# Patient Record
Sex: Female | Born: 1995 | Race: Black or African American | Hispanic: No | Marital: Single | Smoking: Former smoker
Health system: Southern US, Community
[De-identification: ages and names within clinical notes are randomized; demographics above are authoritative.]

## PROBLEM LIST (undated history)

## (undated) ENCOUNTER — Inpatient Hospital Stay (HOSPITAL_COMMUNITY): Payer: Self-pay

## (undated) DIAGNOSIS — J302 Other seasonal allergic rhinitis: Secondary | ICD-10-CM

## (undated) DIAGNOSIS — E0590 Thyrotoxicosis, unspecified without thyrotoxic crisis or storm: Secondary | ICD-10-CM

## (undated) DIAGNOSIS — L910 Hypertrophic scar: Secondary | ICD-10-CM

## (undated) DIAGNOSIS — R002 Palpitations: Secondary | ICD-10-CM

## (undated) DIAGNOSIS — F32A Depression, unspecified: Secondary | ICD-10-CM

## (undated) DIAGNOSIS — K9041 Non-celiac gluten sensitivity: Secondary | ICD-10-CM

## (undated) DIAGNOSIS — R339 Retention of urine, unspecified: Secondary | ICD-10-CM

## (undated) DIAGNOSIS — N390 Urinary tract infection, site not specified: Secondary | ICD-10-CM

## (undated) DIAGNOSIS — I491 Atrial premature depolarization: Secondary | ICD-10-CM

## (undated) DIAGNOSIS — E739 Lactose intolerance, unspecified: Secondary | ICD-10-CM

## (undated) DIAGNOSIS — R0602 Shortness of breath: Secondary | ICD-10-CM

## (undated) DIAGNOSIS — F329 Major depressive disorder, single episode, unspecified: Secondary | ICD-10-CM

## (undated) DIAGNOSIS — K5900 Constipation, unspecified: Secondary | ICD-10-CM

## (undated) DIAGNOSIS — E039 Hypothyroidism, unspecified: Secondary | ICD-10-CM

## (undated) DIAGNOSIS — I493 Ventricular premature depolarization: Secondary | ICD-10-CM

## (undated) HISTORY — DX: Atrial premature depolarization: I49.1

## (undated) HISTORY — DX: Shortness of breath: R06.02

## (undated) HISTORY — DX: Ventricular premature depolarization: I49.3

## (undated) HISTORY — DX: Retention of urine, unspecified: R33.9

## (undated) HISTORY — DX: Constipation, unspecified: K59.00

## (undated) HISTORY — PX: NO PAST SURGERIES: SHX2092

## (undated) HISTORY — DX: Palpitations: R00.2

---
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
## 2017-01-01 ENCOUNTER — Ambulatory Visit (INDEPENDENT_AMBULATORY_CARE_PROVIDER_SITE_OTHER): Payer: Medicaid Other | Admitting: Certified Nurse Midwife

## 2017-01-01 VITALS — BP 122/69 | HR 93 | Wt 180.0 lb

## 2017-01-01 DIAGNOSIS — Z3403 Encounter for supervision of normal first pregnancy, third trimester: Secondary | ICD-10-CM

## 2017-01-01 DIAGNOSIS — R7989 Other specified abnormal findings of blood chemistry: Secondary | ICD-10-CM

## 2017-01-01 DIAGNOSIS — Z3400 Encounter for supervision of normal first pregnancy, unspecified trimester: Secondary | ICD-10-CM

## 2017-01-01 NOTE — Progress Notes (Signed)
Patient reports she may have lost her mucus plug- otherwise things are normal

## 2017-01-01 NOTE — Progress Notes (Signed)
   PRENATAL VISIT NOTE  Subjective:  Ashley Sparks is a 21 y.o. G1P0000 at 100w2d being seen today for ongoing prenatal care.  She is currently monitored for the following issues for this low-risk pregnancy and has Lactose malabsorption; Supervision of normal pregnancy, antepartum; Low vitamin D level; Subclinical hyperthyroidism; PAC (premature atrial contraction); PVC (premature ventricular contraction); Maternal iron deficiency anemia affecting pregnancy in third trimester, antepartum; and Anemia complicating pregnancy in third trimester on her problem list.  Patient reports no complaints.  Contractions: Irregular. Vag. Bleeding: None.  Movement: Present. Denies leaking of fluid.   The following portions of the patient's history were reviewed and updated as appropriate: allergies, current medications, past family history, past medical history, past social history, past surgical history and problem list. Problem list updated.  Objective:   Vitals:   01/01/17 1008  BP: 122/69  Pulse: 93  Weight: 180 lb (81.6 kg)    Fetal Status: Fetal Heart Rate (bpm): 131 Fundal Height: 37 cm Movement: Present  Presentation: Vertex  General:  Alert, oriented and cooperative. Patient is in no acute distress.  Skin: Skin is warm and dry. No rash noted.   Cardiovascular: Normal heart rate noted  Respiratory: Normal respiratory effort, no problems with respiration noted  Abdomen: Soft, gravid, appropriate for gestational age.  Pain/Pressure: Present     Pelvic: Cervical exam performed Dilation: Fingertip Effacement (%): 50 Station: -3  Extremities: Normal range of motion.  Edema: None  Mental Status:  Normal mood and affect. Normal behavior. Normal judgment and thought content.   Assessment and Plan:  Pregnancy: G1P0000 at [redacted]w[redacted]d  1. Supervision of normal first pregnancy, antepartum     Doing well.   2. Low vitamin D level     Taking weekly vitamin D  Term labor symptoms and general obstetric  precautions including but not limited to vaginal bleeding, contractions, leaking of fluid and fetal movement were reviewed in detail with the patient. Please refer to After Visit Summary for other counseling recommendations.  Return in about 1 week (around 01/08/2017) for ROB.   Roe Coombs, CNM

## 2017-01-05 ENCOUNTER — Ambulatory Visit (INDEPENDENT_AMBULATORY_CARE_PROVIDER_SITE_OTHER): Payer: Medicaid Other | Admitting: Certified Nurse Midwife

## 2017-01-05 ENCOUNTER — Inpatient Hospital Stay (HOSPITAL_COMMUNITY)
Admission: AD | Admit: 2017-01-05 | Discharge: 2017-01-05 | Disposition: A | Discharge status: Home / Self Care | Payer: Medicaid Other | Source: Ambulatory Visit | Attending: Family Medicine | Admitting: Family Medicine

## 2017-01-05 ENCOUNTER — Encounter (HOSPITAL_COMMUNITY): Payer: Self-pay

## 2017-01-05 VITALS — BP 112/60 | HR 81 | Wt 180.8 lb

## 2017-01-05 DIAGNOSIS — Z3490 Encounter for supervision of normal pregnancy, unspecified, unspecified trimester: Secondary | ICD-10-CM | POA: Diagnosis not present

## 2017-01-05 DIAGNOSIS — Z3A00 Weeks of gestation of pregnancy not specified: Secondary | ICD-10-CM | POA: Insufficient documentation

## 2017-01-05 DIAGNOSIS — E559 Vitamin D deficiency, unspecified: Secondary | ICD-10-CM

## 2017-01-05 DIAGNOSIS — O99013 Anemia complicating pregnancy, third trimester: Secondary | ICD-10-CM

## 2017-01-05 DIAGNOSIS — Z3400 Encounter for supervision of normal first pregnancy, unspecified trimester: Secondary | ICD-10-CM

## 2017-01-05 DIAGNOSIS — D509 Iron deficiency anemia, unspecified: Secondary | ICD-10-CM

## 2017-01-05 DIAGNOSIS — O479 False labor, unspecified: Secondary | ICD-10-CM

## 2017-01-05 DIAGNOSIS — R7989 Other specified abnormal findings of blood chemistry: Secondary | ICD-10-CM

## 2017-01-05 LAB — POCT FERN TEST: POCT Fern Test: NEGATIVE

## 2017-01-05 NOTE — Discharge Instructions (Signed)

## 2017-01-05 NOTE — MAU Note (Signed)
CTX every 3-5 mins.  Some spotting since being checked in the office earlier.  VE 1 cm earlier.  No problems with the pregnancy.  Reports good fetal movement.  Felt pressure then gush when using restroom earlier.

## 2017-01-05 NOTE — MAU Note (Signed)
I have communicated with Dr. Frances Furbish and reviewed vital signs:  Vitals:   01/05/17 2124 01/05/17 2302  BP: 118/65 125/76  Pulse: 91 94  Resp: 19 17  Temp: 98 F (36.7 C)   SpO2:      Vaginal exam:  Dilation: 1 Effacement (%): 80 Cervical Position: Anterior Station: -1 Presentation: Vertex Exam by:: Corine Shelter,   Also reviewed contraction pattern and that non-stress test is reactive.  It has been documented that patient is contracting every 7-9 minutes with no cervical change over 1.5 hours not indicating active labor.  Patient denies any other complaints.  Based on this report provider has given order for discharge.  A discharge order and diagnosis entered by a provider.   Labor discharge instructions reviewed with patient.

## 2017-01-05 NOTE — Progress Notes (Signed)
   PRENATAL VISIT NOTE  Subjective:  Ashley Sparks is a 21 y.o. G1P0000 at [redacted]w[redacted]d being seen today for ongoing prenatal care.  She is currently monitored for the following issues for this low-risk pregnancy and has Lactose malabsorption; Supervision of normal pregnancy, antepartum; Low vitamin D level; Subclinical hyperthyroidism; PAC (premature atrial contraction); PVC (premature ventricular contraction); Maternal iron deficiency anemia affecting pregnancy in third trimester, antepartum; and Anemia complicating pregnancy in third trimester on her problem list.  Patient reports no bleeding, no leaking and occasional contractions.  Contractions: Irritability. Vag. Bleeding: None.  Movement: Present. Denies leaking of fluid.   The following portions of the patient's history were reviewed and updated as appropriate: allergies, current medications, past family history, past medical history, past social history, past surgical history and problem list. Problem list updated.  Objective:   Vitals:   01/05/17 0935  BP: 112/60  Pulse: 81  Weight: 180 lb 12.8 oz (82 kg)    Fetal Status: Fetal Heart Rate (bpm): 139 Fundal Height: 39 cm Movement: Present  Presentation: Vertex  General:  Alert, oriented and cooperative. Patient is in no acute distress.  Skin: Skin is warm and dry. No rash noted.   Cardiovascular: Normal heart rate noted  Respiratory: Normal respiratory effort, no problems with respiration noted  Abdomen: Soft, gravid, appropriate for gestational age.  Pain/Pressure: Present     Pelvic: Cervical exam performed Dilation: 1 Effacement (%): Thick Station: -3; psoterior, firm  Extremities: Normal range of motion.  Edema: None  Mental Status:  Normal mood and affect. Normal behavior. Normal judgment and thought content.   Assessment and Plan:  Pregnancy: G1P0000 at [redacted]w[redacted]d  1. Supervision of normal first pregnancy, antepartum       Doing well.   2. Low vitamin D level     Taking  weekly vitamin D.   3. Maternal iron deficiency anemia affecting pregnancy in third trimester, antepartum     Followed by hematology; s/p iron transfusion.   Term labor symptoms and general obstetric precautions including but not limited to vaginal bleeding, contractions, leaking of fluid and fetal movement were reviewed in detail with the patient. Please refer to After Visit Summary for other counseling recommendations.  Return in about 1 week (around 01/12/2017) for ROB.   Roe Coombs, CNM

## 2017-01-06 ENCOUNTER — Telehealth: Payer: Self-pay

## 2017-01-06 ENCOUNTER — Other Ambulatory Visit (HOSPITAL_BASED_OUTPATIENT_CLINIC_OR_DEPARTMENT_OTHER): Payer: Medicaid Other

## 2017-01-06 DIAGNOSIS — D509 Iron deficiency anemia, unspecified: Secondary | ICD-10-CM | POA: Diagnosis not present

## 2017-01-06 DIAGNOSIS — O99013 Anemia complicating pregnancy, third trimester: Secondary | ICD-10-CM | POA: Diagnosis not present

## 2017-01-06 LAB — CBC & DIFF AND RETIC
BASO%: 0.7 % (ref 0.0–2.0)
Basophils Absolute: 0.1 10*3/uL (ref 0.0–0.1)
EOS ABS: 0.2 10*3/uL (ref 0.0–0.5)
EOS%: 1.5 % (ref 0.0–7.0)
HEMATOCRIT: 36.8 % (ref 34.8–46.6)
HGB: 11.8 g/dL (ref 11.6–15.9)
IMMATURE RETIC FRACT: 18 % — AB (ref 1.60–10.00)
LYMPH#: 2.3 10*3/uL (ref 0.9–3.3)
LYMPH%: 22.7 % (ref 14.0–49.7)
MCH: 27.4 pg (ref 25.1–34.0)
MCHC: 32.2 g/dL (ref 31.5–36.0)
MCV: 85.2 fL (ref 79.5–101.0)
MONO#: 0.8 10*3/uL (ref 0.1–0.9)
MONO%: 7.5 % (ref 0.0–14.0)
NEUT#: 6.9 10*3/uL — ABNORMAL HIGH (ref 1.5–6.5)
NEUT%: 67.6 % (ref 38.4–76.8)
PLATELETS: 135 10*3/uL — AB (ref 145–400)
RBC: 4.31 10*6/uL (ref 3.70–5.45)
RDW: 30.1 % — ABNORMAL HIGH (ref 11.2–14.5)
RETIC CT ABS: 114.22 10*3/uL — AB (ref 33.70–90.70)
Retic %: 2.65 % — ABNORMAL HIGH (ref 0.70–2.10)
WBC: 10.2 10*3/uL (ref 3.9–10.3)

## 2017-01-06 LAB — IRON AND TIBC
%SAT: 20 % — ABNORMAL LOW (ref 21–57)
IRON: 83 ug/dL (ref 41–142)
TIBC: 420 ug/dL (ref 236–444)
UIBC: 337 ug/dL (ref 120–384)

## 2017-01-06 LAB — COMPREHENSIVE METABOLIC PANEL
ALT: 12 U/L (ref 0–55)
ANION GAP: 6 meq/L (ref 3–11)
AST: 18 U/L (ref 5–34)
Albumin: 2.6 g/dL — ABNORMAL LOW (ref 3.5–5.0)
Alkaline Phosphatase: 106 U/L (ref 40–150)
BILIRUBIN TOTAL: 0.3 mg/dL (ref 0.20–1.20)
BUN: 7.3 mg/dL (ref 7.0–26.0)
CALCIUM: 9 mg/dL (ref 8.4–10.4)
CO2: 20 meq/L — AB (ref 22–29)
CREATININE: 0.6 mg/dL (ref 0.6–1.1)
Chloride: 110 mEq/L — ABNORMAL HIGH (ref 98–109)
Glucose: 82 mg/dl (ref 70–140)
Potassium: 3.8 mEq/L (ref 3.5–5.1)
Sodium: 136 mEq/L (ref 136–145)
TOTAL PROTEIN: 6.2 g/dL — AB (ref 6.4–8.3)

## 2017-01-06 LAB — FERRITIN: Ferritin: 82 ng/ml (ref 9–269)

## 2017-01-06 NOTE — Telephone Encounter (Signed)
"  Franne GripLisa Buno NP with [facility].  My client, a mutual patient of Dr. Gweneth DimitriPerlov is [redacted] weeks pregnant.  Due for iron infusion the day after delivery.  Could deliver at any time within the next week or two.  Calling to let your office know in order to send staff [facility] to infuse the iron infusion.  We also need to know how to get iron [facility] in order for you to give it.  I can be reached [phone]."  [facility] not [facility] outside [facility].  This nurse consulted with infusion Charge Nurse.  [facility] policy is for pregnant patients in need of iron infusions are scheduled here, in a bed with monitoring during infusion.  Spoke with collaborative nurse with this information.  This nurse has communicated with Franne GripLisa Buno on yesterday.  No further communication needed or provided by this triage nurse.

## 2017-01-06 NOTE — Telephone Encounter (Signed)
VM was left with our office questioning the protocol about IV iron infusion for pt after giving birth. "Would you be sending a nurse here to assist with infusion?"   Called facility back and said that if her OBGYN wants her to have iron after giving birth that will need to be ordered, administered, and monitored through [facility]. We do not send nurses to other facilities and only administer IV iron at [facility] because of the liability and risk of reaction. IV iron is okay to be administered at [facility] under the authority of the pt's physician there.   [facility] verbalized understanding. If pt continues to require IV iron in the future, we can set her up for f/u and infusion appt here.

## 2017-01-08 ENCOUNTER — Inpatient Hospital Stay (HOSPITAL_COMMUNITY)
Admission: AD | Admit: 2017-01-08 | Discharge: 2017-01-08 | Disposition: A | Discharge status: Home / Self Care | Payer: Medicaid Other | Source: Ambulatory Visit | Attending: Obstetrics & Gynecology | Admitting: Obstetrics & Gynecology

## 2017-01-08 ENCOUNTER — Telehealth: Payer: Self-pay | Admitting: Hematology and Oncology

## 2017-01-08 ENCOUNTER — Encounter (HOSPITAL_COMMUNITY): Payer: Self-pay | Admitting: *Deleted

## 2017-01-08 DIAGNOSIS — O471 False labor at or after 37 completed weeks of gestation: Secondary | ICD-10-CM | POA: Insufficient documentation

## 2017-01-08 DIAGNOSIS — Z3400 Encounter for supervision of normal first pregnancy, unspecified trimester: Secondary | ICD-10-CM

## 2017-01-08 DIAGNOSIS — Z3A39 39 weeks gestation of pregnancy: Secondary | ICD-10-CM | POA: Diagnosis not present

## 2017-01-08 DIAGNOSIS — O479 False labor, unspecified: Secondary | ICD-10-CM

## 2017-01-08 NOTE — Telephone Encounter (Signed)
Left message for patient regarding update in September appointment.

## 2017-01-08 NOTE — MAU Note (Signed)
[facility] .  VE   IN MAU -   2-3   CM.   STRONG UC  SINCE 2300.    DENIES HSV AND  MRSA.  GBS- NEG

## 2017-01-12 ENCOUNTER — Encounter (HOSPITAL_COMMUNITY): Payer: Self-pay

## 2017-01-12 ENCOUNTER — Inpatient Hospital Stay (HOSPITAL_COMMUNITY): Payer: Medicaid Other | Admitting: Anesthesiology

## 2017-01-12 ENCOUNTER — Ambulatory Visit: Payer: Medicaid Other

## 2017-01-12 ENCOUNTER — Ambulatory Visit: Payer: Medicaid Other | Admitting: Hematology and Oncology

## 2017-01-12 ENCOUNTER — Inpatient Hospital Stay (HOSPITAL_COMMUNITY)
Admission: AD | Admit: 2017-01-12 | Discharge: 2017-01-14 | DRG: 775 | Disposition: A | Discharge status: Home / Self Care | Payer: Medicaid Other | Source: Ambulatory Visit | Attending: Family Medicine | Admitting: Family Medicine

## 2017-01-12 DIAGNOSIS — Z3A39 39 weeks gestation of pregnancy: Secondary | ICD-10-CM

## 2017-01-12 DIAGNOSIS — O99214 Obesity complicating childbirth: Secondary | ICD-10-CM | POA: Diagnosis present

## 2017-01-12 DIAGNOSIS — Z68.41 Body mass index (BMI) pediatric, 5th percentile to less than 85th percentile for age: Secondary | ICD-10-CM | POA: Diagnosis not present

## 2017-01-12 DIAGNOSIS — D509 Iron deficiency anemia, unspecified: Secondary | ICD-10-CM | POA: Diagnosis present

## 2017-01-12 DIAGNOSIS — E669 Obesity, unspecified: Secondary | ICD-10-CM | POA: Diagnosis present

## 2017-01-12 DIAGNOSIS — O9902 Anemia complicating childbirth: Secondary | ICD-10-CM | POA: Diagnosis present

## 2017-01-12 LAB — TYPE AND SCREEN
ABO/RH(D): A POS
Antibody Screen: NEGATIVE

## 2017-01-12 LAB — CBC
HCT: 37.7 % (ref 36.0–46.0)
Hemoglobin: 12.4 g/dL (ref 12.0–15.0)
MCH: 27.8 pg (ref 26.0–34.0)
MCHC: 32.9 g/dL (ref 30.0–36.0)
MCV: 84.5 fL (ref 78.0–100.0)
PLATELETS: 155 10*3/uL (ref 150–400)
RBC: 4.46 MIL/uL (ref 3.87–5.11)
WBC: 18.3 10*3/uL — AB (ref 4.0–10.5)

## 2017-01-12 LAB — RPR: RPR: NONREACTIVE

## 2017-01-12 LAB — ABO/RH: ABO/RH(D): A POS

## 2017-01-12 MED ORDER — LACTATED RINGERS IV SOLN
INTRAVENOUS | Status: DC
  Administered 2017-01-12 (×3): via INTRAVENOUS

## 2017-01-12 MED ORDER — EPHEDRINE 5 MG/ML INJ
10.0000 mg | INTRAVENOUS | Status: DC | PRN
  Filled 2017-01-12: qty 2

## 2017-01-12 MED ORDER — OXYTOCIN 40 UNITS IN LACTATED RINGERS INFUSION - SIMPLE MED
2.5000 [IU]/h | INTRAVENOUS | Status: DC
  Filled 2017-01-12: qty 1000

## 2017-01-12 MED ORDER — PHENYLEPHRINE 40 MCG/ML (10ML) SYRINGE FOR IV PUSH (FOR BLOOD PRESSURE SUPPORT)
80.0000 ug | PREFILLED_SYRINGE | INTRAVENOUS | Status: DC | PRN
  Administered 2017-01-12 (×2): 80 ug via INTRAVENOUS
  Filled 2017-01-12: qty 5
  Filled 2017-01-12: qty 10

## 2017-01-12 MED ORDER — FENTANYL 2.5 MCG/ML BUPIVACAINE 1/10 % EPIDURAL INFUSION (WH - ANES)
14.0000 mL/h | INTRAMUSCULAR | Status: DC | PRN
  Administered 2017-01-12: 14 mL/h via EPIDURAL
  Administered 2017-01-12: 11 mL/h via EPIDURAL
  Filled 2017-01-12 (×2): qty 100

## 2017-01-12 MED ORDER — SOD CITRATE-CITRIC ACID 500-334 MG/5ML PO SOLN: 30.0000 mL | ORAL | Status: DC | PRN

## 2017-01-12 MED ORDER — OXYCODONE-ACETAMINOPHEN 5-325 MG PO TABS
1.0000 | ORAL_TABLET | ORAL | Status: DC | PRN
  Administered 2017-01-14: 1 via ORAL
  Filled 2017-01-12: qty 1

## 2017-01-12 MED ORDER — LACTATED RINGERS IV SOLN
500.0000 mL | Freq: Once | INTRAVENOUS | Status: AC
  Administered 2017-01-12: 500 mL via INTRAVENOUS

## 2017-01-12 MED ORDER — IBUPROFEN 600 MG PO TABS
600.0000 mg | ORAL_TABLET | Freq: Four times a day (QID) | ORAL | Status: DC
  Administered 2017-01-12 – 2017-01-14 (×7): 600 mg via ORAL
  Filled 2017-01-12 (×7): qty 1

## 2017-01-12 MED ORDER — OXYCODONE-ACETAMINOPHEN 5-325 MG PO TABS
2.0000 | ORAL_TABLET | ORAL | Status: DC | PRN
Start: 2017-01-12 — End: 2017-01-14

## 2017-01-12 MED ORDER — LIDOCAINE HCL (PF) 1 % IJ SOLN
30.0000 mL | INTRAMUSCULAR | Status: DC | PRN
  Administered 2017-01-12: 30 mL via SUBCUTANEOUS
  Filled 2017-01-12: qty 30

## 2017-01-12 MED ORDER — LIDOCAINE HCL (PF) 1 % IJ SOLN
INTRAMUSCULAR | Status: DC | PRN
  Administered 2017-01-12: 4 mL via EPIDURAL
  Administered 2017-01-12: 3 mL via EPIDURAL

## 2017-01-12 MED ORDER — OXYTOCIN BOLUS FROM INFUSION
500.0000 mL | Freq: Once | INTRAVENOUS | Status: AC
  Administered 2017-01-12: 500 mL via INTRAVENOUS

## 2017-01-12 MED ORDER — LACTATED RINGERS IV SOLN
500.0000 mL | INTRAVENOUS | Status: DC | PRN
  Administered 2017-01-12: 500 mL via INTRAVENOUS

## 2017-01-12 MED ORDER — OXYTOCIN 40 UNITS IN LACTATED RINGERS INFUSION - SIMPLE MED
1.0000 m[IU]/min | INTRAVENOUS | Status: DC
  Administered 2017-01-12: 2 m[IU]/min via INTRAVENOUS

## 2017-01-12 MED ORDER — TERBUTALINE SULFATE 1 MG/ML IJ SOLN
0.2500 mg | Freq: Once | INTRAMUSCULAR | Status: DC | PRN
  Filled 2017-01-12: qty 1

## 2017-01-12 MED ORDER — DIPHENHYDRAMINE HCL 50 MG/ML IJ SOLN: 12.5000 mg | INTRAMUSCULAR | Status: DC | PRN

## 2017-01-12 MED ORDER — PHENYLEPHRINE 40 MCG/ML (10ML) SYRINGE FOR IV PUSH (FOR BLOOD PRESSURE SUPPORT)
80.0000 ug | PREFILLED_SYRINGE | INTRAVENOUS | Status: DC | PRN
Start: 2017-01-12 — End: 2017-01-14
  Filled 2017-01-12: qty 10
  Filled 2017-01-12: qty 5

## 2017-01-12 MED ORDER — ACETAMINOPHEN 325 MG PO TABS
650.0000 mg | ORAL_TABLET | ORAL | Status: DC | PRN
  Administered 2017-01-13: 650 mg via ORAL
  Filled 2017-01-12: qty 2

## 2017-01-12 MED ORDER — ONDANSETRON HCL 4 MG/2ML IJ SOLN
4.0000 mg | Freq: Four times a day (QID) | INTRAMUSCULAR | Status: DC | PRN
  Administered 2017-01-12: 4 mg via INTRAVENOUS
  Filled 2017-01-12: qty 2

## 2017-01-12 MED ORDER — FENTANYL CITRATE (PF) 100 MCG/2ML IJ SOLN
100.0000 ug | INTRAMUSCULAR | Status: DC | PRN
  Administered 2017-01-12 (×2): 100 ug via INTRAVENOUS
  Filled 2017-01-12 (×2): qty 2

## 2017-01-12 NOTE — Progress Notes (Signed)
Patient ID: Lyndal PulleyCierra Spainhower, female   DOB: 02-18-1996, 21 y.o.   MRN: [id number] Feeling pressure Vitals:   01/12/17 1930 01/12/17 2000 01/12/17 2033 01/12/17 2100  BP: 115/63 (!) 79/66 110/78 110/65  Pulse: (!) 107 (!) 113 98 (!) 197  Resp: 16     Temp: 98.1 F (36.7 C)     TempSrc: Oral     SpO2:      Weight:      Height:       FHR stable  Dilation: 10 Dilation Complete Date: 01/12/17 Dilation Complete Time: 2116 Effacement (%): 100 Station: +2 Presentation: Vertex Exam by:: Viona GilmoreS Moyer  Second stage of labor Anticipate SVD

## 2017-01-12 NOTE — MAU Note (Signed)
Pt presents to MAU via EMS c/o ctxs that are apart. Pt denies bleeding or LOF. Pt reports baby was moving a little less than normal but she has felt her move 2times within the last hour now.

## 2017-01-12 NOTE — Anesthesia Preprocedure Evaluation (Signed)
Anesthesia Evaluation  Patient identified by MRN, date of birth, ID band Patient awake    Reviewed: Allergy & Precautions, Patient's Chart, lab work & pertinent test results  Airway Mallampati: III  TM Distance: >3 FB Neck ROM: Full    Dental no notable dental hx. (+) Teeth Intact   Pulmonary shortness of breath and with exertion,    Pulmonary exam normal breath sounds clear to auscultation       Cardiovascular negative cardio ROS Normal cardiovascular exam Rhythm:Regular Rate:Normal     Neuro/Psych PSYCHIATRIC DISORDERS Depression negative neurological ROS     GI/Hepatic Neg liver ROS, GERD  ,  Endo/Other  Hypothyroidism Hyperthyroidism Obesity  Renal/GU negative Renal ROS  negative genitourinary   Musculoskeletal negative musculoskeletal ROS (+)   Abdominal (+) + obese,   Peds  Hematology  (+) anemia ,   Anesthesia Other Findings   Reproductive/Obstetrics (+) Pregnancy                             Anesthesia Physical Anesthesia Plan  ASA: II  Anesthesia Plan: Epidural   Post-op Pain Management:    Induction:   PONV Risk Score and Plan:   Airway Management Planned: Natural Airway  Additional Equipment:   Intra-op Plan:   Post-operative Plan:   Informed Consent: I have reviewed the patients History and Physical, chart, labs and discussed the procedure including the risks, benefits and alternatives for the proposed anesthesia with the patient or authorized representative who has indicated his/her understanding and acceptance.     Plan Discussed with: Anesthesiologist  Anesthesia Plan Comments:         Anesthesia Quick Evaluation

## 2017-01-12 NOTE — Progress Notes (Signed)
Ashley PulleyCierra Sparks is a 21 y.o. G1P0000 at 3510w6d by admitted for latent labor and prolonged decel in MAU prior to admission.  Subjective: Doing well.  S/p epidural and comfortable.  Family at bedside for support.   Objective: BP (!) 111/52   Pulse 89   Temp 98.3 F (36.8 C) (Oral)   Resp 20   Ht 5\' 1"  (1.549 m)   Wt 83.9 kg (185 lb)   LMP 04/06/2016   SpO2 99%   BMI 34.96 kg/m  No intake/output data recorded. Total I/O In: -  Out: 700 [Urine:700]  FHT:  FHR: 120 bpm, variability: moderate,  accelerations:  Present,  decelerations:  Absent UC:   regular, every 3-4 minutes SVE:   Dilation: 2.5 Effacement (%): 90 Station: -1 Exam by:: stinson, Nery Frappier  Labs: Lab Results  Component Value Date   WBC 18.3 (H) 01/12/2017   HGB 12.4 01/12/2017   HCT 37.7 01/12/2017   MCV 84.5 01/12/2017   PLT 155 01/12/2017    Assessment / Plan: Latent labor with no progression x4 hours  Labor: Initiate pitocin augmentation Preeclampsia:  N/A Fetal Wellbeing:  Category I Pain Control:  Epidural I/D:  n/a Anticipated MOD:  NSVD  Jearld LeschMary K Isabelly Kobler 01/12/2017, 3:03 PM

## 2017-01-12 NOTE — Progress Notes (Signed)
Ashley PulleyCierra Sparks is a 21 y.o. G1P0000 at 4945w6d admitted for latent labor with NRFHT  Subjective: Contractions painful - fentanyl helps  Objective: BP 122/80 (BP Location: Right Arm)   Pulse (!) 108   Temp 98.1 F (36.7 C) (Oral)   Resp 20   Ht 5\' 1"  (1.549 m)   Wt 185 lb (83.9 kg)   LMP 04/06/2016   SpO2 99%   BMI 34.96 kg/m  No intake/output data recorded. No intake/output data recorded.  FHT:  FHR: 140s bpm, variability: moderate,  accelerations:  Abscent,  decelerations:  Present variable UC:   irregular, every 3-5 minutes SVE:   Dilation: 4 Effacement (%): 90 Station: -1 Exam by:: Key MD  Labs: Lab Results  Component Value Date   WBC 18.3 (H) 01/12/2017   HGB 12.4 01/12/2017   HCT 37.7 01/12/2017   MCV 84.5 01/12/2017   PLT 155 01/12/2017    Assessment / Plan: Spontaneous labor, progressing normally, epidural when desired  Labor: Progressing normally Fetal Wellbeing:  Category II Pain Control:  IV pain meds I/D:  n/a Anticipated MOD:  NSVD  Levie HeritageJacob J Stinson 01/12/2017, 9:49 AM

## 2017-01-12 NOTE — Progress Notes (Signed)
Patient ID: Lyndal PulleyCierra Stgermain, female   DOB: 04-18-1996, 21 y.o.   MRN: [id number] Appears comfortable Vitals:   01/12/17 1900 01/12/17 1930 01/12/17 2000 01/12/17 2033  BP: 120/67 115/63 (!) 79/66 110/78  Pulse: 95 (!) 107 (!) 113 98  Resp:  16    Temp:  98.1 F (36.7 C)    TempSrc:  Oral    SpO2:      Weight:      Height:       FHR stable and reassuring UCs every 2-3 min Dilation: 5 Effacement (%): 90 Station: 0 Presentation: Vertex Exam by:: middleton rn  Continue to observe

## 2017-01-12 NOTE — Anesthesia Procedure Notes (Signed)
Epidural Patient location during procedure: OB Start time: 01/12/2017 10:29 AM  Staffing Anesthesiologist: Mal AmabileFOSTER, Fedrick Cefalu Performed: anesthesiologist   Preanesthetic Checklist Completed: patient identified, site marked, surgical consent, pre-op evaluation, timeout performed, IV checked, risks and benefits discussed and monitors and equipment checked  Epidural Patient position: sitting Prep: site prepped and draped and DuraPrep Patient monitoring: continuous pulse ox and blood pressure Approach: midline Location: L4-L5 Injection technique: LOR air  Needle:  Needle type: Tuohy  Needle gauge: 17 G Needle length: 9 cm and 9 Needle insertion depth: 5 cm cm Catheter type: closed end flexible Catheter size: 19 Gauge Catheter at skin depth: 10 cm Test dose: negative and Other  Assessment Events: blood not aspirated, injection not painful, no injection resistance, negative IV test and no paresthesia  Additional Notes Patient identified. Risks and benefits discussed including failed block, incomplete  Pain control, post dural puncture headache, nerve damage, paralysis, blood pressure Changes, nausea, vomiting, reactions to medications-both toxic and allergic and post Partum back pain. All questions were answered. Patient expressed understanding and wished to proceed. Sterile technique was used throughout procedure. Epidural site was Dressed with sterile barrier dressing. No paresthesias, signs of intravascular injection Or signs of intrathecal spread were encountered. Attempt x 2 Patient was more comfortable after the epidural was dosed. Please see RN's note for documentation of vital signs and FHR which are stable.

## 2017-01-12 NOTE — Progress Notes (Signed)
Ashley PulleyCierra Sparks is a 21 y.o. G1P0000 at 2567w6d by admitted for latent labor and prolonged decel in MAU prior to admission.  Subjective: Doing well.  Feeling pressure with contractions; pain controlled with epidural.  Family at bedside.  No concerns.  Objective: BP (!) 111/59   Pulse 84   Temp 98.3 F (36.8 C) (Oral)   Resp 20   Ht 5\' 1"  (1.549 m)   Wt 83.9 kg (185 lb)   LMP 04/06/2016   SpO2 99%   BMI 34.96 kg/m  No intake/output data recorded. Total I/O In: -  Out: 1050 [Urine:1050]  FHT:  Baseline 125, moderate variability, +accels, mild variable decels x2  UC:   regular, every 3 minutes SVE:   Dilation: 5 Effacement (%): 90 Station: 0 Exam by:: [employer]middleton rn  Labs: Lab Results  Component Value Date   WBC 18.3 (H) 01/12/2017   HGB 12.4 01/12/2017   HCT 37.7 01/12/2017   MCV 84.5 01/12/2017   PLT 155 01/12/2017    Assessment / Plan: Spontaneous labor, progressing normally  Labor: Progressing on pitocin, AROM performed with return of light meconium fluid, minimal amount of blood on cervical check likely associated with continued cervical change Preeclampsia:  N/A Fetal Wellbeing:  Category 2 Pain Control:  Epidural I/D:  n/a Anticipated MOD:  NSVD  Ashley Sparks 01/12/2017, 6:54 PM

## 2017-01-12 NOTE — H&P (Signed)
Obstetric History and Physical  Ashley Sparks is a 21 y.o. G1P0000 with IUP at [redacted]w[redacted]d presenting for latent labor with deep prolonged decel on MAU monitoring. Patient uncomfortable with contractions presenting every 2-60min. Contractions started at 01:30. Patient states she has been having  regular contractions, none vaginal bleeding, intact membranes, with active fetal movement.    Prenatal Course Source of Care: [place]  with onset of care at 10w weeks Dating: By [place] --->  Estimated Date of Delivery: 01/13/17 Pregnancy complications or risks: Patient Active Problem List   Diagnosis Date Noted  . Anemia complicating pregnancy in third trimester 11/16/2016  . Maternal iron deficiency anemia affecting pregnancy in third trimester, antepartum 10/28/2016  . PAC (premature atrial contraction) 08/24/2016  . PVC (premature ventricular contraction) 08/24/2016  . 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 Low vitamin D level 06/24/2016  . Supervision of normal pregnancy, antepartum 06/22/2016  . 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   Prenatal labs and studies: ABO, Rh: A/Positive/-- (02/12 1049) Antibody: Negative (02/12 1049) Rubella: 3.73 (02/12 1049) RPR: Non Reactive (06/15 1036)  HBsAg: Negative (02/12 1049)  HIV:   Non-Reactive WUJ:WJXBJYNW (08/09 0917) Glucola  normal Genetic screening normal Anatomy US normal  Prenatal Transfer Tool  Maternal Diabetes: No Genetic Screening: Normal Maternal Ultrasounds/Referrals: Normal Fetal Ultrasounds or other Referrals:  None Maternal Substance Abuse:  No Significant Maternal Medications:  None Significant Maternal Lab Results: None  Past Medical History:  Diagnosis Date  . Constipation   . Depression   . Gluten intolerance   . Hypothyroid   . Keloid   . Lactose intolerance   . PAC (premature atrial contraction) 08/24/2016  . Palpitations 07/01/2016  . PVC  (premature ventricular contraction) 08/24/2016  . Seasonal allergies   . Shortness of breath 07/01/2016   typically during allergy season  . UTI (urinary tract infection)     History reviewed. No pertinent surgical history.  OB History  Gravida Para Term Preterm AB Living  1 0 0 0 0 0  SAB TAB Ectopic Multiple Live Births  0 0 0 0 0    # Outcome Date GA Lbr Len/2nd Weight Sex Delivery Anes PTL Lv  1 Current               Social History   Social History  . Marital status: Single    Spouse name: N/A  . Number of children: N/A  . Years of education: N/A   Social History Main Topics  . Smoking status: Never Smoker  . Smokeless tobacco: Never Used  . Alcohol use No  . Drug use: No  . Sexual activity: Yes   Other Topics Concern  . None   Social History Narrative   10th grade (online)    Family History  Problem Relation Age of Onset  . Adopted: Yes  . Diabetes Mother   . Seizures Mother   . Celiac disease Neg Hx   . Cholelithiasis Neg Hx   . Ulcers Neg Hx     Prescriptions Prior to Admission  Medication Sig Dispense Refill Last Dose  . acetaminophen (TYLENOL) 325 MG tablet Take 650 mg by mouth every 6 (six) hours as needed for mild pain or headache.   01/12/2017 at Unknown time  . Prenatal-DSS-FeCb-FeGl-FA (CITRANATAL BLOOM) 90-1 MG TABS Take 1 tablet by mouth daily. 30 tablet 12 01/12/2017 at Unknown time  . promethazine (PHENERGAN) 25 MG tablet Take 12.5-25 mg by mouth every 6 (  six) hours as needed for nausea.    01/12/2017 at Unknown time  . albuterol (PROVENTIL HFA;VENTOLIN HFA) 108 (90 Base) MCG/ACT inhaler Inhale 1-2 puffs into the lungs every 6 (six) hours as needed for wheezing or shortness of breath.   Unknown at Unknown time  . metroNIDAZOLE (FLAGYL) 500 MG tablet Take 1 tablet (500 mg total) by mouth 2 (two) times daily. 14 tablet 0 Unknown at Unknown time  . terconazole (TERAZOL 3) 0.8 % vaginal cream Place 1 applicator vaginally at bedtime. (Patient not  taking: Reported on 01/05/2017) 20 g 0 Not Taking at Unknown time    Allergies  Allergen Reactions  . Lactose Intolerance (Gi) Diarrhea and Other (See Comments)    Gi upset   . Gluten Meal Hives and Diarrhea    And abdominal pain  . Macrobid [Nitrofurantoin Monohyd Macro] Hives and Rash    Review of Systems: Negative except for what is mentioned in HPI.  Physical Exam: BP 138/72 (BP Location: Left Arm)   Pulse 96   Temp 98.2 F (36.8 C) (Oral)   Resp (!) 22   Ht 5\' 1"  (1.549 m)   Wt 185 lb (83.9 kg)   LMP 04/06/2016   SpO2 99%   BMI 34.96 kg/m  CONSTITUTIONAL: Well-developed, well-nourished female in no acute distress.  HENT:  Normocephalic, atraumatic, External right and left ear normal. Oropharynx is clear and moist EYES: Conjunctivae and EOM are normal. Pupils are equal, round, and reactive to light. No scleral icterus.  NECK: Normal range of motion, supple, no masses SKIN: Skin is warm and dry. No rash noted. Not diaphoretic. No erythema. No pallor. NEUROLOGIC: Alert and oriented to person, place, and time. Normal reflexes, muscle tone coordination. No cranial nerve deficit noted. PSYCHIATRIC: Normal mood and affect. Normal behavior. Normal judgment and thought content. CARDIOVASCULAR: Normal heart rate noted, regular rhythm RESPIRATORY: Effort and breath sounds normal, no problems with respiration noted ABDOMEN: Soft, nontender, nondistended, gravid. MUSCULOSKELETAL: Normal range of motion. No edema and no tenderness. 2+ distal pulses.  Dilation: 1.5 Effacement (%): 90 Station: -1 Presentation: Vertex Exam by:: middleton rn  FHT:  Baseline rate 130 bpm   Variability moderate  Accelerations present   Decelerations none Contractions: Every 2-4 mins   Pertinent Labs/Studies:   Results for orders placed or performed during the hospital encounter of 01/12/17 (from the past 24 hour(s))  CBC     Status: Abnormal   Collection Time: 01/12/17  6:50 AM  Result Value Ref  Range   WBC 18.3 (H) 4.0 - 10.5 K/uL   RBC 4.46 3.87 - 5.11 MIL/uL   Hemoglobin 12.4 12.0 - 15.0 g/dL   HCT 09.837.7 11.936.0 - 14.746.0 %   MCV 84.5 78.0 - 100.0 fL   MCH 27.8 26.0 - 34.0 pg   MCHC 32.9 30.0 - 36.0 g/dL   RDW NOT CALCULATED 82.911.5 - 15.5 %   Platelets 155 150 - 400 K/uL  Type and screen [facility]     Status: None   Collection Time: 01/12/17  6:50 AM  Result Value Ref Range   ABO/RH(D) A POS    Antibody Screen NEG    Sample Expiration 01/15/2017   ABO/Rh     Status: None   Collection Time: 01/12/17  6:50 AM  Result Value Ref Range   ABO/RH(D) A POS     Assessment : Ashley PulleyCierra Sparks is a 21 y.o. G1P0000 at 338w6d being admitted for latent labor and NRFHT.  Plan: Labor:  Expectant management. Induction/Augmentation as ordered as per protocol. Analgesia as needed. FWB: Reassuring fetal heart tracing now, category 1.  GBS negative Delivery plan: Hopeful for vaginal delivery   Caryl Ada, DO OB Fellow Faculty Practice, [facility] 01/12/2017, 6:33 AM

## 2017-01-12 NOTE — Anesthesia Pain Management Evaluation Note (Signed)
  CRNA Pain Management Visit Note  Patient: Ashley PulleyCierra Sparks, 21 y.o., female  "Hello I am a member of the anesthesia team at [facility]. We have an anesthesia team available at all times to provide care throughout the hospital, including epidural management and anesthesia for C-section. I don't know your plan for the delivery whether it a natural birth, water birth, IV sedation, nitrous supplementation, doula or epidural, but we want to meet your pain goals."   1.Was your pain managed to your expectations on prior hospitalizations?   No prior hospitalizations  2.What is your expectation for pain management during this hospitalization?     Epidural  3.How can we help you reach that goal? unsure  Record the patient's initial score and the patient's pain goal.   Pain: 10  Pain Goal: 10 [facility] wants you to be able to say your pain was always managed very well.  Cephus ShellingBURGER,[name] 01/12/2017

## 2017-01-13 LAB — CBC
HEMATOCRIT: 33 % — AB (ref 36.0–46.0)
HEMOGLOBIN: 10.8 g/dL — AB (ref 12.0–15.0)
MCH: 27.8 pg (ref 26.0–34.0)
MCHC: 32.7 g/dL (ref 30.0–36.0)
MCV: 84.8 fL (ref 78.0–100.0)
Platelets: 140 10*3/uL — ABNORMAL LOW (ref 150–400)
RBC: 3.89 MIL/uL (ref 3.87–5.11)
WBC: 21.1 10*3/uL — ABNORMAL HIGH (ref 4.0–10.5)

## 2017-01-13 MED ORDER — PRENATAL MULTIVITAMIN CH: 1.0000 | ORAL_TABLET | Freq: Every day | ORAL | Status: DC

## 2017-01-13 MED ORDER — LIDOCAINE HCL 1 % IJ SOLN
0.0000 mL | Freq: Once | INTRAMUSCULAR | Status: DC | PRN
  Filled 2017-01-13: qty 20

## 2017-01-13 MED ORDER — ACETAMINOPHEN 325 MG PO TABS: 650.0000 mg | ORAL_TABLET | ORAL | Status: DC | PRN

## 2017-01-13 MED ORDER — DIBUCAINE 1 % RE OINT: 1.0000 "application " | TOPICAL_OINTMENT | RECTAL | Status: DC | PRN

## 2017-01-13 MED ORDER — ETONOGESTREL 68 MG ~~LOC~~ IMPL
68.0000 mg | DRUG_IMPLANT | Freq: Once | SUBCUTANEOUS | Status: AC
  Administered 2017-01-14: 68 mg via SUBCUTANEOUS
  Filled 2017-01-13: qty 1

## 2017-01-13 MED ORDER — ONDANSETRON HCL 4 MG PO TABS: 4.0000 mg | ORAL_TABLET | ORAL | Status: DC | PRN

## 2017-01-13 MED ORDER — ZOLPIDEM TARTRATE 5 MG PO TABS: 5.0000 mg | ORAL_TABLET | Freq: Every evening | ORAL | Status: DC | PRN

## 2017-01-13 MED ORDER — ONDANSETRON HCL 4 MG/2ML IJ SOLN: 4.0000 mg | INTRAMUSCULAR | Status: DC | PRN

## 2017-01-13 MED ORDER — TETANUS-DIPHTH-ACELL PERTUSSIS 5-2.5-18.5 LF-MCG/0.5 IM SUSP: 0.5000 mL | Freq: Once | INTRAMUSCULAR | Status: DC

## 2017-01-13 MED ORDER — SENNOSIDES-DOCUSATE SODIUM 8.6-50 MG PO TABS
2.0000 | ORAL_TABLET | ORAL | Status: DC
  Administered 2017-01-14: 2 via ORAL
  Filled 2017-01-13: qty 2

## 2017-01-13 MED ORDER — SIMETHICONE 80 MG PO CHEW: 80.0000 mg | CHEWABLE_TABLET | ORAL | Status: DC | PRN

## 2017-01-13 MED ORDER — OXYCODONE-ACETAMINOPHEN 5-325 MG PO TABS
1.0000 | ORAL_TABLET | ORAL | Status: DC | PRN
  Administered 2017-01-13: 1 via ORAL
  Filled 2017-01-13: qty 1

## 2017-01-13 MED ORDER — DIPHENHYDRAMINE HCL 25 MG PO CAPS: 25.0000 mg | ORAL_CAPSULE | Freq: Four times a day (QID) | ORAL | Status: DC | PRN

## 2017-01-13 MED ORDER — OXYCODONE-ACETAMINOPHEN 5-325 MG PO TABS: 2.0000 | ORAL_TABLET | ORAL | Status: DC | PRN

## 2017-01-13 MED ORDER — BENZOCAINE-MENTHOL 20-0.5 % EX AERO
1.0000 "application " | INHALATION_SPRAY | CUTANEOUS | Status: DC | PRN
  Administered 2017-01-13: 1 via TOPICAL
  Filled 2017-01-13: qty 56

## 2017-01-13 MED ORDER — COCONUT OIL OIL
1.0000 "application " | TOPICAL_OIL | Status: DC | PRN
  Administered 2017-01-13: 1 via TOPICAL
  Filled 2017-01-13: qty 120

## 2017-01-13 MED ORDER — WITCH HAZEL-GLYCERIN EX PADS: 1.0000 "application " | MEDICATED_PAD | CUTANEOUS | Status: DC | PRN

## 2017-01-13 NOTE — Lactation Note (Signed)
This note was copied from a baby's chart. Lactation Consultation Note  Patient Name: Ashley Lyndal PulleyCierra Lazare ZOXWR'UToday's Date: 01/13/2017 Reason for consult: Initial assessment   Baby 16 hours old.  Baby sleeping in mother's arms while mother on the phone.  FOB stated baby recently breastfed. Offered to come back later and mother did not respond, remained on the phone. Lactation will follow up later.   Maternal Data    Feeding    LATCH Score                   Interventions    Lactation Tools Discussed/Used     Consult Status Consult Status: Follow-up Date: 01/13/17 Follow-up type: In-patient    Dahlia ByesBerkelhammer, Xandria Gallaga [facility] 01/13/2017, 2:28 PM

## 2017-01-13 NOTE — Lactation Note (Signed)
This note was copied from a baby's chart. Lactation Consultation Note  Patient Name: Girl Lyndal PulleyCierra Propps Today's Date: 01/13/2017 Reason for consult: Initial assessment Baby at 14 hr of life. [facility] and then her lunch was delivered. Left handouts and instructions for mom to call for lactation at the next feeding.   Maternal Data    Feeding Feeding Type: Breast Fed  LATCH Score Latch: Grasps breast easily, tongue down, lips flanged, rhythmical sucking.  Audible Swallowing: Spontaneous and intermittent  Type of Nipple: Everted at rest and after stimulation  Comfort (Breast/Nipple): Soft / non-tender  Hold (Positioning): No assistance needed to correctly position infant at breast.  LATCH Score: 10  Interventions    Lactation Tools Discussed/Used     Consult Status Consult Status: Follow-up Date: 01/13/17 Follow-up type: In-patient    Rulon Eisenmengerlizabeth E Rudie Sermons 01/13/2017, 12:27 PM

## 2017-01-13 NOTE — Progress Notes (Signed)
Post Partum Day 1 Subjective: no complaints, up ad lib, voiding and tolerating PO  Objective: Blood pressure (!) 107/46, pulse 79, temperature 98.6 F (37 C), temperature source Oral, resp. rate 18, height 5\' 1"  (1.549 m), weight 185 lb (83.9 kg), last menstrual period 04/06/2016, SpO2 99 %, unknown if currently breastfeeding.  Physical Exam:  General: alert, cooperative and no distress Lochia: appropriate Uterine Fundus: firm Incision: n/a  DVT Evaluation: No evidence of DVT seen on physical exam.   Recent Labs  01/12/17 0650  HGB 12.4  HCT 37.7    Assessment/Plan: Plan for discharge tomorrow, Breastfeeding and Contraception Nexplanon   LOS: 1 day   Ashley Sparks 01/13/2017, 6:20 AM

## 2017-01-13 NOTE — Progress Notes (Signed)
MOB was referred for history of depression/anxiety. * Referral screened out by Clinical Social Worker because none of the following criteria appear to apply: ~ History of anxiety/depression during this pregnancy, or of post-partum depression. ~ Diagnosis of anxiety and/or depression within last 3 years OR * MOB's symptoms currently being treated with medication and/or therapy.  No concerns not in OB records.  Please contact the Clinical Social Worker if needs arise, by MOB request, or if MOB scores 9 or greater/yes to question 10 on Edinburgh Postpartum Depression Screen.  Yoni Lobos Boyd-Gilyard, MSW, LCSW Clinical Social Work [phone] 

## 2017-01-13 NOTE — Anesthesia Postprocedure Evaluation (Signed)
Anesthesia Post Note  Patient: Lyndal PulleyCierra Levenhagen  Procedure(s) Performed: * No procedures listed *     Patient location during evaluation: Mother Baby Anesthesia Type: Epidural Level of consciousness: awake and alert and oriented Pain management: pain level controlled Vital Signs Assessment: post-procedure vital signs reviewed and stable Respiratory status: spontaneous breathing and nonlabored ventilation Cardiovascular status: stable Postop Assessment: no headache, patient able to bend at knees, no backache, no signs of nausea or vomiting, epidural receding and adequate PO intake Anesthetic complications: no    Last Vitals:  Vitals:   01/13/17 0056 01/13/17 0508  BP: (!) 114/56 (!) 107/46  Pulse: 83 79  Resp: 18 18  Temp: 37 C 37 C  SpO2:      Last Pain:  Vitals:   01/13/17 0508  TempSrc: Oral  PainSc: 3    Pain Goal: Patients Stated Pain Goal: 3 (01/12/17 2000)               Laban EmperorMalinova,Demichael Traum Hristova

## 2017-01-14 ENCOUNTER — Encounter: Payer: Medicaid Other | Admitting: Certified Nurse Midwife

## 2017-01-14 MED ORDER — IBUPROFEN 600 MG PO TABS: 600.0000 mg | ORAL_TABLET | Freq: Four times a day (QID) | ORAL | 0 refills | Status: DC | PRN

## 2017-01-14 NOTE — Discharge Instructions (Signed)

## 2017-01-14 NOTE — Procedures (Signed)
PROCEDURE NOTE: Nexplanon Insertion  Patient given informed consent, signed copy in the chart, time out was performed.  Postpartum day 1.   Patient's right arm was prepped and draped in the usual sterile fashion. The ruler used to measure and mark insertion area. Pt was prepped with alcohol swab and then injected with 1.5 cc of 1% lidocaine with epinephrine used to anesthetize the area.  Pt was prepped with betadine, nexplanon removed from packaging, device confirmed in needle, then inserted full length of needle and withdrawn per manufacturer's instructions. Minimal blood loss. The insertion site covered with a pressure bandage to minimize bruising. There were no complications and the patient tolerated the procedure well.  Device information was given in handout form. Patient is informed the removal date will be in three years and package insert card filled out and given to her.   West PughMary Wallace Gappa, DO PGY-2 01/14/17, [phone]

## 2017-01-14 NOTE — Discharge Summary (Signed)
OB Discharge Summary     Patient Name: Ashley Sparks DOB: 1995-11-15 MRN: [id number]  Date of admission: 01/12/2017 Delivering MD: Levie HeritageSTINSON, JACOB J   Date of discharge: 01/14/2017  Admitting diagnosis: 40 WEEKS CTX Intrauterine pregnancy: 6458w6d     Secondary diagnosis:  Active Problems:   Indication for care in labor or delivery  Additional problems:  Patient Active Problem List   Diagnosis Date Noted  . Indication for care in labor or delivery 01/12/2017  . Anemia complicating pregnancy in third trimester 11/16/2016  . Maternal iron deficiency anemia affecting pregnancy in third trimester, antepartum 10/28/2016  . PAC (premature atrial contraction) 08/24/2016  . PVC (premature ventricular contraction) 08/24/2016  . 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 Low vitamin D level 06/24/2016  . Supervision of normal pregnancy, antepartum 06/22/2016  . 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 yo G1P1001 at 2658w6d was admitted in Latent Labor on 01/12/2017. Patient had an uncomplicated labor course as follows:  Membrane Rupture Time/Date: 6:50 PM ,01/12/2017   Intrapartum Procedures: Episiotomy: None [1]                                         Lacerations:  1st degree [2];Labial [10]  Patient had a delivery of a Viable infant. 01/12/2017  Information for the patient's newborn:  Ashley Sparks, Girl Kimbella [[id number]][[id number]]  Delivery Method: Vaginal, Spontaneous Delivery (Filed from Delivery Summary)    Pateint had an uncomplicated postpartum course.  She is ambulating, tolerating a regular diet, passing flatus, and urinating well. Patient is discharged home in stable condition on 01/14/17.   Physical exam   Vitals:   01/13/17 0508 01/13/17 1112 01/13/17 1725 01/14/17 0551  BP: (!) 107/46 (!) 99/47 116/78 111/69  Pulse: 79 94 82 76  Resp: 18 20 18 18   Temp: 98.6 F (37 C) 98.3 F (36.8 C) 98.2 F (36.8 C) 98.3 F (36.8 C)  TempSrc: Oral Oral Oral Oral  SpO2:      Weight:      Height:       General: alert, cooperative and no distress Lochia: appropriate Uterine Fundus: firm Incision: N/A DVT Evaluation: No evidence of DVT seen on physical exam. Labs: Lab Results  Component Value Date   WBC 21.1 (H) 01/13/2017   HGB 10.8 (L) 01/13/2017   HCT 33.0 (L) 01/13/2017   MCV 84.8 01/13/2017   PLT 140 (L) 01/13/2017   CMP Latest Ref Rng & Units 01/06/2017  Glucose 70 - 140 mg/dl 82  BUN 7.0 - 41.326.0 mg/dL 7.3  Creatinine 0.6 - 1.1 mg/dL 0.6  Sodium 244136 - 010145 mEq/L 136  Potassium 3.5 -  5.1 mEq/L 3.8  Chloride 101 - 111 mmol/L -  CO2 22 - 29 mEq/L 20(L)  Calcium 8.4 - 10.4 mg/dL 9.0  Total Protein 6.4 - 8.3 g/dL 6.2(L)  Total Bilirubin 0.20 - 1.20 mg/dL 0.86  Alkaline Phos 40 - 150 U/L 106  AST 5 - 34 U/L 18  ALT 0 - 55 U/L 12    Discharge instruction: per After Visit Summary and "Baby and Me Booklet".  After visit meds:  Allergies as of 01/14/2017      Reactions   Lactose Intolerance (gi) Diarrhea, Other (See Comments)   Gi upset   Gluten Meal Hives, Diarrhea   And abdominal pain   Macrobid [nitrofurantoin Monohyd Macro] Hives, Rash      Medication List    STOP taking these medications   metroNIDAZOLE 500 MG tablet Commonly known as:  FLAGYL   terconazole 0.8 % vaginal cream Commonly known as:  TERAZOL 3     TAKE these medications   acetaminophen 325 MG tablet Commonly known as:  TYLENOL Take 650 mg by mouth every 6 (six) hours as needed for mild pain or headache.   albuterol 108 (90 Base) MCG/ACT inhaler Commonly known as:  PROVENTIL HFA;VENTOLIN HFA Inhale 1-2 puffs into the lungs every 6 (six) hours as needed for wheezing or shortness of breath.    CITRANATAL BLOOM 90-1 MG Tabs Take 1 tablet by mouth daily.   ibuprofen 600 MG tablet Commonly known as:  ADVIL,MOTRIN Take 1 tablet (600 mg total) by mouth every 6 (six) hours as needed for moderate pain or cramping.   promethazine 25 MG tablet Commonly known as:  PHENERGAN Take 12.5-25 mg by mouth every 6 (six) hours as needed for nausea.            Discharge Care Instructions        Start     Ordered   01/14/17 0000  ibuprofen (ADVIL,MOTRIN) 600 MG tablet  Every 6 hours PRN     01/14/17 1105      Diet: routine diet  Activity: Advance as tolerated. Pelvic rest for 6 weeks.   Outpatient follow up:6 weeks Follow up Appt:Future Appointments Date Time Provider Department Center  01/22/2017 1:00 PM Daisy Blossom, MD [facility] None  01/22/2017 2:00 PM [facility] G22 [facility] None  02/11/2017 1:15 PM Denney, Rachelle A, CNM [facility] None   Follow up Visit:No Follow-up on file.  Postpartum contraception: Nexplanon inserted 01/14/17 prior to discharge  Newborn Data: Live born female  Birth Weight: 7 lb 12.9 oz (3541 g) APGAR: 9, 9  Baby Feeding: Breast Disposition:home with mother   01/14/2017 Ashley Beach, DO PGY-2  CNM attestation I have seen and examined this patient and agree with above documentation in the resident's note.   Ashley Sparks is a 21 y.o. G1P1001 s/p SVD.   Pain is well controlled.  Plan for birth control is s/p Nex insertion.  Method of Feeding: breast  PE:  BP 111/69   Pulse 76   Temp 98.3 F (36.8 C) (Oral)   Resp 18   Ht  (1.549 m)   Wt 83.9 kg (185 lb)   LMP 04/06/2016   SpO2 99%   Breastfeeding? Unknown   BMI 34.96 kg/m  Fundus firm   Recent Labs  01/16/17 0750  HGB 10.9*  HCT 35.0*     Plan: discharge today - postpartum care discussed - f/u clinic in 4 weeks for postpartum visit   Ashley Sparks, CNM 9:55 AM

## 2017-01-14 NOTE — Lactation Note (Signed)
This note was copied from a baby's chart. Lactation Consultation Note  Patient Name: Ashley Lyndal PulleyCierra Giammona ONGEX'BToday's Date: 01/14/2017 Reason for consult: Follow-up assessment Baby at 38 hr of life. Upon entry baby was cueing and mom was agreeable to latch help. She is reporting bilateral sore nipples that coconut oil is not helping. The R areola at the base of the nipple shaft has a thin bright pink crescent line from 8 o'clock to 11 o'clock. The L nipple has a pin sized bright red blister in the center of the nipple. Given comfort gels. Help family get a deeper latch. Discussed baby behavior, feeding frequency, pumping, baby belly size, voids, wt loss, breast changes, and nipple care. Family is aware of lactation services and support group.    Maternal Data    Feeding Feeding Type: Breast Fed Length of feed: 20 min  LATCH Score Latch: Grasps breast easily, tongue down, lips flanged, rhythmical sucking.  Audible Swallowing: Spontaneous and intermittent  Type of Nipple: Everted at rest and after stimulation  Comfort (Breast/Nipple): Filling, red/small blisters or bruises, mild/mod discomfort  Hold (Positioning): Assistance needed to correctly position infant at breast and maintain latch.  LATCH Score: 8  Interventions Interventions: Breast feeding basics reviewed;Assisted with latch;Skin to skin;Breast massage;Hand express;Adjust position;Support pillows;Expressed milk;Coconut oil;Comfort gels  Lactation Tools Discussed/Used WIC Program: Yes   Consult Status Consult Status: Follow-up Date: 01/15/17 Follow-up type: In-patient    Ashley Sparks 01/14/2017, 11:54 AM

## 2017-01-16 ENCOUNTER — Encounter (HOSPITAL_COMMUNITY): Payer: Self-pay

## 2017-01-16 ENCOUNTER — Emergency Department (HOSPITAL_COMMUNITY)
Admission: EM | Admit: 2017-01-16 | Discharge: 2017-01-16 | Disposition: A | Discharge status: Home / Self Care | Payer: Medicaid Other | Attending: Emergency Medicine | Admitting: Emergency Medicine

## 2017-01-16 ENCOUNTER — Emergency Department (HOSPITAL_COMMUNITY): Payer: Medicaid Other

## 2017-01-16 DIAGNOSIS — Z79899 Other long term (current) drug therapy: Secondary | ICD-10-CM | POA: Diagnosis not present

## 2017-01-16 DIAGNOSIS — K5900 Constipation, unspecified: Secondary | ICD-10-CM | POA: Diagnosis not present

## 2017-01-16 DIAGNOSIS — R1030 Lower abdominal pain, unspecified: Secondary | ICD-10-CM

## 2017-01-16 DIAGNOSIS — R339 Retention of urine, unspecified: Secondary | ICD-10-CM | POA: Diagnosis not present

## 2017-01-16 DIAGNOSIS — E039 Hypothyroidism, unspecified: Secondary | ICD-10-CM | POA: Insufficient documentation

## 2017-01-16 LAB — URINALYSIS, ROUTINE W REFLEX MICROSCOPIC

## 2017-01-16 LAB — COMPREHENSIVE METABOLIC PANEL
ALBUMIN: 2.4 g/dL — AB (ref 3.5–5.0)
ALK PHOS: 108 U/L (ref 38–126)
ALT: 31 U/L (ref 14–54)
AST: 32 U/L (ref 15–41)
Anion gap: 8 (ref 5–15)
BILIRUBIN TOTAL: 0.5 mg/dL (ref 0.3–1.2)
BUN: 5 mg/dL — AB (ref 6–20)
CO2: 21 mmol/L — ABNORMAL LOW (ref 22–32)
CREATININE: 0.55 mg/dL (ref 0.44–1.00)
Calcium: 8.8 mg/dL — ABNORMAL LOW (ref 8.9–10.3)
Chloride: 108 mmol/L (ref 101–111)
GFR calc Af Amer: >60 mL/min (ref >60)
GLUCOSE: 99 mg/dL (ref 65–99)
Potassium: 3.2 mmol/L — ABNORMAL LOW (ref 3.5–5.1)
Sodium: 137 mmol/L (ref 135–145)
TOTAL PROTEIN: 5.7 g/dL — AB (ref 6.5–8.1)

## 2017-01-16 LAB — CBC WITH DIFFERENTIAL/PLATELET
Basophils Absolute: 0 10*3/uL (ref 0.0–0.1)
Basophils Relative: 0 %
EOS PCT: 1 %
Eosinophils Absolute: 0.2 10*3/uL (ref 0.0–0.7)
HEMATOCRIT: 35 % — AB (ref 36.0–46.0)
HEMOGLOBIN: 10.9 g/dL — AB (ref 12.0–15.0)
LYMPHS ABS: 1 10*3/uL (ref 0.7–4.0)
LYMPHS PCT: 8 %
MCH: 26.4 pg (ref 26.0–34.0)
MCHC: 31.1 g/dL (ref 30.0–36.0)
MCV: 84.7 fL (ref 78.0–100.0)
Monocytes Absolute: 0.7 10*3/uL (ref 0.1–1.0)
Monocytes Relative: 6 %
NEUTROS ABS: 10.4 10*3/uL — AB (ref 1.7–7.7)
Neutrophils Relative %: 85 %
Platelets: 168 10*3/uL (ref 150–400)
RBC: 4.13 MIL/uL (ref 3.87–5.11)
WBC: 12.2 10*3/uL — AB (ref 4.0–10.5)

## 2017-01-16 LAB — URINALYSIS, MICROSCOPIC (REFLEX): BACTERIA UA: NONE SEEN

## 2017-01-16 LAB — SAMPLE TO BLOOD BANK

## 2017-01-16 MED ORDER — MILK AND MOLASSES ENEMA
1.0000 | Freq: Once | RECTAL | Status: AC
Start: 2017-01-16 — End: 2017-01-16
  Administered 2017-01-16: 250 mL via RECTAL
  Filled 2017-01-16: qty 250

## 2017-01-16 MED ORDER — MORPHINE SULFATE (PF) 4 MG/ML IV SOLN
4.0000 mg | Freq: Once | INTRAVENOUS | Status: AC
  Administered 2017-01-16: 4 mg via INTRAVENOUS
  Filled 2017-01-16: qty 1

## 2017-01-16 MED ORDER — ONDANSETRON HCL 4 MG/2ML IJ SOLN
4.0000 mg | Freq: Once | INTRAMUSCULAR | Status: AC
  Administered 2017-01-16: 4 mg via INTRAVENOUS
  Filled 2017-01-16: qty 2

## 2017-01-16 MED ORDER — IOPAMIDOL (ISOVUE-300) INJECTION 61%
INTRAVENOUS | Status: AC
  Administered 2017-01-16: 100 mL
  Filled 2017-01-16: qty 100

## 2017-01-16 MED ORDER — OXYCODONE-ACETAMINOPHEN 5-325 MG PO TABS: 1.0000 | ORAL_TABLET | Freq: Once | ORAL | Status: DC

## 2017-01-16 MED ORDER — HYDROCODONE-ACETAMINOPHEN 5-325 MG PO TABS: 1.0000 | ORAL_TABLET | Freq: Four times a day (QID) | ORAL | 0 refills | Status: DC | PRN

## 2017-01-16 MED ORDER — SODIUM CHLORIDE 0.9 % IV BOLUS (SEPSIS)
1000.0000 mL | Freq: Once | INTRAVENOUS | Status: AC
  Administered 2017-01-16: 1000 mL via INTRAVENOUS

## 2017-01-16 NOTE — ED Notes (Signed)
Collected urine sample from patient, patient family is at bedside call bell in reach patient is resting

## 2017-01-16 NOTE — ED Notes (Signed)
Patient able to ambulate independently  

## 2017-01-16 NOTE — Discharge Instructions (Signed)
Take miralax one cap every hour for 3 doses tonight to help with constipation. You can also try ducolax suppositories. Take tylenol or motrin for pain. Take norco for severe pain only. Follow up tomorrow with OB/GYN  in MAU for recheck.

## 2017-01-16 NOTE — ED Provider Notes (Signed)
[facility]-EMERGENCY DEPT Provider Note   CSN: [id number] Arrival date & time: 01/16/17  5188     History   Chief Complaint Chief Complaint  Patient presents with  . Abdominal Pain  . Back Pain    HPI Ashley Sparks is a 21 y.o. female.  HPI Ashley Sparks is a 21 y.o. femalewho is 4 days postpartum, vaginal delivery, no complications,with first-degree vaginal laceration, presents to emergency department with complaint of worsening abdominal pain and vaginal bleeding. Patient reports associated nausea and vomiting. Patient has not had a bowel movement since prior delivery, 5 days. He denies any urinary symptoms. She states that the abdominal pain is diffuse, worse around umbilicus. She feels like abdomen is more swollen than normal. She is also reporting increased vaginal bleeding and a few clots this morning. She took Tylenol and Phenergan yesterday for symptoms before going to bed. No medications taken this morning. She was given Zofran by EMS for vomiting.  Past Medical History:  Diagnosis Date  . Constipation   . Depression   . Gluten intolerance   . Hypothyroid   . Keloid   . Lactose intolerance   . PAC (premature atrial contraction) 08/24/2016  . Palpitations 07/01/2016  . PVC (premature ventricular contraction) 08/24/2016  . Seasonal allergies   . Shortness of breath 07/01/2016   typically during allergy season  . UTI (urinary tract infection)     Patient Active Problem List   Diagnosis Date Noted  . Indication for care in labor or delivery 01/12/2017  . Anemia complicating pregnancy in third trimester 11/16/2016  . Maternal iron deficiency anemia affecting pregnancy in third trimester, antepartum 10/28/2016  . PAC (premature atrial contraction) 08/24/2016  . PVC (premature ventricular contraction) 08/24/2016  . 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 Low vitamin D level 06/24/2016  . Supervision of normal pregnancy, antepartum 06/22/2016  . 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 No pertinent surgical history.  OB History    Gravida Para Term Preterm AB Living   0 0 1   SAB TAB Ectopic Multiple Live Births   0 0 0 0 1       Home Medications    Prior to Admission medications   Medication Sig Start Date End Date Taking? Authorizing Provider  acetaminophen (TYLENOL) 325 MG tablet Take 650 mg by mouth every 6 (six) hours as needed for mild pain or headache.    [provider]  albuterol (PROVENTIL HFA;VENTOLIN HFA) 108 (90 Base) MCG/ACT inhaler Inhale 1-2 puffs into the lungs every 6 (six) hours as needed for wheezing or shortness of breath.    [provider]  ibuprofen (ADVIL,MOTRIN) 600 MG tablet Take 1 tablet (600 mg total) by mouth every 6 (six) hours as needed for moderate pain or cramping. 01/14/17   Key, Jearld Lesch, DO  Prenatal-DSS-FeCb-FeGl-FA (CITRANATAL BLOOM) 90-1 MG TABS Take 1 tablet by mouth daily. 09/07/16   Orvilla Cornwall A, CNM  promethazine (PHENERGAN) 25 MG tablet Take 12.5-25 mg by mouth every 6 (six) hours as needed for nausea.  07/31/16   [provider]    Family History Family History  Problem Relation Age of Onset  . Adopted: Yes  . Diabetes Mother   . Seizures Mother   . Hyperthyroidism Maternal Aunt   . Celiac disease Neg Hx   . Cholelithiasis Neg Hx   . Ulcers Neg Hx     Social History Social History  Substance Use Topics  . Smoking status: Never  Smoker  . Smokeless tobacco: Never Used  . Alcohol use No     Allergies   Lactose intolerance (gi); Gluten meal; and Macrobid [nitrofurantoin monohyd macro]   Review of Systems Review of Systems  Constitutional: Negative for chills and fever.  Respiratory: Negative for cough, chest tightness and shortness of breath.   Cardiovascular: Negative for chest pain, palpitations and leg swelling.  Gastrointestinal: Positive for abdominal pain, nausea and vomiting. Negative for diarrhea.  Genitourinary: Positive for  vaginal bleeding and vaginal pain. Negative for dysuria, flank pain, pelvic pain and vaginal discharge.  Musculoskeletal: Negative for arthralgias, myalgias, neck pain and neck stiffness.  Skin: Negative for rash.  Neurological: Negative for dizziness, weakness and headaches.  All other systems reviewed and are negative.    Physical Exam Updated Vital Signs BP 132/82   Pulse 87   Temp 99.6 F (37.6 C) (Oral)   Resp (!) 25   Ht  (1.549 m)   Wt 77.1 kg (170 lb)   LMP 04/06/2016   SpO2 100%   BMI 32.12 kg/m   Physical Exam  Constitutional: She is oriented to person, place, and time. She appears well-developed and well-nourished. No distress.  HENT:  Head: Normocephalic.  Eyes: Conjunctivae are normal.  Neck: Neck supple.  Cardiovascular: Normal rate, regular rhythm and normal heart sounds.   Pulmonary/Chest: Effort normal and breath sounds normal. No respiratory distress. She has no wheezes. She has no rales.  Abdominal: Soft. Bowel sounds are normal. She exhibits no distension. There is tenderness. There is no rebound.  Diffuse tenderness  Genitourinary:  Genitourinary Comments: vuvla normal. Pinkish brownish lochia present.   Musculoskeletal: She exhibits no edema.  Neurological: She is alert and oriented to person, place, and time.  Skin: Skin is warm and dry.  Psychiatric: She has a normal mood and affect. Her behavior is normal.  Nursing note and vitals reviewed.    ED Treatments / Results  Labs (all labs ordered are listed, but only abnormal results are displayed) Labs Reviewed  CBC WITH DIFFERENTIAL/PLATELET - Abnormal; Notable for the following:       Result Value   WBC 12.2 (*)    Hemoglobin 10.9 (*)    HCT 35.0 (*)    Neutro Abs 10.4 (*)    All other components within normal limits  COMPREHENSIVE METABOLIC PANEL - Abnormal; Notable for the following:    Potassium 3.2 (*)    CO2 21 (*)    BUN 5 (*)    Calcium 8.8 (*)    Total Protein 5.7 (*)     Albumin 2.4 (*)    All other components within normal limits  URINALYSIS, ROUTINE W REFLEX MICROSCOPIC - Abnormal; Notable for the following:    Color, Urine RED (*)    APPearance TURBID (*)    Glucose, UA   (*)    Value: TEST NOT REPORTED DUE TO COLOR INTERFERENCE OF URINE PIGMENT   Hgb urine dipstick   (*)    Value: TEST NOT REPORTED DUE TO COLOR INTERFERENCE OF URINE PIGMENT   Bilirubin Urine   (*)    Value: TEST NOT REPORTED DUE TO COLOR INTERFERENCE OF URINE PIGMENT   Ketones, ur   (*)    Value: TEST NOT REPORTED DUE TO COLOR INTERFERENCE OF URINE PIGMENT   Protein, ur   (*)    Value: TEST NOT REPORTED DUE TO COLOR INTERFERENCE OF URINE PIGMENT   Nitrite   (*)    Value: TEST NOT  REPORTED DUE TO COLOR INTERFERENCE OF URINE PIGMENT   Leukocytes, UA   (*)    Value: TEST NOT REPORTED DUE TO COLOR INTERFERENCE OF URINE PIGMENT   All other components within normal limits  URINALYSIS, MICROSCOPIC (REFLEX) - Abnormal; Notable for the following:    Squamous Epithelial / LPF 6-30 (*)    All other components within normal limits  SAMPLE TO BLOOD BANK    EKG  EKG Interpretation None       Radiology Ct Abdomen Pelvis W Contrast  Result Date: 01/16/2017 CLINICAL DATA:  Pt sts all over abdominal pain since she had her baby 01/12/17 pain worsened last night With nausea and vomiting EXAM: CT ABDOMEN AND PELVIS WITH CONTRAST TECHNIQUE: Multidetector CT imaging of the abdomen and pelvis was performed using the standard protocol following bolus administration of intravenous contrast. CONTRAST:  100 cc ISOVUE-300 IOPAMIDOL (ISOVUE-300) INJECTION 61% COMPARISON:  CT abdomen dated 07/28/2013. FINDINGS: Lower chest: Mild dependent atelectasis at each lung base. Hepatobiliary: No focal liver abnormality is seen. No gallstones, gallbladder wall thickening, or biliary dilatation. Pancreas: Unremarkable. No pancreatic ductal dilatation or surrounding inflammatory changes. Spleen: Normal in size without  focal abnormality. Adrenals/Urinary Tract: Mild bilateral hydronephrosis, right greater than left, commensurate to mass effect on the ureters by the recently gravid uterus. Bladder is moderately distended and displaced anteriorly within the upper pelvis, again related to mass effect by the recently gravid uterus. No CT evidence of pyelonephritis identified. No renal or ureteral calculi identified. Other than the moderate distension, bladder is unremarkable. Stomach/Bowel: No dilated large or small bowel loops. No bowel wall thickening or evidence of bowel wall inflammation seen. Large amount of stool is seen throughout the nondistended colon. Appendix is seen and appears normal. Vascular/Lymphatic: No significant vascular findings are present. No enlarged abdominal or pelvic lymph nodes. Reproductive: Enlarged uterus, recently gravid. Adnexal regions are unremarkable. Other: No free fluid or abscess collection identified. No free intraperitoneal air. Musculoskeletal: No acute or significant osseous findings. Midline abdominal wall diastasis. IMPRESSION: 1. Mild bilateral hydronephrosis, right greater than left, almost certainly due to mass effect on the ureters by the recently gravid (enlarged) uterus. No renal or ureteral calculi identified. Similarly, the bladder is moderately distended and displaced anteriorly within the upper pelvis due to the enlarged uterus. 2. Large amount of stool throughout the nondistended colon (constipation? ). No bowel obstruction or evidence of bowel wall inflammation seen. Appendix appears normal. Electronically Signed   By: Bary RichardStan  Maynard M.D.   On: 01/16/2017 09:49    Procedures Procedures (including critical care time)  Medications Ordered in ED Medications  oxyCODONE-acetaminophen (PERCOCET/ROXICET) 5-325 MG per tablet 1 tablet (not administered)  morphine 4 MG/ML injection 4 mg (4 mg Intravenous Given 01/16/17 0850)  sodium chloride 0.9 % bolus 1,000 mL (0 mLs Intravenous  Stopped 01/16/17 1229)  iopamidol (ISOVUE-300) 61 % injection (100 mLs  Contrast Given 01/16/17 0920)  morphine 4 MG/ML injection 4 mg (4 mg Intravenous Given 01/16/17 0959)  morphine 4 MG/ML injection 4 mg (4 mg Intravenous Given 01/16/17 1128)  milk and molasses enema (250 mLs Rectal Given 01/16/17 1323)  ondansetron (ZOFRAN) injection 4 mg (4 mg Intravenous Given 01/16/17 1333)  morphine 4 MG/ML injection 4 mg (4 mg Intravenous Given 01/16/17 1611)     Initial Impression / Assessment and Plan / ED Course  I have reviewed the triage vital signs and the nursing notes.  Pertinent labs & imaging results that were available during my care of the  patient were reviewed by me and considered in my medical decision making (see chart for details).     Pt in ED with abdominal pain, nausea, worsening bleeding, she is 4 days post partum, [facility], had 1st degree vaginal and labial laceration repaired. PT appears uncomfortable, weak, solmnolent. VS normal at this time. Will get labs, fluids. Call OB/GYN.   9:00 AM [facility] based on our assessment. Discussed with Dr. Deretha Emory, will get CT for further evaluation.  Discussed with radiology technician, regarding ordering pregnancy test. I do not think this test is necessary since she just delivered 4 days ago and it could still be positive.   10:45 CT showing Bilateral mild hydronephrosis, could be from the uterus compressing on the ureters. Have also noticed enlarged bladder.Postvoid residual performed, with patient having a proximally 430 mL of urine in the bladder.CT also showed constipation. Will order an enema. Patient remains afebrile, normal vital signs.  2:20 PM Patient received enema, reassessed, very small bowel movements in the bed and States she feels like she still needs to go. I will call and speak again with OB/GYN to get their advice.  2:35 PM Spoke again  with Dr. Debroah Loop, recommended foley catheter and follow up in MAU tomorrow for rcheck. Meanwhile miralax to help with bowel movements.   4:18 PM Foley placed, 450cc of urine out. Urine is light yellow, clear. Pt had severe pain with foley insertion, crying again, will give another dose of morphine. Pt requesting pain medications for home. Explained it will constipate her more, but will provide with 6 tab of norco until she is seen again. Will send home with miralax.   Vitals:   01/16/17 0930 01/16/17 1200 01/16/17 1530 01/16/17 1600  BP: 113/88 134/89 132/81 139/85  Pulse: 94 88 94 100  Resp: 20 (!) Temp:      TempSrc:      SpO2: 100% 98% 100% 99%  Weight:      Height:         Final Clinical Impressions(s) / ED Diagnoses   Final diagnoses:  Urinary retention  Constipation, unspecified constipation type  Lower abdominal pain    New Prescriptions New Prescriptions   HYDROCODONE-ACETAMINOPHEN (NORCO) 5-325 MG TABLET    Take 1 tablet by mouth every 6 (six) hours as needed for moderate pain.     Jaynie Crumble, PA-C 01/16/17 1624    Vanetta Mulders, MD 01/17/17 [phone]

## 2017-01-16 NOTE — ED Triage Notes (Signed)
[facility] for back pain abdominal pain and headache. Pt just gave birth 4 days ago with no complications. Pt describes the pain in abdomin as cramping

## 2017-01-16 NOTE — Progress Notes (Signed)
PA-C Trellis Moment. Kirichenko - waived the pregnancy test, patient is 4 days post-partum.

## 2017-01-17 ENCOUNTER — Inpatient Hospital Stay (HOSPITAL_COMMUNITY)
Admission: AD | Admit: 2017-01-17 | Discharge: 2017-01-17 | Disposition: A | Discharge status: Home / Self Care | Payer: Medicaid Other | Source: Ambulatory Visit | Attending: Obstetrics and Gynecology | Admitting: Obstetrics and Gynecology

## 2017-01-17 ENCOUNTER — Encounter (HOSPITAL_COMMUNITY): Payer: Self-pay

## 2017-01-17 DIAGNOSIS — K5900 Constipation, unspecified: Secondary | ICD-10-CM | POA: Insufficient documentation

## 2017-01-17 DIAGNOSIS — N3000 Acute cystitis without hematuria: Secondary | ICD-10-CM | POA: Diagnosis not present

## 2017-01-17 DIAGNOSIS — R339 Retention of urine, unspecified: Secondary | ICD-10-CM | POA: Diagnosis not present

## 2017-01-17 DIAGNOSIS — O8629 Other urinary tract infection following delivery: Secondary | ICD-10-CM

## 2017-01-17 LAB — URINALYSIS, ROUTINE W REFLEX MICROSCOPIC
BILIRUBIN URINE: NEGATIVE
Glucose, UA: NEGATIVE mg/dL
Hgb urine dipstick: NEGATIVE
KETONES UR: 5 mg/dL — AB
Nitrite: NEGATIVE
PROTEIN: 30 mg/dL — AB
SQUAMOUS EPITHELIAL / LPF: NONE SEEN
Specific Gravity, Urine: 1.024 (ref 1.005–1.030)
pH: 5 (ref 5.0–8.0)

## 2017-01-17 MED ORDER — CEPHALEXIN 500 MG PO CAPS: 500.0000 mg | ORAL_CAPSULE | Freq: Four times a day (QID) | ORAL | 0 refills | Status: DC

## 2017-01-17 MED ORDER — DOCUSATE SODIUM 100 MG PO CAPS
100.0000 mg | ORAL_CAPSULE | Freq: Two times a day (BID) | ORAL | 0 refills | Status: DC
Start: 2017-01-17 — End: 2018-05-06

## 2017-01-17 NOTE — MAU Provider Note (Signed)
History     CSN: [id number]  Arrival date and time: 01/17/17 1107  First Provider Initiated Contact with Patient 01/17/17 1154      Chief Complaint  Patient presents with  . catheter recheck  . Constipation   HPI  Ashley Sparks is a 21 y.o. [id number] female who presents for urinary catheter evaluation & constipation. She is 4 days s/p SVD. [facility]; was diagnosed with constipation & urinary retention. Catheter was placed & pt told to f/u in MAU today per recommendation by ob/gyn.  Reports improvement in abdominal pain but states that she still hasn't had relief of constipation. Had small BM last night in ED after enema. Was told to take 3 doses of miralax last night which she did but did not produce BM. Last normal BM was the day of her delivery 4 days ago. Denies n/v, fever/chills, abdominal pain, or hematuria.   Past Medical History:  Diagnosis Date  . Constipation   . Depression   . Gluten intolerance   . Hypothyroid   . Keloid   . Lactose intolerance   . PAC (premature atrial contraction) 08/24/2016  . Palpitations 07/01/2016  . PVC (premature ventricular contraction) 08/24/2016  . Seasonal allergies   . Shortness of breath 07/01/2016   typically during allergy season  . UTI (urinary tract infection)     History reviewed. No pertinent surgical history.  Family History  Problem Relation Age of Onset  . Adopted: Yes  . Diabetes Mother   . Seizures Mother   . Hyperthyroidism Maternal Aunt   . Celiac disease Neg Hx   . Cholelithiasis Neg Hx   . Ulcers Neg Hx     Social History  Substance Use Topics  . Smoking status: Never Smoker  . Smokeless tobacco: Never Used  . Alcohol use No    Allergies:  Allergies  Allergen Reactions  . Lactose Intolerance (Gi) Diarrhea and Other (See Comments)    Gi upset   . Gluten Meal Hives, Diarrhea and Other (See Comments)    And abdominal pain  . Macrobid [Nitrofurantoin Monohyd Macro] Hives  and Rash    Prescriptions Prior to Admission  Medication Sig Dispense Refill Last Dose  . albuterol (PROVENTIL HFA;VENTOLIN HFA) 108 (90 Base) MCG/ACT inhaler Inhale 1-2 puffs into the lungs every 6 (six) hours as needed for wheezing or shortness of breath.   Past Month at Unknown time  . HYDROcodone-acetaminophen (NORCO) 5-325 MG tablet Take 1 tablet by mouth every 6 (six) hours as needed for moderate pain. 6 tablet 0 01/16/2017 at Unknown time  . ibuprofen (ADVIL,MOTRIN) 600 MG tablet Take 1 tablet (600 mg total) by mouth every 6 (six) hours as needed for moderate pain or cramping. 30 tablet 0 01/17/2017 at Unknown time  . polyethylene glycol (MIRALAX / GLYCOLAX) packet Take 17 g by mouth 3 (three) times daily.   01/16/2017 at Unknown time  . Prenatal-DSS-FeCb-FeGl-FA (CITRANATAL BLOOM) 90-1 MG TABS Take 1 tablet by mouth daily. 30 tablet 12 01/16/2017 at Unknown time  . promethazine (PHENERGAN) 25 MG tablet Take 12.5-25 mg by mouth every 6 (six) hours as needed for nausea.    Past Week at Unknown time    Review of Systems  Constitutional: Negative.   Gastrointestinal: Positive for constipation. Negative for abdominal pain.  Genitourinary: Positive for difficulty urinating (reason for catheter placement). Negative for hematuria.   Physical Exam   Blood pressure 122/79, pulse 67, temperature 98.3 F (36.8 C), temperature  source Oral, resp. rate 18, unknown if currently breastfeeding.  Physical Exam  Nursing note and vitals reviewed. Constitutional: She is oriented to person, place, and time. She appears well-developed and well-nourished. No distress.  HENT:  Head: Normocephalic and atraumatic.  Eyes: Conjunctivae are normal. Right eye exhibits no discharge. Left eye exhibits no discharge. No scleral icterus.  Neck: Normal range of motion.  Cardiovascular: Normal rate, regular rhythm and normal heart sounds.   No murmur heard. Respiratory: Effort normal and breath sounds normal. No  respiratory distress. She has no wheezes.  GI: Soft. Bowel sounds are normal. There is no tenderness.  Genitourinary:  Genitourinary Comments: Leg bag draining pale yellow urine  Neurological: She is alert and oriented to person, place, and time.  Skin: Skin is warm and dry. She is not diaphoretic.  Psychiatric: She has a normal mood and affect. Her behavior is normal. Judgment and thought content normal.    MAU Course  Procedures Results for orders placed or performed during the hospital encounter of 01/17/17 (from the past 48 hour(s))  Urinalysis, Routine w reflex microscopic     Status: Abnormal   Collection Time: 01/17/17 12:05 PM  Result Value Ref Range   Color, Urine YELLOW YELLOW   APPearance CLEAR CLEAR   Specific Gravity, Urine 1.024 1.005 - 1.030   pH 5.0 5.0 - 8.0   Glucose, UA NEGATIVE NEGATIVE mg/dL   Hgb urine dipstick NEGATIVE NEGATIVE   Bilirubin Urine NEGATIVE NEGATIVE   Ketones, ur 5 (A) NEGATIVE mg/dL   Protein, ur 30 (A) NEGATIVE mg/dL   Nitrite NEGATIVE NEGATIVE   Leukocytes, UA MODERATE (A) NEGATIVE   RBC / HPF 0-5 0 - 5 RBC/hpf   WBC, UA 6-30 0 - 5 WBC/hpf   Bacteria, UA RARE (A) NONE SEEN   Squamous Epithelial / LPF NONE SEEN NONE SEEN   Mucus PRESENT   Culture, OB Urine     Status: Abnormal (Preliminary result)   Collection Time: 01/17/17 12:05 PM  Result Value Ref Range   Specimen Description OB CLEAN CATCH    Special Requests NONE    Culture (A)     70,000 COLONIES/mL STAPHYLOCOCCUS SPECIES (COAGULASE NEGATIVE) SUSCEPTIBILITIES TO FOLLOW NO GROUP B STREP (S.AGALACTIAE) ISOLATED Performed at [facility], [address], [place], [place] [address]    Report Status PENDING     MDM VSS, NAD U/a collected from leg bag --- previous specimen in ED contaminated with blood, not recollected & urine Cx not sent; will send urine culture with today's urine Soap suds enema -- pt reports large BM  Discussed patient with Dr. Vergie Living. At bedside for  void trial. 300 ml of sterile water injected to bladder via catheter prior to catheter removal. Patient able to void, 400 ml of output. Passed void trial.   Assessment and Plan  A: 1. Constipation, unspecified constipation type   2. Urinary retention   3. Acute cystitis without hematuria    P: Discharge home Rx keflex Discussed tx of constipation at home Discussed reasons to return to MAU Keep follow up appointment with OB/PCP  Urine culture pending   Judeth Horn 01/17/2017, 11:54 AM

## 2017-01-17 NOTE — MAU Note (Addendum)
[facility] yesterday for [facility]d pain, dx'd with constipation & urinary retention, foley placed.  Vaginal delivery on Sept 4.   Pt instructed to F/U here today.  Pt states she had BM after enema in ED last night, has only passed gas since.  States she has had a lot of urinary output in catheter.

## 2017-01-17 NOTE — Discharge Instructions (Signed)
Constipation, Adult Constipation is when a person has fewer bowel movements in a week than normal, has difficulty having a bowel movement, or has stools that are dry, hard, or larger than normal. Constipation may be caused by an underlying condition. It may become worse with age if a person takes certain medicines and does not take in enough fluids. Follow these instructions at home: Eating and drinking   Eat foods that have a lot of fiber, such as fresh fruits and vegetables, whole grains, and beans.  Limit foods that are high in fat, low in fiber, or overly processed, such as french fries, hamburgers, cookies, candies, and soda.  Drink enough fluid to keep your urine clear or pale yellow. General instructions  Exercise regularly or as told by your health care provider.  Go to the restroom when you have the urge to go. Do not hold it in.  Take over-the-counter and prescription medicines only as told by your health care provider. These include any fiber supplements.  Practice pelvic floor retraining exercises, such as deep breathing while relaxing the lower abdomen and pelvic floor relaxation during bowel movements.  Watch your condition for any changes.  Keep all follow-up visits as told by your health care provider. This is important. Contact a health care provider if:  You have pain that gets worse.  You have a fever.  You do not have a bowel movement after 4 days.  You vomit.  You are not hungry.  You lose weight.  You are bleeding from the anus.  You have thin, pencil-like stools. Get help right away if:  You have a fever and your symptoms suddenly get worse.  You leak stool or have blood in your stool.  Your abdomen is bloated.  You have severe pain in your abdomen.  You feel dizzy or you faint. This information is not intended to replace advice given to you by your health care provider. Make sure you discuss any questions you have with your health care  provider. Document Released: 01/24/2004 Document Revised: 11/15/2015 Document Reviewed: 10/16/2015 Elsevier Interactive Patient Education  2017 Elsevier Inc.  Urinary Tract Infection, Adult A urinary tract infection (UTI) is an infection of any part of the urinary tract, which includes the kidneys, ureters, bladder, and urethra. These organs make, store, and get rid of urine in the body. UTI can be a bladder infection (cystitis) or kidney infection (pyelonephritis). What are the causes? This infection may be caused by fungi, viruses, or bacteria. Bacteria are the most common cause of UTIs. This condition can also be caused by repeated incomplete emptying of the bladder during urination. What increases the risk? This condition is more likely to develop if:  You ignore your need to urinate or hold urine for long periods of time.  You do not empty your bladder completely during urination.  You wipe back to front after urinating or having a bowel movement, if you are female.  You are uncircumcised, if you are female.  You are constipated.  You have a urinary catheter that stays in place (indwelling).  You have a weak defense (immune) system.  You have a medical condition that affects your bowels, kidneys, or bladder.  You have diabetes.  You take antibiotic medicines frequently or for long periods of time, and the antibiotics no longer work well against certain types of infections (antibiotic resistance).  You take medicines that irritate your urinary tract.  You are exposed to chemicals that irritate your urinary tract.  You are female.  What are the signs or symptoms? Symptoms of this condition include:  Fever.  Frequent urination or passing small amounts of urine frequently.  Needing to urinate urgently.  Pain or burning with urination.  Urine that smells bad or unusual.  Cloudy urine.  Pain in the lower abdomen or back.  Trouble urinating.  Blood in the  urine.  Vomiting or being less hungry than normal.  Diarrhea or abdominal pain.  Vaginal discharge, if you are female.  How is this diagnosed? This condition is diagnosed with a medical history and physical exam. You will also need to provide a urine sample to test your urine. Other tests may be done, including:  Blood tests.  Sexually transmitted disease (STD) testing.  If you have had more than one UTI, a cystoscopy or imaging studies may be done to determine the cause of the infections. How is this treated? Treatment for this condition often includes a combination of two or more of the following:  Antibiotic medicine.  Other medicines to treat less common causes of UTI.  Over-the-counter medicines to treat pain.  Drinking enough water to stay hydrated.  Follow these instructions at home:  Take over-the-counter and prescription medicines only as told by your health care provider.  If you were prescribed an antibiotic, take it as told by your health care provider. Do not stop taking the antibiotic even if you start to feel better.  Avoid alcohol, caffeine, tea, and carbonated beverages. They can irritate your bladder.  Drink enough fluid to keep your urine clear or pale yellow.  Keep all follow-up visits as told by your health care provider. This is important.  Make sure to: ? Empty your bladder often and completely. Do not hold urine for long periods of time. ? Empty your bladder before and after sex. ? Wipe from front to back after a bowel movement if you are female. Use each tissue one time when you wipe. Contact a health care provider if:  You have back pain.  You have a fever.  You feel nauseous or vomit.  Your symptoms do not get better after 3 days.  Your symptoms go away and then return. Get help right away if:  You have severe back pain or lower abdominal pain.  You are vomiting and cannot keep down any medicines or water. This information is not  intended to replace advice given to you by your health care provider. Make sure you discuss any questions you have with your health care provider. Document Released: 02/04/2005 Document Revised: 10/09/2015 Document Reviewed: 03/18/2015 Elsevier Interactive Patient Education  2017 [employer]

## 2017-01-19 LAB — CULTURE, OB URINE

## 2017-01-20 ENCOUNTER — Other Ambulatory Visit: Payer: Self-pay | Admitting: Advanced Practice Midwife

## 2017-01-20 DIAGNOSIS — N3001 Acute cystitis with hematuria: Secondary | ICD-10-CM

## 2017-01-20 DIAGNOSIS — N390 Urinary tract infection, site not specified: Secondary | ICD-10-CM | POA: Insufficient documentation

## 2017-01-20 MED ORDER — SULFAMETHOXAZOLE-TRIMETHOPRIM 800-160 MG PO TABS: 1.0000 | ORAL_TABLET | Freq: Two times a day (BID) | ORAL | 0 refills | Status: DC

## 2017-01-20 NOTE — Progress Notes (Signed)
Urine pathogen not sensitive for Keflex Rx Septra sent in  Patient notified   Aviva SignsWilliams, Wyoma Genson L, CNM

## 2017-01-21 ENCOUNTER — Encounter: Payer: Medicaid Other | Admitting: Certified Nurse Midwife

## 2017-01-22 ENCOUNTER — Ambulatory Visit (HOSPITAL_BASED_OUTPATIENT_CLINIC_OR_DEPARTMENT_OTHER): Payer: Medicaid Other | Admitting: Hematology and Oncology

## 2017-01-22 ENCOUNTER — Telehealth: Payer: Self-pay | Admitting: Hematology and Oncology

## 2017-01-22 ENCOUNTER — Ambulatory Visit: Payer: Medicaid Other

## 2017-01-22 VITALS — BP 116/63 | HR 72 | Temp 98.2°F | Resp 18 | Ht 61.0 in | Wt 162.7 lb

## 2017-01-22 DIAGNOSIS — N920 Excessive and frequent menstruation with regular cycle: Secondary | ICD-10-CM | POA: Diagnosis not present

## 2017-01-22 DIAGNOSIS — E538 Deficiency of other specified B group vitamins: Secondary | ICD-10-CM | POA: Diagnosis not present

## 2017-01-22 DIAGNOSIS — D500 Iron deficiency anemia secondary to blood loss (chronic): Secondary | ICD-10-CM

## 2017-01-22 MED ORDER — CYANOCOBALAMIN 500 MCG PO TABS: 1000.0000 ug | ORAL_TABLET | Freq: Every day | ORAL | 0 refills | Status: AC

## 2017-01-22 MED ORDER — FERROUS SULFATE 325 (65 FE) MG PO TBEC: 325.0000 mg | DELAYED_RELEASE_TABLET | Freq: Three times a day (TID) | ORAL | 0 refills | Status: DC

## 2017-01-22 NOTE — Telephone Encounter (Signed)
Scheduled appt per 9/14 los - Gave patient AVS and calender per los.  

## 2017-01-22 NOTE — Progress Notes (Signed)
Per Dr Gweneth Dimitri, pt is not receiving IV feraheme today.

## 2017-01-27 DIAGNOSIS — E538 Deficiency of other specified B group vitamins: Secondary | ICD-10-CM | POA: Insufficient documentation

## 2017-01-27 NOTE — Assessment & Plan Note (Signed)
B12 deficiency diagnosed based on low-normal B12 level concurrent with elevated methylmalonic acid level our visit in the clinic in August 2018. This is consistent with increased demand bolus by pregnancy and iron replacement therapy not adequately replaced by the previous B12 vitamin supplementation.  Plan: --Start oral B12 supplementation 1 mg oral daily --Will recheck the level at the next visit to assess for efficiency of replacement. If it oral replacement is not improving B12 levels, we will evaluate for possible presence of pernicious anemia.

## 2017-01-27 NOTE — Assessment & Plan Note (Signed)
21 year old female, G1P1, presently postpartum with precedent history of iron deficiency anemia, heavy menstrual periods, and poor responsiveness to oral iron supplementation. Iron deficiency was confirmed by labs in our clinic and patient was treated with parenteral iron replacement therapy due to the late stages of her pregnancy. Therapy resulted in significant symptomatic improvement although by significant improvement in the lab work results. At the present time, patient is approaching iron replete, and anemia is gradually resolving.  Plan: --No need for additional parenteral iron at this time --I recommend patient to start ferrous sulfate 1-3 tablets per day as a maintenance

## 2017-01-27 NOTE — Progress Notes (Signed)
[facility] Follow-up Visit:  Assessment: Iron deficiency anemia secondary to blood loss (chronic) 21 year old female, G1P1, presently postpartum with precedent history of iron deficiency anemia, heavy menstrual periods, and poor responsiveness to oral iron supplementation. Iron deficiency was confirmed by labs in our clinic and patient was treated with parenteral iron replacement therapy due to the late stages of her pregnancy. Therapy resulted in significant symptomatic improvement although by significant improvement in the lab work results. At the present time, patient is approaching iron replete, and anemia is gradually resolving.  Plan: --No need for additional parenteral iron at this time --I recommend patient to start ferrous sulfate 1-3 tablets per day as a maintenance   B12 deficiency B12 deficiency diagnosed based on low-normal B12 level concurrent with elevated methylmalonic acid level our visit in the clinic in August 2018. This is consistent with increased demand bolus by pregnancy and iron replacement therapy not adequately replaced by the previous B12 vitamin supplementation.  Plan: --Start oral B12 supplementation 1 mg oral daily --Will recheck the level at the next visit to assess for efficiency of replacement. If it oral replacement is not improving B12 levels, we will evaluate for possible presence of pernicious anemia.  Return to clinic in 1 month with labs as response to oral iron and B12 supplementation  Voice recognition software was used and creation of this note. Despite my best effort at editing the text, some misspelling/errors may have occurred.  Orders Placed This Encounter  Procedures  . CBC & Diff and Retic    Standing Status:   Future    Standing Expiration Date:   01/22/2018  . Comprehensive metabolic panel    Standing Status:   Future    Standing Expiration Date:   01/22/2018  . Ferritin    Standing Status:   Future    Standing  Expiration Date:   01/22/2018  . Iron and TIBC    Standing Status:   Future    Standing Expiration Date:   01/22/2018  . Vitamin B12    Standing Status:   Future    Standing Expiration Date:   01/22/2018   All questions were answered.  . The patient knows to call the clinic with any problems, questions or concerns.  This note was electronically signed.    History of Presenting Illness Ashley Sparks is a 21 y.o. African-American female presenting to the [facility] for monitoring of diagnosis of iron deficiency anemia Which was initially presented to [place] during pregnancy in third trimester. Patient was previously noted to have progressive anemia through the course of the pregnancy despite taking prenatal vitamins containing iron. Supplementation has been changed, but did not result in any improvement in hemoglobin or iron values. Patient is known to have iron deficiency throughout her pre-gravid state, with previous history of pica behavior presenting as ice cravings. Previously treated with oral iron in the past with reportedly a poor response. Prior to the pregnancy, patient reports having reasonably heavy menstrual periods which were likely the primary contributed to the iron deficiency development.  Your total late stages of pregnancy, patient was treated with parenteral iron supplementation and has received 2 doses of Feraheme. Iron administration resulted in significant symptomatic improvement. Patient underwent successful delivery of her child on 01/12/17 and progressed without significant complication subsequently. Patient denies any new complaints at this time.  Oncological/hematological History: --Labs, 06/22/16: Hgb 12.3, MCV 86.0, MCH 28.1; TSH 0.011; Hgb electrophoresis -- normal electrophoresis results without evidence of abnormal hemoglobin  variants. --Labs, 07/28/16: Hgb 11.3, MCV 84.0, MCH 28.7; --Labs, 08/18/16: Hgb 10.6, MCV 84.5, MCH 27.9; --Labs, 10/23/16: Hgb   9.4, MCV  82.0, MCH 26.0, Plt 190; Fe   26, FeSat   5%, TIBC 482, Ferritin     6;  --Labs, 11/16/16: Hgb   9.5, MCV 78.8, MCH 24.2, Plt 174; Fe   21, FeSat   4%, TIBC 580, Ferritin     5; --Labs, 12/14/16: Hgb   9.5, MCV 79.5, MCH 24.6, Plt 173; Fe 148, FeSat 30%, TIBC 489, Ferritin 257, B12 318, MMA 853; --Delivery, 01/12/17 --Labs, 01/16/17: Hgb 10.9, MCV 84.7, MCH 26.4, Plt 168; Fe   83, FeSat 20%, TIBC 420, Ferritin 82  Treatment: --Feraheme 548m:  --Dose #1, 12/07/16    --Dose #2, 12/14/16   Medical History: Past Medical History:  Diagnosis Date  . Constipation   . Depression   . Gluten intolerance   . Hypothyroid   . Keloid   . Lactose intolerance   . PAC (premature atrial contraction) 08/24/2016  . Palpitations 07/01/2016  . PVC (premature ventricular contraction) 08/24/2016  . Seasonal allergies   . Shortness of breath 07/01/2016   typically during allergy season  . UTI (urinary tract infection)     Surgical History: No past surgical history on file.  Family History: Family History  Problem Relation Age of Onset  . Adopted: Yes  . Diabetes Mother   . Seizures Mother   . Hyperthyroidism Maternal Aunt   . Celiac disease Neg Hx   . Cholelithiasis Neg Hx   . Ulcers Neg Hx     Social History: Social History   Social History  . Marital status: Single    Spouse name: N/A  . Number of children: N/A  . Years of education: N/A   Occupational History  . Not on file.   Social History Main Topics  . Smoking status: Never Smoker  . Smokeless tobacco: Never Used  . Alcohol use No  . Drug use: No  . Sexual activity: Not Currently   Other Topics Concern  . Not on file   Social History Narrative   10th grade (online)    Allergies: Allergies  Allergen Reactions  . Lactose Intolerance (Gi) Diarrhea and Other (See Comments)    Gi upset   . Gluten Meal Hives, Diarrhea and Other (See Comments)    And abdominal pain  . Macrobid [Nitrofurantoin Monohyd Macro] Hives  and Rash    Medications:  Current Outpatient Prescriptions  Medication Sig Dispense Refill  . albuterol (PROVENTIL HFA;VENTOLIN HFA) 108 (90 Base) MCG/ACT inhaler Inhale 1-2 puffs into the lungs every 6 (six) hours as needed for wheezing or shortness of breath.    . cephALEXin (KEFLEX) 500 MG capsule Take 1 capsule (500 mg total) by mouth 4 (four) times daily. 28 capsule 0  . cyanocobalamin 500 MCG tablet Take 2 tablets (1,000 mcg total) by mouth daily. 60 tablet 0  . docusate sodium (COLACE) 100 MG capsule Take 1 capsule (100 mg total) by mouth 2 (two) times daily. 60 capsule 0  . ferrous sulfate 325 (65 FE) MG EC tablet Take 1 tablet (325 mg total) by mouth 3 (three) times daily with meals. 90 tablet 0  . HYDROcodone-acetaminophen (NORCO) 5-325 MG tablet Take 1 tablet by mouth every 6 (six) hours as needed for moderate pain. 6 tablet 0  . ibuprofen (ADVIL,MOTRIN) 600 MG tablet Take 1 tablet (600 mg total) by mouth every 6 (six) hours as needed  for moderate pain or cramping. 30 tablet 0  . polyethylene glycol (MIRALAX / GLYCOLAX) packet Take 17 g by mouth 3 (three) times daily.    . promethazine (PHENERGAN) 25 MG tablet Take 12.5-25 mg by mouth every 6 (six) hours as needed for nausea.     Marland Kitchen sulfamethoxazole-trimethoprim (BACTRIM DS,SEPTRA DS) 800-160 MG tablet Take 1 tablet by mouth 2 (two) times daily. 14 tablet 0   No current facility-administered medications for this visit.     Review of Systems: Review of Systems  All other systems reviewed and are negative.    PHYSICAL EXAMINATION Blood pressure 116/63, pulse 72, temperature 98.2 F (36.8 C), temperature source Oral, resp. rate 18, height 5' 1"  (1.549 m), weight 162 lb 11.2 oz (73.8 kg), SpO2 100 %, unknown if currently breastfeeding.  ECOG PERFORMANCE STATUS: 1 - Symptomatic but completely ambulatory  Physical Exam  Constitutional: She is oriented to person, place, and time and well-developed, well-nourished, and in no  distress. No distress.  HENT:  Head: Normocephalic and atraumatic.  Mouth/Throat: Oropharynx is clear and moist. No oropharyngeal exudate.  Mucosal pallor noted.  Eyes: Pupils are equal, round, and reactive to light. EOM are normal. No scleral icterus.  Neck: No thyromegaly present.  Cardiovascular: Normal rate and regular rhythm.   No murmur heard. Pulmonary/Chest: Effort normal and breath sounds normal. She has no wheezes.  Abdominal: She exhibits no mass.  Musculoskeletal:  Trace bilateral lower extremity edema.  Lymphadenopathy:    She has no cervical adenopathy.  Neurological: She is alert and oriented to person, place, and time. She displays normal reflexes. She exhibits normal muscle tone.  Skin: Skin is warm and dry. No rash noted. She is not diaphoretic. No pallor.     LABORATORY DATA: I have personally reviewed the data as listed: Admission on 01/17/2017, Discharged on 01/17/2017  Component Date Value Ref Range Status  . Color, Urine 01/17/2017 YELLOW  YELLOW Final  . APPearance 01/17/2017 CLEAR  CLEAR Final  . Specific Gravity, Urine 01/17/2017 1.024  1.005 - 1.030 Final  . pH 01/17/2017 5.0  5.0 - 8.0 Final  . Glucose, UA 01/17/2017 NEGATIVE  NEGATIVE mg/dL Final  . Hgb urine dipstick 01/17/2017 NEGATIVE  NEGATIVE Final  . Bilirubin Urine 01/17/2017 NEGATIVE  NEGATIVE Final  . Ketones, ur 01/17/2017 5* NEGATIVE mg/dL Final  . Protein, ur 01/17/2017 30* NEGATIVE mg/dL Final  . Nitrite 01/17/2017 NEGATIVE  NEGATIVE Final  . Leukocytes, UA 01/17/2017 MODERATE* NEGATIVE Final  . RBC / HPF 01/17/2017 0-5  0 - 5 RBC/hpf Final  . WBC, UA 01/17/2017 6-30  0 - 5 WBC/hpf Final  . Bacteria, UA 01/17/2017 RARE* NONE SEEN Final  . Squamous Epithelial / LPF 01/17/2017 NONE SEEN  NONE SEEN Final  . Mucus 01/17/2017 PRESENT   Final  . Specimen Description 01/17/2017 OB CLEAN CATCH   Final  . Special Requests 01/17/2017 NONE   Final  . Culture 01/17/2017 *  Final                    Value:70,000 COLONIES/mL STAPHYLOCOCCUS SPECIES (COAGULASE NEGATIVE) NO GROUP B STREP (S.AGALACTIAE) ISOLATED Performed at [facility], [place] [address] [place], [place] [address]   . Report Status 01/17/2017 01/19/2017 FINAL   Final  . Organism ID, Bacteria 01/17/2017 STAPHYLOCOCCUS SPECIES (COAGULASE NEGATIVE)*  Final  Admission on 01/16/2017, Discharged on 01/16/2017  Component Date Value Ref Range Status  . WBC 01/16/2017 12.2* 4.0 - 10.5 K/uL Final  .  RBC 01/16/2017 4.13  3.87 - 5.11 MIL/uL Final  . Hemoglobin 01/16/2017 10.9* 12.0 - 15.0 g/dL Final  . HCT 01/16/2017 35.0* 36.0 - 46.0 % Final  . MCV 01/16/2017 84.7  78.0 - 100.0 fL Final  . MCH 01/16/2017 26.4  26.0 - 34.0 pg Final  . MCHC 01/16/2017 31.1  30.0 - 36.0 g/dL Final  . RDW 01/16/2017 NOT CALCULATED  11.5 - 15.5 % Final  . Platelets 01/16/2017 168  150 - 400 K/uL Final  . Neutrophils Relative % 01/16/2017 85  % Final  . Neutro Abs 01/16/2017 10.4* 1.7 - 7.7 K/uL Final  . Lymphocytes Relative 01/16/2017 8  % Final  . Lymphs Abs 01/16/2017 1.0  0.7 - 4.0 K/uL Final  . Monocytes Relative 01/16/2017 6  % Final  . Monocytes Absolute 01/16/2017 0.7  0.1 - 1.0 K/uL Final  . Eosinophils Relative 01/16/2017 1  % Final  . Eosinophils Absolute 01/16/2017 0.2  0.0 - 0.7 K/uL Final  . Basophils Relative 01/16/2017 0  % Final  . Basophils Absolute 01/16/2017 0.0  0.0 - 0.1 K/uL Final  . RBC Morphology 01/16/2017 TEARDROP CELLS   Final  . Sodium 01/16/2017 137  135 - 145 mmol/L Final  . Potassium 01/16/2017 3.2* 3.5 - 5.1 mmol/L Final  . Chloride 01/16/2017 108  101 - 111 mmol/L Final  . CO2 01/16/2017 21* 22 - 32 mmol/L Final  . Glucose, Bld 01/16/2017 99  65 - 99 mg/dL Final  . BUN 01/16/2017 5* 6 - 20 mg/dL Final  . Creatinine, Ser 01/16/2017 0.55  0.44 - 1.00 mg/dL Final  . Calcium 01/16/2017 8.8* 8.9 - 10.3 mg/dL Final  . Total Protein 01/16/2017 5.7* 6.5 - 8.1 g/dL Final  . Albumin 01/16/2017 2.4* 3.5 - 5.0 g/dL  Final  . AST 01/16/2017 32  15 - 41 U/L Final  . ALT 01/16/2017 31  14 - 54 U/L Final  . Alkaline Phosphatase 01/16/2017 108  38 - 126 U/L Final  . Total Bilirubin 01/16/2017 0.5  0.3 - 1.2 mg/dL Final  . GFR calc non Af Amer 01/16/2017 >60  >60 mL/min Final  . GFR calc Af Amer 01/16/2017 >60  >60 mL/min Final   Comment: (NOTE) The eGFR has been calculated using the CKD EPI equation. This calculation has not been validated in all clinical situations. eGFR's persistently <60 mL/min signify possible Chronic Kidney Disease.   . Anion gap 01/16/2017 8  5 - 15 Final  . Blood Bank Specimen 01/16/2017 SAMPLE AVAILABLE FOR TESTING   Final  . Sample Expiration 01/16/2017 01/17/2017   Final  . Color, Urine 01/16/2017 RED* YELLOW Final   BIOCHEMICALS MAY BE AFFECTED BY COLOR  . APPearance 01/16/2017 TURBID* CLEAR Final  . Specific Gravity, Urine 01/16/2017 TEST NOT REPORTED DUE TO COLOR INTERFERENCE OF URINE PIGMENT  1.005 - 1.030 Final  . pH 01/16/2017 TEST NOT REPORTED DUE TO COLOR INTERFERENCE OF URINE PIGMENT  5.0 - 8.0 Final  . Glucose, UA 01/16/2017 TEST NOT REPORTED DUE TO COLOR INTERFERENCE OF URINE PIGMENT* NEGATIVE mg/dL Final  . Hgb urine dipstick 01/16/2017 TEST NOT REPORTED DUE TO COLOR INTERFERENCE OF URINE PIGMENT* NEGATIVE Final  . Bilirubin Urine 01/16/2017 TEST NOT REPORTED DUE TO COLOR INTERFERENCE OF URINE PIGMENT* NEGATIVE Final  . Ketones, ur 01/16/2017 TEST NOT REPORTED DUE TO COLOR INTERFERENCE OF URINE PIGMENT* NEGATIVE mg/dL Final  . Protein, ur 01/16/2017 TEST NOT REPORTED DUE TO COLOR INTERFERENCE OF URINE PIGMENT* NEGATIVE mg/dL Final  .  Nitrite 01/16/2017 TEST NOT REPORTED DUE TO COLOR INTERFERENCE OF URINE PIGMENT* NEGATIVE Final  . Leukocytes, UA 01/16/2017 TEST NOT REPORTED DUE TO COLOR INTERFERENCE OF URINE PIGMENT* NEGATIVE Final  . RBC / HPF 01/16/2017 TOO NUMEROUS TO COUNT  0 - 5 RBC/hpf Final  . WBC, UA 01/16/2017 TOO NUMEROUS TO COUNT  0 - 5 WBC/hpf Final  .  Bacteria, UA 01/16/2017 NONE SEEN  NONE SEEN Final  . Squamous Epithelial / LPF 01/16/2017 6-30* NONE SEEN Final       Ardath Sax, MD

## 2017-02-11 ENCOUNTER — Encounter: Payer: Self-pay | Admitting: Certified Nurse Midwife

## 2017-02-11 ENCOUNTER — Ambulatory Visit (INDEPENDENT_AMBULATORY_CARE_PROVIDER_SITE_OTHER): Payer: Medicaid Other | Admitting: Certified Nurse Midwife

## 2017-02-11 DIAGNOSIS — Z1389 Encounter for screening for other disorder: Secondary | ICD-10-CM | POA: Diagnosis not present

## 2017-02-11 NOTE — Progress Notes (Signed)
Subjective:     Ashley Sparks is a 21 y.o. female who presents for a postpartum visit. She is 4 weeks postpartum following a spontaneous vaginal delivery. I have fully reviewed the prenatal and intrapartum course. The delivery was at 40.1 gestational weeks. Outcome: spontaneous vaginal delivery. Anesthesia: epidural. Postpartum course has been GOOD. Baby's course has been GOOD. Baby is feeding by breast. Bleeding staining only. Bowel function is normal. Bladder function is Normal Patient is not sexually active. Contraception method is none. Postpartum depression screening: negative=4. She got NEXPLANON at [facility].  States that she has some cramping and nausea from the Nexplanon; tolerable.  Baby has thrush and is on Nystatin.  Is boiling bottles.  Does not put to breast: pumping.  Is still bleeding since delivery.    The following portions of the patient's history were reviewed and updated as appropriate: allergies, current medications, past family history, past medical history, past social history, past surgical history and problem list.  Review of Systems Pertinent items noted in HPI and remainder of comprehensive ROS otherwise negative.   Objective:    BP 97/64   Pulse 66   Ht  (1.549 m)   Wt 156 lb 12.8 oz (71.1 kg)   LMP  (LMP Unknown)   Breastfeeding? Yes   BMI 29.63 kg/m   General:  alert, cooperative and no distress   Breasts:  inspection negative, no nipple discharge or bleeding, no masses or nodularity palpable  Lungs: clear to auscultation bilaterally  Heart:  regular rate and rhythm, S1, S2 normal, no murmur, click, rub or gallop  Abdomen: soft, non-tender; bowel sounds normal; no masses,  no organomegaly  Pelvic Exam: Not performed.        Assessment:     Normal 4 week postpartum exam. Pap smear not done at today's visit.    Contraception management: on Nexplanon Plan:    1. Contraception: Nexplanon 2. Follow up in: 1 year or as needed.

## 2017-02-18 ENCOUNTER — Other Ambulatory Visit: Payer: Medicaid Other

## 2017-02-18 NOTE — Progress Notes (Signed)
HEMATOLOGY CLINIC NOTE  Date of Service: 02/22/2017  Patient Care Team: Macy Mis, MD as PCP - General (Family Medicine)  CHIEF COMPLAINTS/PURPOSE OF CONSULTATION:  Iron Deficiency Anemia   Oncological/hematological History: --Labs, 06/22/16: Hgb 12.3, MCV 86.0, MCH 28.1; TSH 0.011; Hgb electrophoresis -- normal electrophoresis results without evidence of abnormal hemoglobin variants. --Labs, 07/28/16: Hgb 11.3, MCV 84.0, MCH 28.7; --Labs, 08/18/16: Hgb 10.6, MCV 84.5, MCH 27.9; --Labs, 10/23/16: Hgb   9.4, MCV 82.0, MCH 26.0, Plt 190; Fe   26, FeSat   5%, TIBC 482, Ferritin     6;  --Labs, 11/16/16: Hgb   9.5, MCV 78.8, MCH 24.2, Plt 174; Fe   21, FeSat   4%, TIBC 580, Ferritin     5; --Labs, 12/14/16: Hgb   9.5, MCV 79.5, MCH 24.6, Plt 173; Fe 148, FeSat 30%, TIBC 489, Ferritin 257, B12 318, MMA 853; --Delivery, 01/12/17 --Labs, 01/16/17: Hgb 10.9, MCV 84.7, MCH 26.4, Plt 168; Fe   83, FeSat 20%, TIBC 420, Ferritin 82   Treatment: --Feraheme :             --Dose #1, 12/07/16                  --Dose #2, 12/14/16 -Oral Iron Polysaccharide once daily starting 02/22/17   Interval History:   Ashley Sparks is a 21 y.o. female who was previously under the care of Dr. Gweneth Dimitri. She is here for f/u for her iron deficiency anemia. She is 5-6 weeks post partum. She had originally gotten slow Fe with special coating for digestion that caused nausea and vomiting. She got IV iron prescribed by Dr. Gweneth Dimitri and she now feels more energic. She is tolerating well. Her Hg is now at 13.5. She is still breast feeding. In the past she was anemic and had menorrhagia before her pregnancy. She is managing her first child, daughter, herself with family support. Her bowels are moving well and normal. She denies abdominal pain and no vaginal discharge to bleeding.    MEDICAL HISTORY:  Past Medical History:  Diagnosis Date  . Constipation   . Depression   . Gluten intolerance   .  Hypothyroid   . Keloid   . Lactose intolerance   . PAC (premature atrial contraction) 08/24/2016  . Palpitations 07/01/2016  . PVC (premature ventricular contraction) 08/24/2016  . Seasonal allergies   . Shortness of breath 07/01/2016   typically during allergy season  . UTI (urinary tract infection)     SURGICAL HISTORY: History reviewed. No pertinent surgical history.  SOCIAL HISTORY: Social History   Social History  . Marital status: Single    Spouse name: N/A  . Number of children: N/A  . Years of education: N/A   Occupational History  . Not on file.   Social History Main Topics  . Smoking status: Never Smoker  . Smokeless tobacco: Never Used  . Alcohol use No  . Drug use: No  . Sexual activity: Not Currently   Other Topics Concern  . Not on file   Social History Narrative   10th grade (online)    FAMILY HISTORY: Family History  Problem Relation Age of Onset  . Adopted: Yes  . Diabetes Mother   . Seizures Mother   . Hyperthyroidism Maternal Aunt   . Celiac disease Neg Hx   . Cholelithiasis Neg Hx   . Ulcers Neg Hx     ALLERGIES:  is allergic to lactose intolerance (gi); gluten meal;  and macrobid [nitrofurantoin monohyd macro].  MEDICATIONS:  Current Outpatient Prescriptions  Medication Sig Dispense Refill  . albuterol (PROVENTIL HFA;VENTOLIN HFA) 108 (90 Base) MCG/ACT inhaler Inhale 1-2 puffs into the lungs every 6 (six) hours as needed for wheezing or shortness of breath.    . docusate sodium (COLACE) 100 MG capsule Take 1 capsule (100 mg total) by mouth 2 (two) times daily. 60 capsule 0  . ibuprofen (ADVIL,MOTRIN) 600 MG tablet Take 1 tablet (600 mg total) by mouth every 6 (six) hours as needed for moderate pain or cramping. 30 tablet 0  . polyethylene glycol (MIRALAX / GLYCOLAX) packet Take 17 g by mouth 3 (three) times daily.    Marland Kitchen sulfamethoxazole-trimethoprim (BACTRIM DS,SEPTRA DS) 800-160 MG tablet Take 1 tablet by mouth 2 (two) times daily. 14  tablet 0  . ferrous sulfate 325 (65 FE) MG EC tablet Take 1 tablet (325 mg total) by mouth 3 (three) times daily with meals. 90 tablet 0   No current facility-administered medications for this visit.     REVIEW OF SYSTEMS:    10 Point review of Systems was done is negative except as noted above.  PHYSICAL EXAMINATION: ECOG PERFORMANCE STATUS: 0 - Asymptomatic  . Vitals:   02/22/17 1324  BP: 112/66  Pulse: 67  Resp: 18  Temp: 98.6 F (37 C)  SpO2: 100%   Filed Weights   02/22/17 1324  Weight: 154 lb 11.2 oz (70.2 kg)   .Body mass index is 29.23 kg/m.   GENERAL:alert, in no acute distress and comfortable SKIN: no acute rashes, no significant lesions EYES: conjunctiva are pink and non-injected, sclera anicteric OROPHARYNX: MMM, no exudates, no oropharyngeal erythema or ulceration NECK: supple, no JVD LYMPH:  no palpable lymphadenopathy in the cervical, axillary or inguinal regions LUNGS: clear to auscultation b/l with normal respiratory effort HEART: regular rate & rhythm ABDOMEN:  normoactive bowel sounds , non tender, not distended. Extremity: no pedal edema PSYCH: alert & oriented x 3 with fluent speech NEURO: no focal motor/sensory deficits  LABORATORY DATA:  I have reviewed the data as listed  . CBC Latest Ref Rng & Units 02/22/2017 01/16/2017 01/13/2017  WBC 3.9 - 10.3 10e3/uL 7.4 12.2(H) 21.1(H)  Hemoglobin 11.6 - 15.9 g/dL 81.1 10.9(L) 10.8(L)  Hematocrit 34.8 - 46.6 % 41.3 35.0(L) 33.0(L)  Platelets 145 - 400 10e3/uL 172 168 140(L)    . CMP Latest Ref Rng & Units 01/16/2017 01/06/2017 12/14/2016  Glucose 65 - 99 mg/dL 99 82 85  BUN 6 - 20 mg/dL 5(L) 7.3 9.1(Y)  Creatinine 0.44 - 1.00 mg/dL 7.82 0.6 0.6  Sodium 956 - 145 mmol/L 137 136 136  Potassium 3.5 - 5.1 mmol/L 3.2(L) 3.8 4.1  Chloride 101 - 111 mmol/L 108 - -  CO2 22 - 32 mmol/L 21(L) 20(L) 20(L)  Calcium 8.9 - 10.3 mg/dL 2.1(H) 9.0 9.2  Total Protein 6.5 - 8.1 g/dL 0.8(M) 6.2(L) 6.5  Total  Bilirubin 0.3 - 1.2 mg/dL 0.5 5.78 4.69  Alkaline Phos 38 - 126 U/L 108 106 105  AST 15 - 41 U/L 32 18 18  ALT 14 - 54 U/L RADIOGRAPHIC STUDIES: I have personally reviewed the radiological images as listed and agreed with the findings in the report. No results found.  ASSESSMENT & PLAN:  Ashley Sparks is a 21 y.o. African-American female with  #1. Iron deficiency anemia secondary to blood loss (chronic) -Under the care of Dr. Gweneth Dimitri iron deficiency confirmed by  previously lab workup. She was treated with parenteral iron replacement therapy due to the late stages of her pregnancy. Therapy resulted in significant symptomatic improvement. She is no longer on parenteral iron at this time. She is on ferrous sulfate 1-2 tablets per day as a maintenance. Ferritin is at 49 and hgb has normalized at 13.5 with significant symptoms at this time. #2. B12 deficiency -Diagnosed based 12/2016 lab workup showing elevated methylmalonic acid level and considers with her pregnancy. She was started on oral B12 supplement on 01/22/17 under care of Dr. Gweneth Dimitri. B12 levels have improved from 318 to 732 PLAN:  -no clear indication for adidtion IV Iron at this time -She will start on oral iron polysaccharide once daily as a maintenance.  -continue B12 1000 mcg po dailt OTC -RTC with Dr.Kale in 6 months with labs -If tolerating maintenance oral iron well and hgb/ferritin stable on f/u in 6 months, I will discharge her to her PCP    All of the patients questions were answered with apparent satisfaction. The patient knows to call the clinic with any problems, questions or concerns.  I spent 15 minutes counseling the patient face to face. The total time spent in the appointment was 20 minutes and more than 50% was on counseling and direct patient cares.    Wyvonnia Lora MD MS AAHIVMS [facility] Hematology/Oncology Physician [facility]  (Office):       [phone] (Work cell):   [phone] (Fax):           [phone]  02/22/2017 1:39 PM  This document serves as a record of services personally performed by Wyvonnia Lora, MD. It was created on her behalf by Delphina Cahill, a trained medical scribe. The creation of this record is based on the scribe's personal observations and the provider's statements to them. This document has been checked and approved by the attending provider.

## 2017-02-19 ENCOUNTER — Telehealth: Payer: Self-pay | Admitting: Hematology

## 2017-02-19 NOTE — Telephone Encounter (Signed)
Moved 10/15 f/u from [facility] to [facility] due to insurance. Spoke with patient she is aware. Also added missed lab from 10/11 to 10/15 prior to f/u -patient aware.

## 2017-02-22 ENCOUNTER — Telehealth: Payer: Self-pay | Admitting: Hematology

## 2017-02-22 ENCOUNTER — Encounter: Payer: Self-pay | Admitting: Hematology

## 2017-02-22 ENCOUNTER — Ambulatory Visit (HOSPITAL_BASED_OUTPATIENT_CLINIC_OR_DEPARTMENT_OTHER): Payer: Medicaid Other | Admitting: Hematology

## 2017-02-22 ENCOUNTER — Other Ambulatory Visit (HOSPITAL_BASED_OUTPATIENT_CLINIC_OR_DEPARTMENT_OTHER): Payer: Medicaid Other

## 2017-02-22 VITALS — BP 112/66 | HR 67 | Temp 98.6°F | Resp 18 | Ht 61.0 in | Wt 154.7 lb

## 2017-02-22 DIAGNOSIS — D500 Iron deficiency anemia secondary to blood loss (chronic): Secondary | ICD-10-CM

## 2017-02-22 DIAGNOSIS — O99013 Anemia complicating pregnancy, third trimester: Secondary | ICD-10-CM | POA: Diagnosis not present

## 2017-02-22 DIAGNOSIS — D509 Iron deficiency anemia, unspecified: Secondary | ICD-10-CM | POA: Diagnosis not present

## 2017-02-22 DIAGNOSIS — E538 Deficiency of other specified B group vitamins: Secondary | ICD-10-CM

## 2017-02-22 LAB — COMPREHENSIVE METABOLIC PANEL
ALBUMIN: 3.9 g/dL (ref 3.5–5.0)
ALK PHOS: 95 U/L (ref 40–150)
ALT: 12 U/L (ref 0–55)
AST: 16 U/L (ref 5–34)
Anion Gap: 7 mEq/L (ref 3–11)
BILIRUBIN TOTAL: 0.35 mg/dL (ref 0.20–1.20)
BUN: 8.8 mg/dL (ref 7.0–26.0)
CO2: 24 mEq/L (ref 22–29)
Calcium: 9.1 mg/dL (ref 8.4–10.4)
Chloride: 109 mEq/L (ref 98–109)
Creatinine: 0.8 mg/dL (ref 0.6–1.1)
EGFR: >60 mL/min/{1.73_m2} (ref >60)
GLUCOSE: 84 mg/dL (ref 70–140)
Potassium: 4 mEq/L (ref 3.5–5.1)
SODIUM: 141 meq/L (ref 136–145)
TOTAL PROTEIN: 7.1 g/dL (ref 6.4–8.3)

## 2017-02-22 LAB — CBC & DIFF AND RETIC
BASO%: 0.3 % (ref 0.0–2.0)
Basophils Absolute: 0 10*3/uL (ref 0.0–0.1)
EOS ABS: 0.1 10*3/uL (ref 0.0–0.5)
EOS%: 1.8 % (ref 0.0–7.0)
HCT: 41.3 % (ref 34.8–46.6)
HEMOGLOBIN: 13.5 g/dL (ref 11.6–15.9)
IMMATURE RETIC FRACT: 1.6 % (ref 1.60–10.00)
LYMPH%: 39.4 % (ref 14.0–49.7)
MCH: 29 pg (ref 25.1–34.0)
MCHC: 32.7 g/dL (ref 31.5–36.0)
MCV: 88.8 fL (ref 79.5–101.0)
MONO#: 0.5 10*3/uL (ref 0.1–0.9)
MONO%: 6.1 % (ref 0.0–14.0)
NEUT%: 52.4 % (ref 38.4–76.8)
NEUTROS ABS: 3.9 10*3/uL (ref 1.5–6.5)
Platelets: 172 10*3/uL (ref 145–400)
RBC: 4.65 10*6/uL (ref 3.70–5.45)
RDW: 17.3 % — ABNORMAL HIGH (ref 11.2–14.5)
RETIC %: 0.78 % (ref 0.70–2.10)
RETIC CT ABS: 36.27 10*3/uL (ref 33.70–90.70)
WBC: 7.4 10*3/uL (ref 3.9–10.3)
lymph#: 2.9 10*3/uL (ref 0.9–3.3)

## 2017-02-22 LAB — IRON AND TIBC
%SAT: 26 % (ref 21–57)
Iron: 72 ug/dL (ref 41–142)
TIBC: 276 ug/dL (ref 236–444)
UIBC: 204 ug/dL (ref 120–384)

## 2017-02-22 LAB — FERRITIN: Ferritin: 49 ng/ml (ref 9–269)

## 2017-02-22 MED ORDER — POLYSACCHARIDE IRON COMPLEX 150 MG PO CAPS: 150.0000 mg | ORAL_CAPSULE | Freq: Two times a day (BID) | ORAL | 6 refills | Status: DC

## 2017-02-22 NOTE — Patient Instructions (Signed)
Thank you for choosing [facility] to provide your oncology and hematology care.  To afford each patient quality time with our providers, please arrive 30 minutes before your scheduled appointment time.  If you arrive late for your appointment, you may be asked to reschedule.  We strive to give you quality time with our providers, and arriving late affects you and other patients whose appointments are after yours.   If you are a no show for multiple scheduled visits, you may be dismissed from the clinic at the providers discretion.    Again, thank you for choosing [facility], our hope is that these requests will decrease the amount of time that you wait before being seen by our physicians.  ______________________________________________________________________  Should you have questions after your visit to the [facility], please contact our office at [phone] between the hours of 8:30 and 4:30 p.m.    Voicemails left after 4:30p.m will not be returned until the following business day.    For prescription refill requests, please have your pharmacy contact us directly.  Please also try to allow 48 hours for prescription requests.    Please contact the scheduling department for questions regarding scheduling.  For scheduling of procedures such as PET scans, CT scans, MRI, Ultrasound, etc please contact central scheduling at [phone].    Resources For Cancer Patients and Caregivers:   Oncolink.org:  A wonderful resource for patients and healthcare providers for information regarding your disease, ways to tract your treatment, what to expect, etc.     American Cancer Society:  800-227-2345  Can help patients locate various types of support and financial assistance  Cancer Care: 1-800-813-HOPE (4673) Provides financial assistance, online support groups, medication/co-pay assistance.    [place] DSS:  336-641-3447 Where to apply for food  stamps, Medicaid, and utility assistance  Medicare Rights Center: 800-333-4114 Helps people with Medicare understand their rights and benefits, navigate the Medicare system, and secure the quality healthcare they deserve  SCAT: 336-333-6589 [place] [employer]'s shared-ride transportation service for eligible riders who have a disability that prevents them from riding the fixed route bus.    For additional information on assistance programs please contact our social worker:   Grier Hock/Abigail Elmore:  [phone]            

## 2017-02-22 NOTE — Telephone Encounter (Signed)
Scheduled appt per 10/15 los - Gave patient AVS and calender per los. Lab and f/u in 6 months.

## 2017-02-23 LAB — VITAMIN B12: VITAMIN B 12: 732 pg/mL (ref 232–1245)

## 2017-08-14 IMAGING — CR DG HAND COMPLETE 3+V*L*
3 series · 3 of 3 positions shown · non-contrast
Comparison: None.

CLINICAL DATA: Left hand injury. Pain in region of the MCP of third
digit.

EXAM:
LEFT HAND - COMPLETE 3+ VIEW

[hand pa]
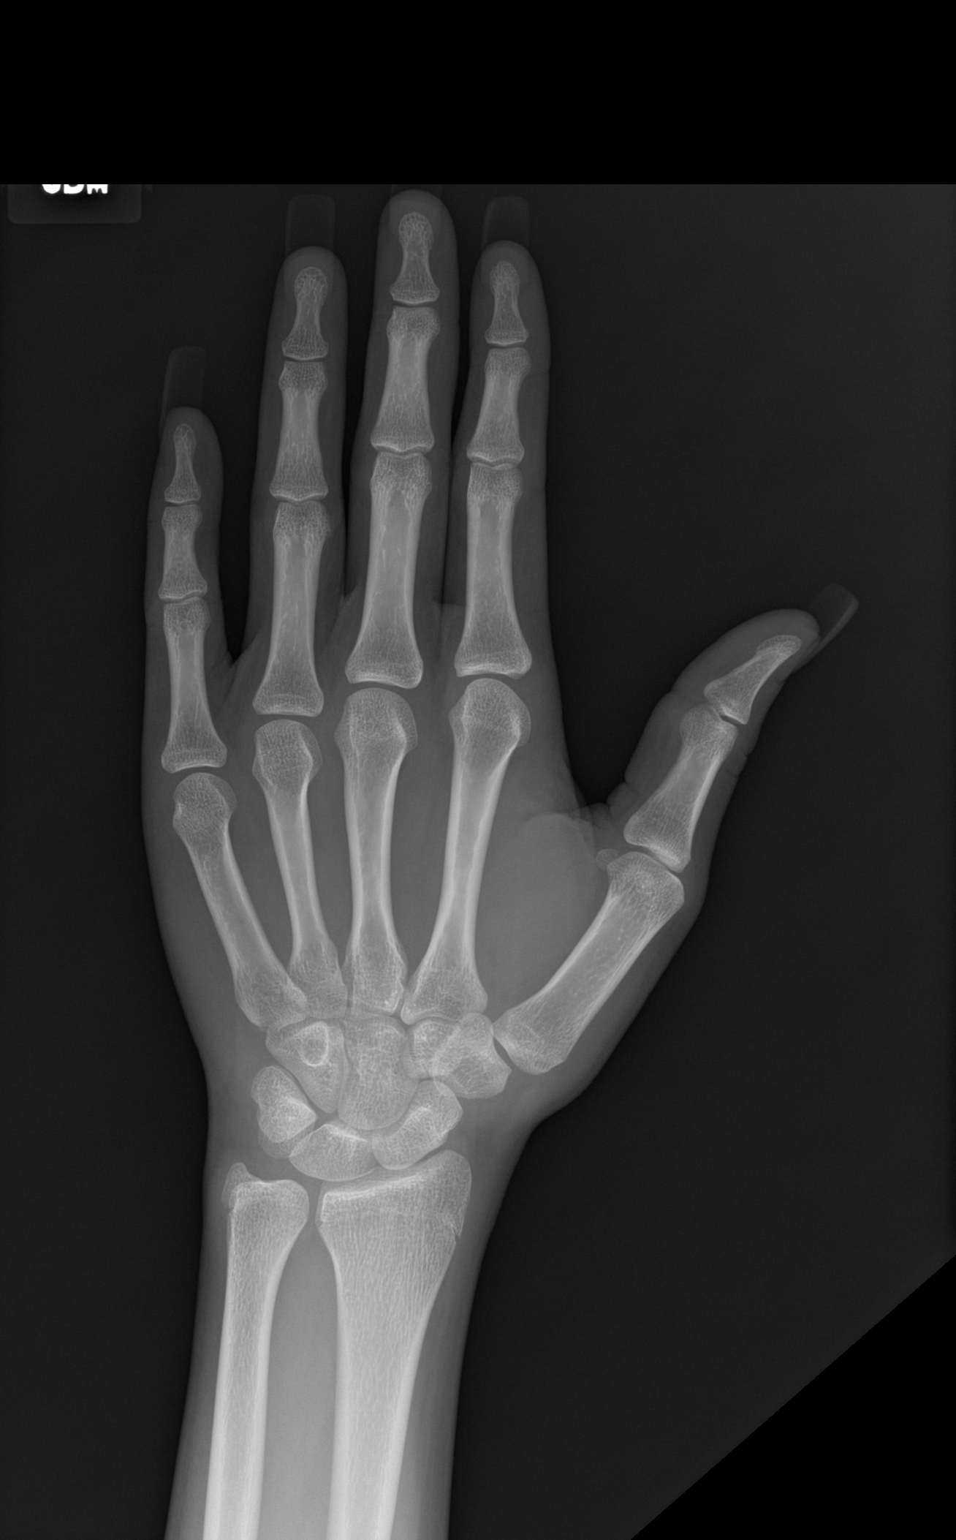

[hand obl]
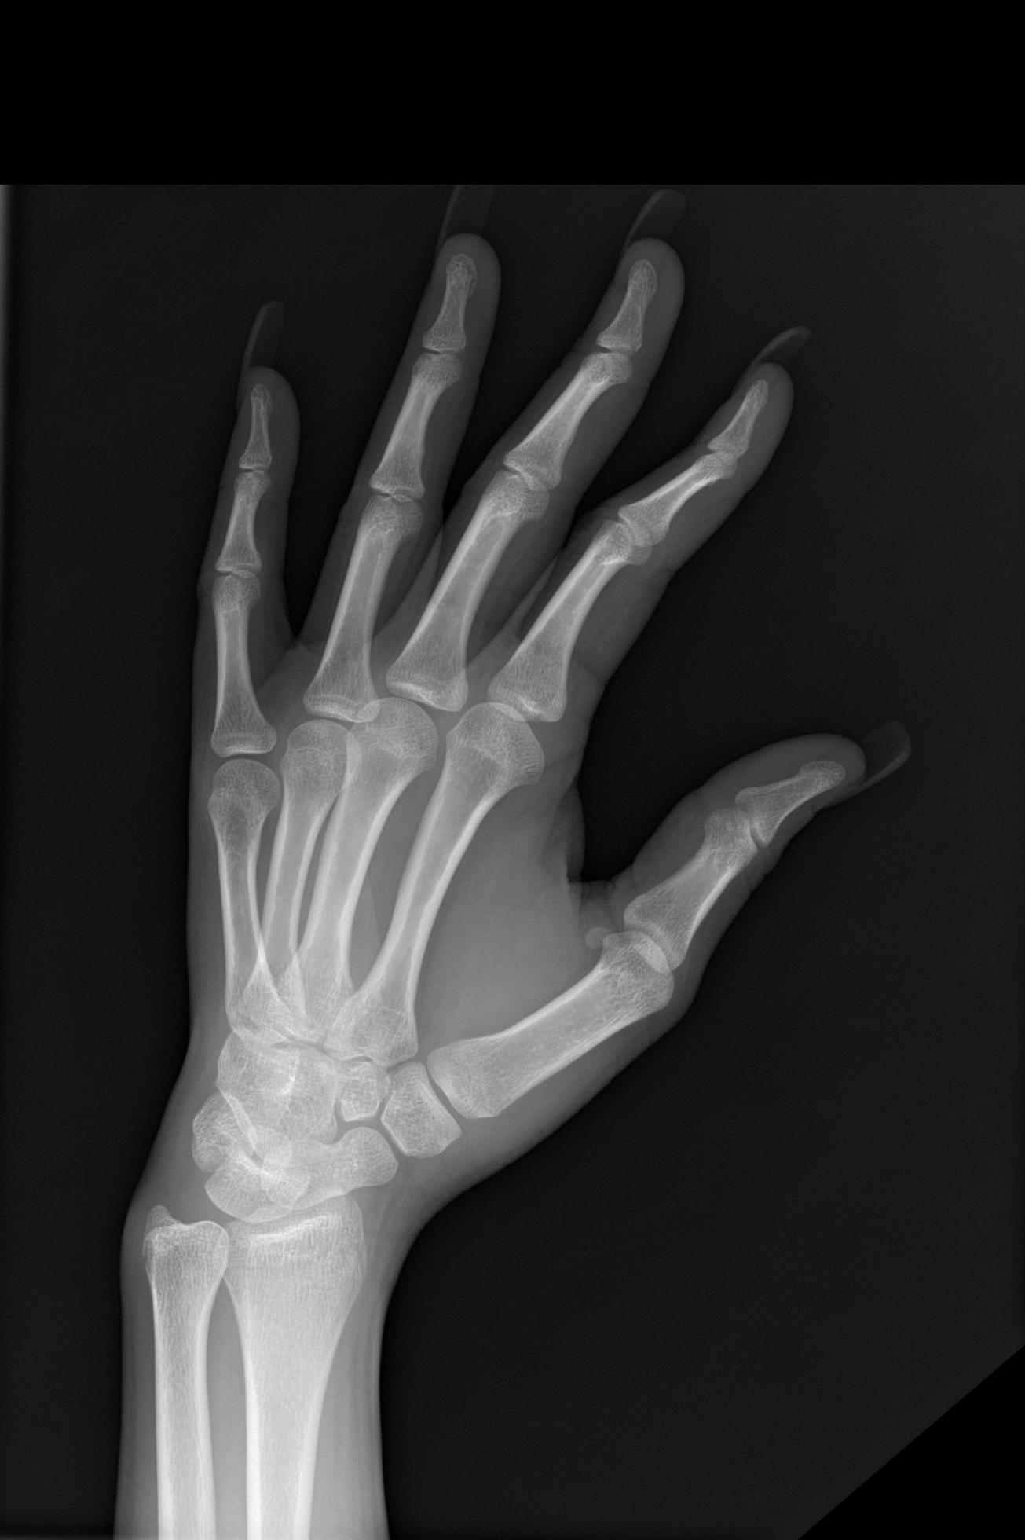

[hand lat]
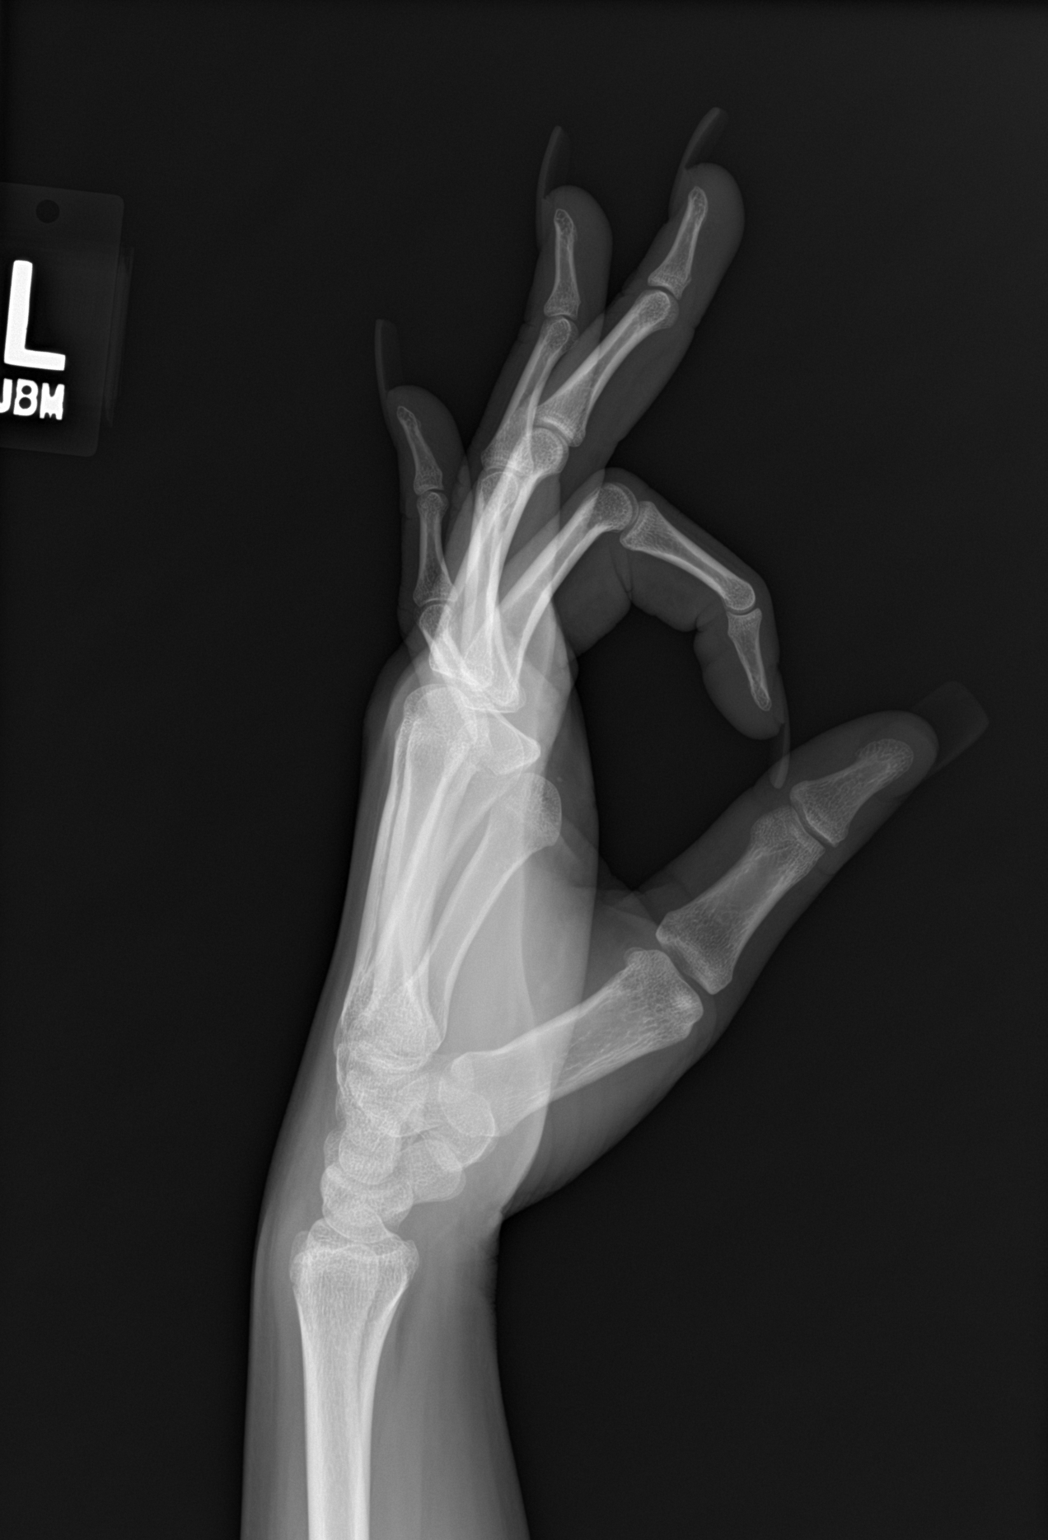

[3 of 3 positions shown; findings below may reference images not displayed]

FINDINGS: There is no evidence of fracture or dislocation. There is no
evidence of arthropathy or other focal bone abnormality. Soft
tissues are unremarkable.
IMPRESSION: Negative.

## 2017-08-17 ENCOUNTER — Telehealth: Payer: Self-pay

## 2017-08-17 ENCOUNTER — Ambulatory Visit: Payer: Medicaid Other | Admitting: Hematology

## 2017-08-17 ENCOUNTER — Other Ambulatory Visit: Payer: Medicaid Other

## 2017-08-17 NOTE — Telephone Encounter (Signed)
Called pt and left VM regarding missed appt today. Pt to call back [phone] and notify office if she would like to f/u.

## 2017-08-23 ENCOUNTER — Other Ambulatory Visit: Payer: Medicaid Other

## 2017-08-23 ENCOUNTER — Ambulatory Visit: Payer: Medicaid Other | Admitting: Hematology

## 2017-08-29 IMAGING — US US OB TRANSVAGINAL
1 series · 13 of 28 positions shown · non-contrast
Comparison: No prior scans from this gestation.

CLINICAL DATA: 20-year-old pregnant female with abdominal pain and
vaginal bleeding. Quantitative beta HCG [DATE].

EDC by LMP: 01/11/2017, projecting to an expected gestational age of
5 weeks 1 day.
EXAM:
OBSTETRIC <14 WK US AND TRANSVAGINAL OB US
TECHNIQUE: Both transabdominal and transvaginal ultrasound examinations were
performed for complete evaluation of the gestation as well as the
maternal uterus, adnexal regions, and pelvic cul-de-sac.
Transvaginal technique was performed to assess early pregnancy.

[Series 1: us ob transvaginal · 0.20mm/px · 13 of 69 slices shown]
[im 3/69]
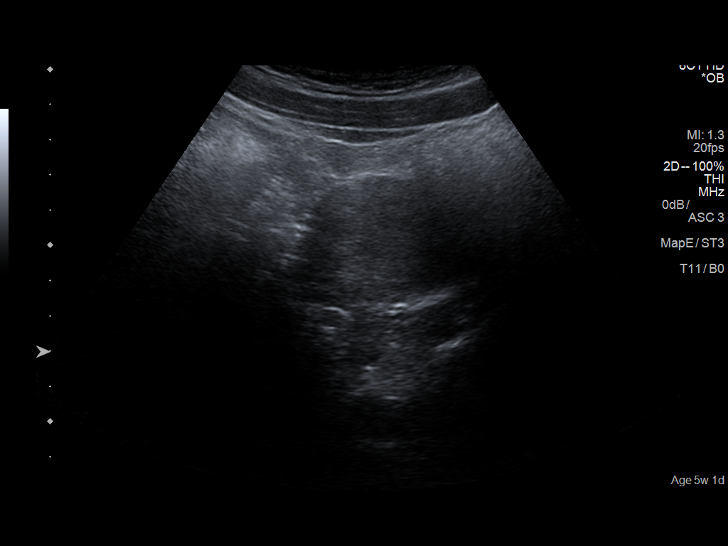
[im 8/69]
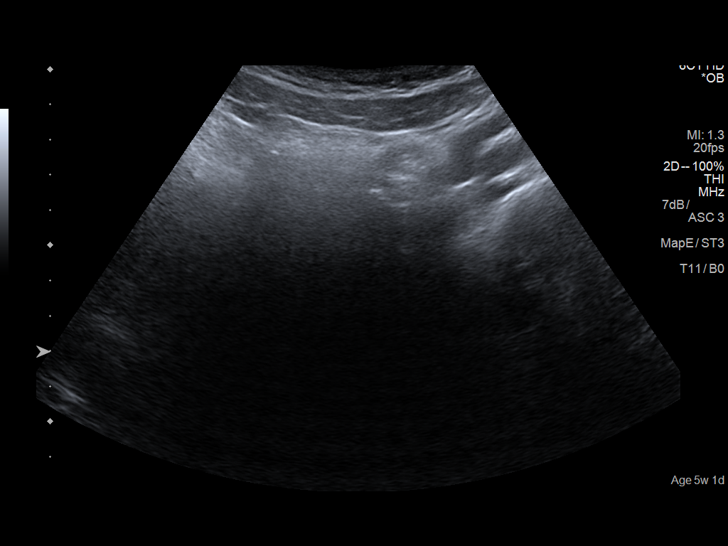
[im 13/69]
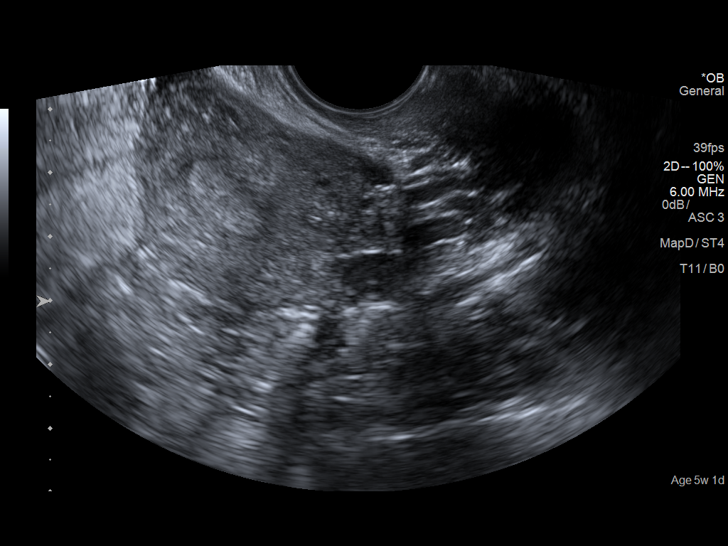
[im 18/69]
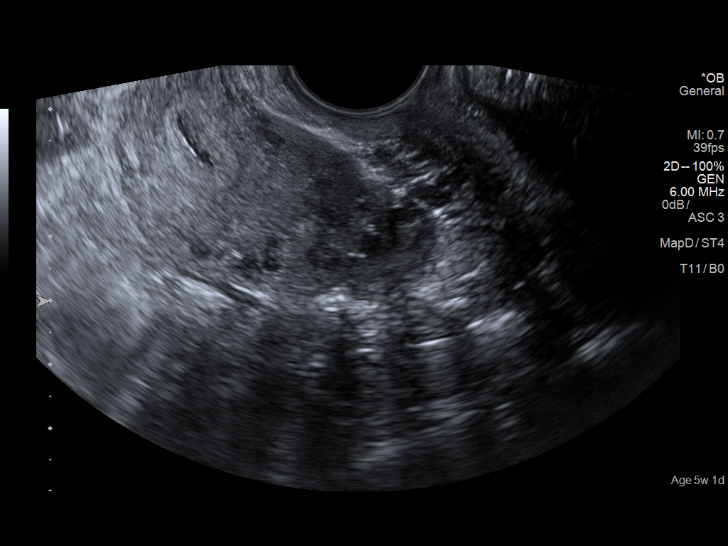
[im 23/69]
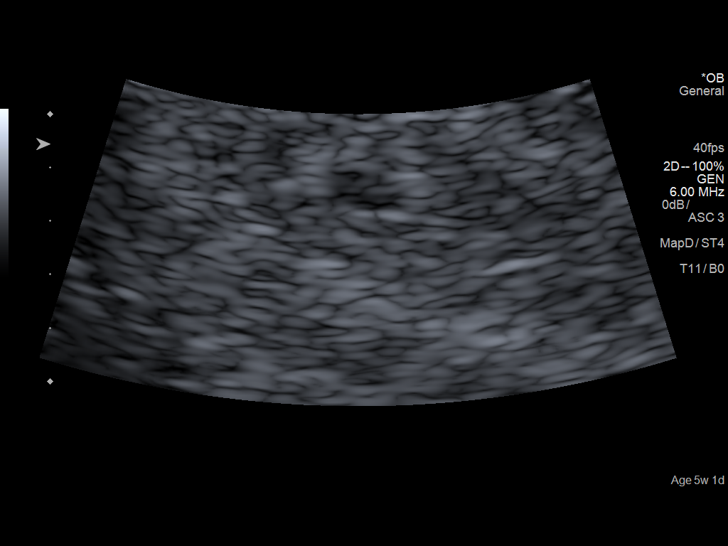
[im 28/69]
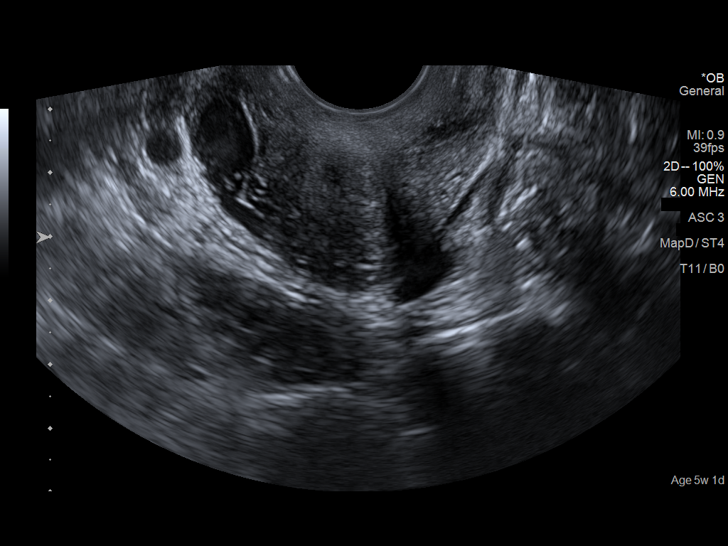
[im 36/69]
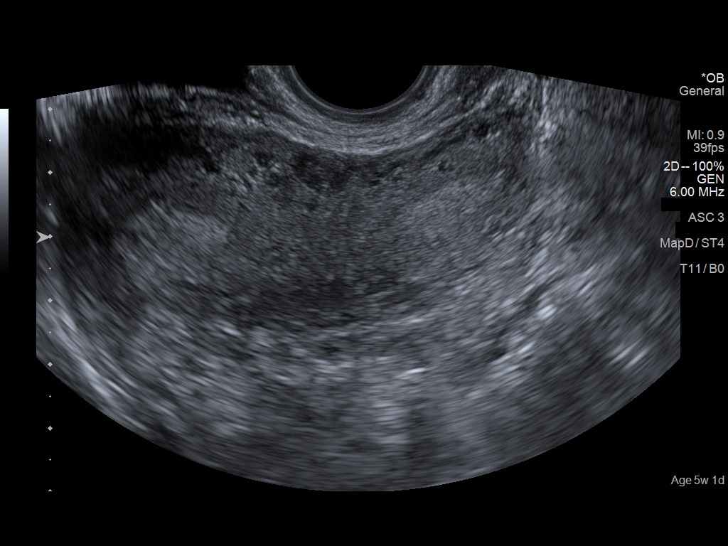
[im 41/69]
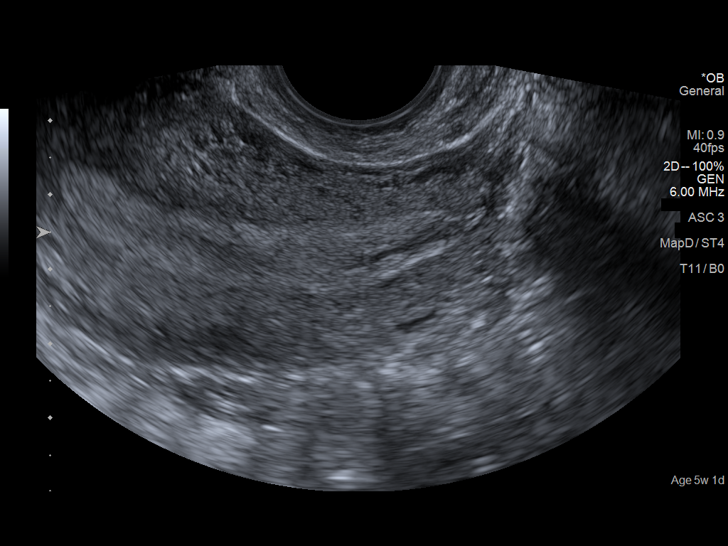
[im 46/69]
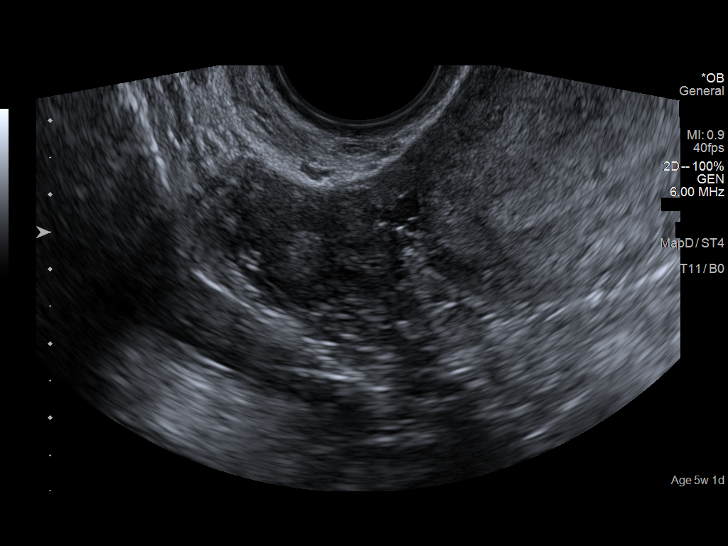
[im 51/69]
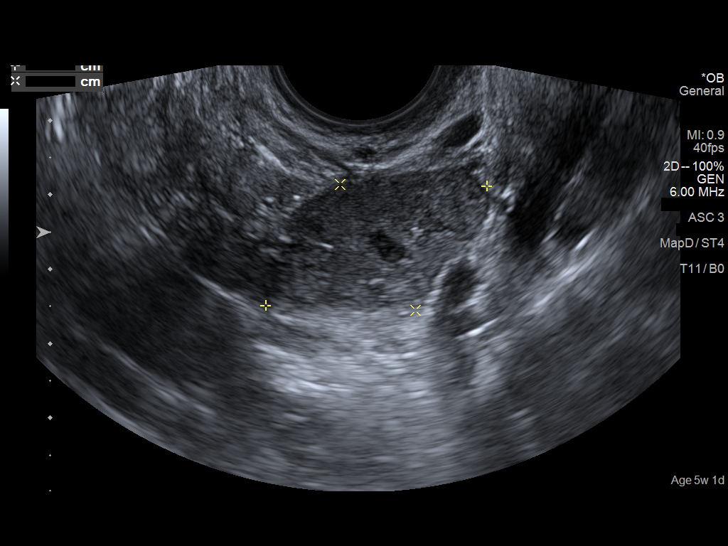
[im 56/69]
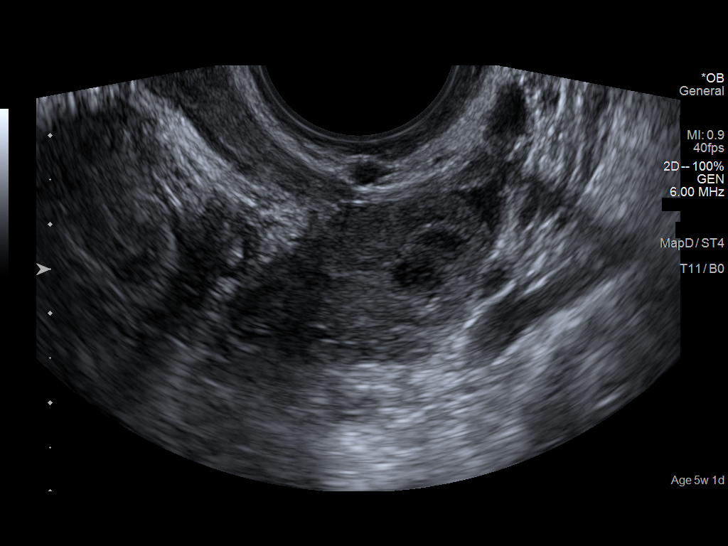
[im 61/69]
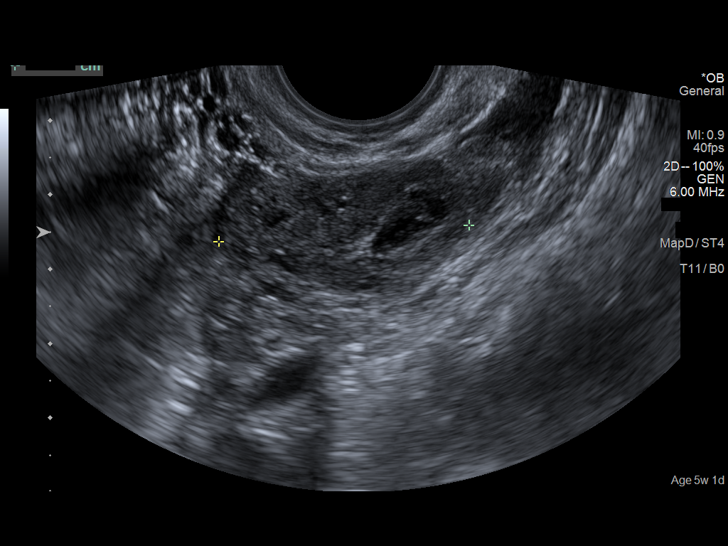
[im 66/69]
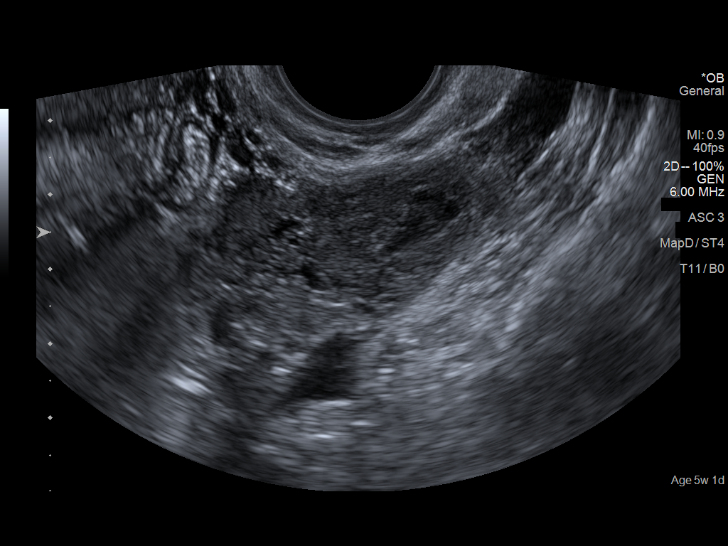

[13 of 28 positions shown; findings below may reference images not displayed]

FINDINGS: The anteverted anteflexed uterus measures 8.3 x 3.6 x 7.0 cm in
size. No uterine fibroids or other myometrial abnormalities are
demonstrated. Bilayer endometrial thickness 10 mm. There is a tiny
sac-like 5 x 2 x 4 mm cystic structure in the upper endometrial
cavity, without appreciable yolk sac, embryo or embryonic cardiac
activity. No additional focal finding in the endometrium.

Right ovary measures 3.3 x 1.6 x 2.3 cm. Left ovary measures 3.4 x
2.0 x 3.4 cm and contains a 1.2 cm corpus luteum. No suspicious
ovarian or adnexal masses are demonstrated. No abnormal free fluid
in the pelvis.
IMPRESSION: Pregnancy is not definitively localized on this scan. Tiny sac-like
5 x 2 x 4 mm cystic structure in the upper endometrial cavity
without yolk sac or embryo visualized, which may represent a tiny
intrauterine gestational sac versus pseudo-gestational sac. No
abnormal ovarian or adnexal masses. Sonographic differential
diagnosis includes intrauterine gestation, spontaneous abortion or
occult ectopic gestation. Recommend close clinical follow-up and
serial serum beta HCG monitoring, with repeat obstetric scan in 7-14
days or earlier as clinically warranted.

## 2017-09-22 ENCOUNTER — Other Ambulatory Visit: Payer: Self-pay

## 2017-09-22 ENCOUNTER — Encounter (HOSPITAL_COMMUNITY): Payer: Self-pay

## 2017-09-22 ENCOUNTER — Emergency Department (HOSPITAL_COMMUNITY)
Admission: EM | Admit: 2017-09-22 | Discharge: 2017-09-23 | Disposition: A | Discharge status: Home / Self Care | Payer: Medicaid Other | Attending: Emergency Medicine | Admitting: Emergency Medicine

## 2017-09-22 DIAGNOSIS — R45851 Suicidal ideations: Secondary | ICD-10-CM | POA: Insufficient documentation

## 2017-09-22 DIAGNOSIS — F32A Depression, unspecified: Secondary | ICD-10-CM

## 2017-09-22 DIAGNOSIS — J302 Other seasonal allergic rhinitis: Secondary | ICD-10-CM | POA: Insufficient documentation

## 2017-09-22 DIAGNOSIS — E039 Hypothyroidism, unspecified: Secondary | ICD-10-CM | POA: Diagnosis not present

## 2017-09-22 DIAGNOSIS — F329 Major depressive disorder, single episode, unspecified: Secondary | ICD-10-CM | POA: Diagnosis present

## 2017-09-22 LAB — I-STAT BETA HCG BLOOD, ED (MC, WL, AP ONLY): I-stat hCG, quantitative: <5 m[IU]/mL (ref <5)

## 2017-09-22 LAB — COMPREHENSIVE METABOLIC PANEL
ALK PHOS: 84 U/L (ref 38–126)
ALT: 15 U/L (ref 14–54)
ANION GAP: 8 (ref 5–15)
AST: 19 U/L (ref 15–41)
Albumin: 4.1 g/dL (ref 3.5–5.0)
BUN: 8 mg/dL (ref 6–20)
CALCIUM: 9.4 mg/dL (ref 8.9–10.3)
CHLORIDE: 107 mmol/L (ref 101–111)
CO2: 25 mmol/L (ref 22–32)
Creatinine, Ser: 0.61 mg/dL (ref 0.44–1.00)
GFR calc non Af Amer: >60 mL/min (ref >60)
Glucose, Bld: 98 mg/dL (ref 65–99)
POTASSIUM: 3.9 mmol/L (ref 3.5–5.1)
SODIUM: 140 mmol/L (ref 135–145)
Total Bilirubin: 0.5 mg/dL (ref 0.3–1.2)
Total Protein: 7.4 g/dL (ref 6.5–8.1)

## 2017-09-22 LAB — CBC
HCT: 43.3 % (ref 36.0–46.0)
HEMOGLOBIN: 14.3 g/dL (ref 12.0–15.0)
MCH: 29.9 pg (ref 26.0–34.0)
MCHC: 33 g/dL (ref 30.0–36.0)
MCV: 90.4 fL (ref 78.0–100.0)
Platelets: 241 10*3/uL (ref 150–400)
RBC: 4.79 MIL/uL (ref 3.87–5.11)
RDW: 12.6 % (ref 11.5–15.5)
WBC: 10 10*3/uL (ref 4.0–10.5)

## 2017-09-22 LAB — RAPID URINE DRUG SCREEN, HOSP PERFORMED
Amphetamines: NOT DETECTED
Barbiturates: NOT DETECTED
Benzodiazepines: NOT DETECTED
COCAINE: NOT DETECTED
Opiates: NOT DETECTED
TETRAHYDROCANNABINOL: NOT DETECTED

## 2017-09-22 LAB — ETHANOL: Alcohol, Ethyl (B): <10 mg/dL (ref <10)

## 2017-09-22 LAB — ACETAMINOPHEN LEVEL

## 2017-09-22 LAB — SALICYLATE LEVEL

## 2017-09-22 NOTE — BH Assessment (Addendum)
Tele Assessment Note   Patient Name: Ashley Sparks MRN: [id number] Referring Physician: Garlon Hatchet, PA-C Location of Patient: [facility] Location of Provider: Behavioral Health TTS Department  Ashley Sparks is an 22 y.o. female who presents to the ED voluntarily due to worsening depression and SI without a plan. Pt states she has been depressed since she gave birth to her child in December. Pt states she has been isolated, low motivation, tired, irritable and not feeling like herself. Pt states things got worse this past weekend after an argument transpired between her mother and her child's father on Mother's Day. Pt states her mother told her that her child's father is only allowed at their home during certain times and when her mother saw him there on Sunday she "started yelling and screaming at him." Pt states her child's father was there to bring medicine for their child due to the child teething. Pt states she and her mother used to be very close and they both share the same birthday, however their relationship has been strained lately. Pt states this causes her to feel depressed and suicidal but states she does not have a plan at present. Pt states she has not eaten in the past 2 days due to depression and states her sleep patterns have been inconsistent due to stress.   Pt was asked if she felt she could keep herself safe and she responded "I don't know." Pt states she does not have a current provider but states about 6 years ago she received OPT therapy due to depression and anxiety.   Donell Sievert, PA states pt does not meet inpt criteria and recommends the pt follow up with OPT resources in order to establish OPT care for ongoing mental health needs. EDP Garlon Hatchet, PA-C has been advised.   Diagnosis: MDD, recurrent episode, w/o psychosis  Past Medical History:  Past Medical History:  Diagnosis Date  . Constipation   . Depression   . Gluten intolerance   . Hypothyroid   .  Keloid   . Lactose intolerance   . PAC (premature atrial contraction) 08/24/2016  . Palpitations 07/01/2016  . PVC (premature ventricular contraction) 08/24/2016  . Seasonal allergies   . Shortness of breath 07/01/2016   typically during allergy season  . UTI (urinary tract infection)     History reviewed. No pertinent surgical history.  Family History:  Family History  Adopted: Yes  Problem Relation Age of Onset  . Diabetes Mother   . Seizures Mother   . Hyperthyroidism Maternal Aunt   . Celiac disease Neg Hx   . Cholelithiasis Neg Hx   . Ulcers Neg Hx     Social History:  reports that she has never smoked. She has never used smokeless tobacco. She reports that she does not drink alcohol or use drugs.  Additional Social History:  Alcohol / Drug Use Pain Medications: See MAR Prescriptions: See MAR Over the Counter: See MAR History of alcohol / drug use?: No history of alcohol / drug abuse  CIWA: CIWA-Ar BP: 122/80 Pulse Rate: 94 COWS:    Allergies:  Allergies  Allergen Reactions  . Lactose Intolerance (Gi) Diarrhea and Other (See Comments)    Gi upset   . Gluten Meal Hives, Diarrhea and Other (See Comments)    And abdominal pain  . Macrobid [Nitrofurantoin Monohyd Macro] Hives and Rash    Home Medications:  (Not in a hospital admission)  OB/GYN Status:  Patient's last menstrual period was 09/08/2017.  General Assessment Data Location of Assessment: [facility] ED TTS Assessment: In system Is this a Tele or Face-to-Face Assessment?: Tele Assessment Is this an Initial Assessment or a Re-assessment for this encounter?: Initial Assessment Marital status: Single Is patient pregnant?: No Pregnancy Status: No Living Arrangements: Parent, Children, Other relatives Can pt return to current living arrangement?: Yes Admission Status: Voluntary Is patient capable of signing voluntary admission?: Yes Referral Source: Self/Family/Friend Insurance type: Medicaid     Crisis  Care Plan Living Arrangements: Parent, Children, Other relatives Name of Psychiatrist: none Name of Therapist: none  Education Status Is patient currently in school?: No Is the patient employed, unemployed or receiving disability?: Employed  Risk to self with the past 6 months Suicidal Ideation: Yes-Currently Present Has patient been a risk to self within the past 6 months prior to admission? : No Suicidal Intent: No Has patient had any suicidal intent within the past 6 months prior to admission? : No Is patient at risk for suicide?: Yes Suicidal Plan?: No Has patient had any suicidal plan within the past 6 months prior to admission? : No Access to Means: No What has been your use of drugs/alcohol within the last 12 months?: denies use  Previous Attempts/Gestures: No Triggers for Past Attempts: None known Intentional Self Injurious Behavior: None Family Suicide History: No Recent stressful life event(s): Conflict (Comment), Other (Comment)(conflict with mother, recently had a child ) Persecutory voices/beliefs?: No Depression: Yes Depression Symptoms: Despondent, Insomnia, Tearfulness, Isolating, Fatigue, Loss of interest in usual pleasures, Feeling worthless/self pity Substance abuse history and/or treatment for substance abuse?: No Suicide prevention information given to non-admitted patients: Yes(via fax )  Risk to Others within the past 6 months Homicidal Ideation: No Does patient have any lifetime risk of violence toward others beyond the six months prior to admission? : No Thoughts of Harm to Others: No Current Homicidal Intent: No Current Homicidal Plan: No Access to Homicidal Means: No History of harm to others?: No Assessment of Violence: None Noted Does patient have access to weapons?: No Criminal Charges Pending?: No Does patient have a court date: No Is patient on probation?: No  Psychosis Hallucinations: None noted Delusions: None noted  Mental Status  Report Appearance/Hygiene: In scrubs, Unremarkable Eye Contact: Good Motor Activity: Freedom of movement Speech: Logical/coherent Level of Consciousness: Alert Mood: Depressed, Sad Affect: Depressed, Sad Anxiety Level: None Thought Processes: Relevant, Coherent Judgement: Partial Orientation: Place, Person, Time, Situation, Appropriate for developmental age Obsessive Compulsive Thoughts/Behaviors: None  Cognitive Functioning Concentration: Normal Memory: Recent Intact, Remote Intact Is patient IDD: No Is patient DD?: No Insight: Fair Impulse Control: Fair Appetite: Fair Have you had any weight changes? : No Change Sleep: Decreased Total Hours of Sleep: 6 Vegetative Symptoms: None  ADLScreening [facility] Assessment Services) Patient's cognitive ability adequate to safely complete daily activities?: Yes Patient able to express need for assistance with ADLs?: Yes Independently performs ADLs?: Yes (appropriate for developmental age)  Prior Inpatient Therapy Prior Inpatient Therapy: No  Prior Outpatient Therapy Prior Outpatient Therapy: Yes Prior Therapy Dates: 2013 Prior Therapy Facilty/Provider(s): [facility] Reason for Treatment: Depression Does patient have an ACCT team?: No Does patient have Intensive In-House Services?  : No Does patient have [facility] services? : No Does patient have P4CC services?: No  ADL Screening (condition at time of admission) Patient's cognitive ability adequate to safely complete daily activities?: Yes Is the patient deaf or have difficulty hearing?: No Does the patient have difficulty seeing, even when wearing glasses/contacts?: No Does the  patient have difficulty concentrating, remembering, or making decisions?: No Patient able to express need for assistance with ADLs?: Yes Does the patient have difficulty dressing or bathing?: No Independently performs ADLs?: Yes (appropriate for developmental age) Does the patient have difficulty walking  or climbing stairs?: No Weakness of Legs: None Weakness of Arms/Hands: None  Home Assistive Devices/Equipment Home Assistive Devices/Equipment: None    Abuse/Neglect Assessment (Assessment to be complete while patient is alone) Abuse/Neglect Assessment Can Be Completed: Yes Physical Abuse: Denies Verbal Abuse: Denies Sexual Abuse: Denies Exploitation of patient/patient's resources: Denies Self-Neglect: Denies     Merchant navy officer (For Healthcare) Does Patient Have a Medical Advance Directive?: No Would patient like information on creating a medical advance directive?: No - Patient declined    Additional Information 1:1 In Past 12 Months?: No CIRT Risk: No Elopement Risk: No Does patient have medical clearance?: Yes     Disposition: Donell Sievert, PA states pt does not meet inpt criteria and recommends the pt follow up with OPT resources in order to establish OPT care for ongoing mental health needs. EDP Garlon Hatchet, PA-C has been advised.   Disposition Initial Assessment Completed for this Encounter: Yes Disposition of Patient: Discharge(per Donell Sievert, PA) Patient refused recommended treatment: No Mode of transportation if patient is discharged?: Car  This service was provided via telemedicine using a 2-way, interactive audio and video technology.  Names of all persons participating in this telemedicine service and their role in this encounter. Name: Ashley Sparks Role: Patient   Name: Princess Bruins Role: TTS          Karolee Ohs 09/23/2017 12:23 AM
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
## 2017-09-22 NOTE — ED Notes (Signed)
Two bags of pt belongings sent home with mother.

## 2017-09-22 NOTE — ED Triage Notes (Signed)
Pt reports that she seen her PCP today and has been feeling depressed, prescribed Prozac but the prescription is not ready, having SI thoughts without a plan, denies HI/AVH, pt tearful.

## 2017-09-23 NOTE — Discharge Instructions (Addendum)
I would go ahead and start your prozac.  I have attached some resources for local clinics to help assist with follow-up. Follow-up with your primary care doctor-- notify them if you have any abnormal reactions or adverse side effects from the medication. Return here for any new/acute changes.

## 2017-09-23 NOTE — Progress Notes (Signed)
Donell Sievert, PA states pt does not meet inpt criteria and recommends the pt follow up with OPT resources in order to establish OPT care for ongoing mental health needs. EDP Garlon Hatchet, PA-C has been advised.   TTS attempted to advise the pt's nurse of the disposition and obtain a fax number in order to send the OPT resources but line continues to ring for several minutes with no answer.   TTS will fax resources to main fax for [facility] provided by "Geraldine Contras" [phone]).  Princess Bruins, MSW, LCSW Therapeutic Triage Specialist  [phone]

## 2018-02-01 ENCOUNTER — Encounter (HOSPITAL_COMMUNITY): Payer: Self-pay

## 2018-02-01 ENCOUNTER — Other Ambulatory Visit: Payer: Self-pay

## 2018-02-01 ENCOUNTER — Emergency Department (HOSPITAL_COMMUNITY)
Admission: EM | Admit: 2018-02-01 | Discharge: 2018-02-01 | Disposition: A | Discharge status: Home / Self Care | Payer: Medicaid Other | Attending: Emergency Medicine | Admitting: Emergency Medicine

## 2018-02-01 ENCOUNTER — Emergency Department (HOSPITAL_COMMUNITY): Payer: Medicaid Other

## 2018-02-01 ENCOUNTER — Encounter: Payer: Self-pay | Admitting: Emergency Medicine

## 2018-02-01 DIAGNOSIS — Z5321 Procedure and treatment not carried out due to patient leaving prior to being seen by health care provider: Secondary | ICD-10-CM | POA: Insufficient documentation

## 2018-02-01 DIAGNOSIS — B9789 Other viral agents as the cause of diseases classified elsewhere: Secondary | ICD-10-CM

## 2018-02-01 DIAGNOSIS — E039 Hypothyroidism, unspecified: Secondary | ICD-10-CM | POA: Insufficient documentation

## 2018-02-01 DIAGNOSIS — J302 Other seasonal allergic rhinitis: Secondary | ICD-10-CM | POA: Insufficient documentation

## 2018-02-01 DIAGNOSIS — J069 Acute upper respiratory infection, unspecified: Secondary | ICD-10-CM | POA: Insufficient documentation

## 2018-02-01 DIAGNOSIS — R51 Headache: Secondary | ICD-10-CM | POA: Insufficient documentation

## 2018-02-01 LAB — I-STAT BETA HCG BLOOD, ED (MC, WL, AP ONLY): I-stat hCG, quantitative: <5 m[IU]/mL (ref <5)

## 2018-02-01 LAB — I-STAT TROPONIN, ED: TROPONIN I, POC: 0 ng/mL (ref 0.00–0.08)

## 2018-02-01 LAB — BASIC METABOLIC PANEL
Anion gap: 8 (ref 5–15)
BUN: 10 mg/dL (ref 6–20)
CHLORIDE: 105 mmol/L (ref 98–111)
CO2: 24 mmol/L (ref 22–32)
Calcium: 9 mg/dL (ref 8.9–10.3)
Creatinine, Ser: 0.65 mg/dL (ref 0.44–1.00)
GFR calc Af Amer: >60 mL/min (ref >60)
GFR calc non Af Amer: >60 mL/min (ref >60)
GLUCOSE: 105 mg/dL — AB (ref 70–99)
Potassium: 3.7 mmol/L (ref 3.5–5.1)
Sodium: 137 mmol/L (ref 135–145)

## 2018-02-01 LAB — CBC
HCT: 41.7 % (ref 36.0–46.0)
Hemoglobin: 13.2 g/dL (ref 12.0–15.0)
MCH: 29.9 pg (ref 26.0–34.0)
MCHC: 31.7 g/dL (ref 30.0–36.0)
MCV: 94.3 fL (ref 78.0–100.0)
Platelets: 185 10*3/uL (ref 150–400)
RBC: 4.42 MIL/uL (ref 3.87–5.11)
RDW: 12.1 % (ref 11.5–15.5)
WBC: 9.5 10*3/uL (ref 4.0–10.5)

## 2018-02-01 MED ORDER — BENZONATATE 100 MG PO CAPS: 100.0000 mg | ORAL_CAPSULE | Freq: Three times a day (TID) | ORAL | 0 refills | Status: DC

## 2018-02-01 MED ORDER — FLUTICASONE PROPIONATE 50 MCG/ACT NA SUSP: 2.0000 | Freq: Every day | NASAL | 0 refills | Status: DC

## 2018-02-01 MED ORDER — IPRATROPIUM-ALBUTEROL 0.5-2.5 (3) MG/3ML IN SOLN
3.0000 mL | Freq: Once | RESPIRATORY_TRACT | Status: AC
  Administered 2018-02-01: 3 mL via RESPIRATORY_TRACT
  Filled 2018-02-01: qty 3

## 2018-02-01 MED ORDER — ALBUTEROL SULFATE HFA 108 (90 BASE) MCG/ACT IN AERS
2.0000 | INHALATION_SPRAY | Freq: Once | RESPIRATORY_TRACT | Status: AC
  Administered 2018-02-01: 2 via RESPIRATORY_TRACT
  Filled 2018-02-01: qty 6.7

## 2018-02-01 NOTE — ED Triage Notes (Signed)
Pt states she has had cough and congestion for several days. She states chest pain is constant and described as tightness. No distress noted, skin warm and dry.

## 2018-02-01 NOTE — ED Provider Notes (Signed)
[facility] Provider Note   CSN: [id number] Arrival date & time: 02/01/18  1004     History   Chief Complaint Chief Complaint  Patient presents with  . Cough    HPI Lyndal PulleyCierra Ng is a 22 y.o. female.  HPI   Pt is a 22 y/o female with who presents to the ED cough a productive cough with clear/yellow sputum, rhinorrhea, congestion, headache, and chest tightness/pain that have been ongoing for the last 5-6 days. States she has a h/o seasonal allergies and has similar sxs every year at this.  States CP is constant, dull, mild and on the right side of her chest. States pain is worse with cough and swallowing. Pain not worse with inspiration. Has taken zyzol and ibuprofen which temporarily improves sxs.  Denies leg pain/swelling, hemoptysis, recent surgery/trauma, recent long travel, personal hx of cancer, or hx of DVT/PE. Pt does not take estrogen, uses nexplanon.   Past Medical History:  Diagnosis Date  . Constipation   . Depression   . Gluten intolerance   . Hypothyroid   . Keloid   . Lactose intolerance   . PAC (premature atrial contraction) 08/24/2016  . Palpitations 07/01/2016  . PVC (premature ventricular contraction) 08/24/2016  . Seasonal allergies   . Shortness of breath 07/01/2016   typically during allergy season  . UTI (urinary tract infection)     Patient Active Problem List   Diagnosis Date Noted  . B12 deficiency 01/27/2017  . UTI (urinary tract infection) 01/20/2017  . Anemia complicating pregnancy in third trimester 11/16/2016  . 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 PAC (premature atrial contraction) 08/24/2016  . PVC (premature ventricular contraction) 08/24/2016  . 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 No pertinent surgical history.   OB History    Gravida  1   Para  1   Term  1   Preterm  0   AB  0   Living    1     SAB  0   TAB  0   Ectopic  0   Multiple  0   Live Births  1            Home Medications    Prior to Admission medications   Medication Sig Start Date End Date Taking? Authorizing Provider  benzonatate (TESSALON) 100 MG capsule Take 1 capsule (100 mg total) by mouth every 8 (eight) hours. 02/01/18   Darlen Gledhill S, PA-C  docusate sodium (COLACE) 100 MG capsule Take 1 capsule (100 mg total) by mouth 2 (two) times daily. Patient not taking: Reported on 09/22/2017 01/17/17   Judeth HornLawrence, Erin, NP  fluticasone [facility](FLONASE) 50 MCG/ACT nasal spray Place 2 sprays into both nostrils daily. 02/01/18   Thaddius Manes S, PA-C  ibuprofen (ADVIL,MOTRIN) 600 MG tablet Take 1 tablet (600 mg total) by mouth every 6 (six) hours as needed for moderate pain or cramping. Patient not taking: Reported on 09/22/2017 01/14/17   Larene BeachKey, Mary K, DO  iron polysaccharides (NIFEREX) 150 MG capsule Take 1 capsule (150 mg total) by mouth 2 (two) times daily. Patient not taking: Reported on 09/22/2017 02/22/17   Johney MaineKale, Gautam Kishore, MD  sulfamethoxazole-trimethoprim (BACTRIM DS,SEPTRA DS) 800-160 MG tablet Take 1 tablet by mouth 2 (two) times daily. Patient not taking: Reported on 09/22/2017 01/20/17   Aviva SignsWilliams, Marie L, CNM    Family History Family History  Adopted: Yes  Problem Relation Age of Onset  . Diabetes Mother   . Seizures Mother   . Hyperthyroidism Maternal Aunt   . Celiac disease Neg Hx   . Cholelithiasis Neg Hx   . Ulcers Neg Hx     Social History Social History   Tobacco Use  . Smoking status: Never Smoker  . Smokeless tobacco: Never Used  Substance Use Topics  . Alcohol use: No  . Drug use: No     Allergies   Lactose intolerance (gi); Gluten meal; and Macrobid [nitrofurantoin monohyd macro]   Review of Systems Review of Systems  Constitutional: Negative for chills and fever.  HENT: Positive for congestion, postnasal drip, rhinorrhea and sore throat. Negative for ear pain.    Eyes: Negative for visual disturbance.  Respiratory: Positive for cough. Negative for shortness of breath.   Cardiovascular: Positive for chest pain. Negative for palpitations and leg swelling.  Gastrointestinal: Negative for abdominal pain, constipation, diarrhea, nausea and vomiting.  Genitourinary: Negative for dysuria and urgency.  Musculoskeletal: Negative for back pain.  Skin: Negative for rash.  Neurological: Positive for headaches. Negative for dizziness, weakness, light-headedness and numbness.   Physical Exam Updated Vital Signs BP 101/80 (BP Location: Right Arm)   Pulse 78   Temp 98.4 F (36.9 C) (Oral)   Resp 16   LMP  (LMP Unknown)   SpO2 100%   Physical Exam  Constitutional: She appears well-developed and well-nourished. No distress.  nontoxic  HENT:  Head: Normocephalic and atraumatic.  Right Ear: External ear normal.  Left Ear: External ear normal.  Mouth/Throat: Oropharynx is clear and moist.  bilat TMs WNL. No pharyngeal erythema, tonsillar swelling or exudates. bilat nasal turbinates swollen  Eyes: Pupils are equal, round, and reactive to light. Conjunctivae and EOM are normal.  Neck: Neck supple.  Cardiovascular: Normal rate, regular rhythm, normal heart sounds and intact distal pulses.  No murmur heard. Pulmonary/Chest: Effort normal and breath sounds normal. No stridor. No respiratory distress. She has no wheezes.  Abdominal: Soft. Bowel sounds are normal. She exhibits no distension. There is no tenderness. There is no guarding.  Musculoskeletal:  No calf TTP, erythema, swelling.  Lymphadenopathy:    She has no cervical adenopathy.  Neurological: She is alert.  Skin: Skin is warm and dry. Capillary refill takes less than 2 seconds.  Psychiatric: She has a normal mood and affect.  Nursing note and vitals reviewed.   ED Treatments / Results  Labs (all labs ordered are listed, but only abnormal results are displayed) Labs Reviewed  BASIC METABOLIC  PANEL - Abnormal; Notable for the following components:      Result Value   Glucose, Bld 105 (*)    All other components within normal limits  CBC  I-STAT TROPONIN, ED  I-STAT BETA HCG BLOOD, ED ([facility], [facility], [facility] ONLY)    EKG None  Radiology Dg Chest 2 View  Result Date: 02/01/2018 CLINICAL DATA:  Chest pain and congestion for several days EXAM: CHEST - 2 VIEW COMPARISON:  02/04/2016 FINDINGS: The heart size and mediastinal contours are within normal limits. Both lungs are clear. The visualized skeletal structures are unremarkable. IMPRESSION: No active cardiopulmonary disease. Electronically Signed   By: Alcide Clever M.D.   On: 02/01/2018 11:00    Procedures Procedures (including critical care time)  Medications Ordered in ED Medications  ipratropium-albuterol (DUONEB) 0.5-2.5 (3) MG/3ML nebulizer solution 3 mL (3 mLs Nebulization Given 02/01/18 1420)  albuterol (PROVENTIL HFA;VENTOLIN HFA) 108 (90 Base) MCG/ACT inhaler 2  puff (2 puffs Inhalation Given 02/01/18 1421)     Initial Impression / Assessment and Plan / ED Course  I have reviewed the triage vital signs and the nursing notes.  Pertinent labs & imaging results that were available during my care of the patient were reviewed by me and considered in my medical decision making (see chart for details).  Final Clinical Impressions(s) / ED Diagnoses   Final diagnoses:  Viral URI with cough   Pt CXR negative for acute infiltrate. Patients symptoms are consistent with URI, likely viral etiology. Pt PERC negative and doubt PE or other emergent cardiac/pulmonary etiology at this time. Her sxs resolvedafter dup neb and albuterol inhaler. States she has used inhalers in the past with these sxs and had much relief. Discussed that antibiotics are not indicated for viral infections. Pt will be discharged with symptomatic treatment.  Verbalizes understanding and is agreeable with plan. Pt is hemodynamically stable & in NAD prior to dc.   ED  Discharge Orders         Ordered    benzonatate (TESSALON) 100 MG capsule  Every 8 hours     02/01/18 1332    fluticasone (FLONASE) 50 MCG/ACT nasal spray  Daily     02/01/18 1332           Casie Sturgeon S, PA-C 02/01/18 1442    Melene Plan, DO 02/01/18 2220

## 2018-02-01 NOTE — ED Notes (Signed)
[place] locate [place].

## 2018-02-01 NOTE — Discharge Instructions (Signed)

## 2018-02-01 NOTE — ED Notes (Signed)
Pt called 3 times in lobby for room and no answer. Left post triage.

## 2018-02-01 NOTE — ED Triage Notes (Signed)
Pt reports sinus headache, nasal congestion and itchy throat for the past week, states she has taken otc allergy meds, tylenol and ibuprofen without resolution of symptoms, denies fever or chills.

## 2018-05-06 ENCOUNTER — Emergency Department (HOSPITAL_COMMUNITY): Payer: Self-pay

## 2018-05-06 ENCOUNTER — Encounter (HOSPITAL_COMMUNITY): Payer: Self-pay

## 2018-05-06 ENCOUNTER — Emergency Department (HOSPITAL_COMMUNITY)
Admission: EM | Admit: 2018-05-06 | Discharge: 2018-05-06 | Disposition: A | Discharge status: Home / Self Care | Payer: Self-pay | Attending: Emergency Medicine | Admitting: Emergency Medicine

## 2018-05-06 DIAGNOSIS — E039 Hypothyroidism, unspecified: Secondary | ICD-10-CM | POA: Insufficient documentation

## 2018-05-06 DIAGNOSIS — J069 Acute upper respiratory infection, unspecified: Secondary | ICD-10-CM | POA: Insufficient documentation

## 2018-05-06 DIAGNOSIS — Z79899 Other long term (current) drug therapy: Secondary | ICD-10-CM | POA: Insufficient documentation

## 2018-05-06 LAB — GROUP A STREP BY PCR: GROUP A STREP BY PCR: NOT DETECTED

## 2018-05-06 MED ORDER — BENZONATATE 100 MG PO CAPS: 100.0000 mg | ORAL_CAPSULE | Freq: Three times a day (TID) | ORAL | 0 refills | Status: DC

## 2018-05-06 MED ORDER — CETIRIZINE-PSEUDOEPHEDRINE ER 5-120 MG PO TB12: 1.0000 | ORAL_TABLET | Freq: Every day | ORAL | 0 refills | Status: DC

## 2018-05-06 NOTE — ED Provider Notes (Signed)
[facility] EMERGENCY DEPARTMENT [facility]   CSN: [id number] Arrival date & time: 05/06/18  1057     History   Chief Complaint Chief Complaint  Patient presents with  . Illness    HPI Ashley Sparks is a 22 y.o. female.  22 year old female presents with complaint of cough, congestion, sore throat.  Patient states that she has had symptoms for the past week and a half, developed chills and body aches yesterday.  Patient took her daughter to the pediatrician yesterday where she tested negative for flu but had mono on chest x-ray.  Patient has been using an inhaler, denies history of asthma, reports that she uses this inhaler for her allergies.  Orts having difficulty breathing described as due to sinus congestion.  She does not take anything else for her symptoms at this time no other complaints or concerns.     Past Medical History:  Diagnosis Date  . Constipation   . Depression   . Gluten intolerance   . Hypothyroid   . Keloid   . Lactose intolerance   . PAC (premature atrial contraction) 08/24/2016  . Palpitations 07/01/2016  . PVC (premature ventricular contraction) 08/24/2016  . Seasonal allergies   . Shortness of breath 07/01/2016   typically during allergy season  . UTI (urinary tract infection)     Patient Active Problem List   Diagnosis Date Noted  . B12 deficiency 01/27/2017  . UTI (urinary tract infection) 01/20/2017  . Anemia complicating pregnancy in third trimester 11/16/2016  . 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 PAC (premature atrial contraction) 08/24/2016  . PVC (premature ventricular contraction) 08/24/2016  . 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 No pertinent surgical history.   OB History    Gravida  1   Para  1   Term  1   Preterm  0   AB  0   Living  1     SAB  0   TAB  0   Ectopic  0   Multiple  0   Live  Births  1            Home Medications    Prior to Admission medications   Medication Sig Start Date End Date Taking? Authorizing Provider  benzonatate (TESSALON) 100 MG capsule Take 1 capsule (100 mg total) by mouth every 8 (eight) hours. 05/06/18   Jeannie FendMurphy, Laura A, PA-C  cetirizine-pseudoephedrine (ZYRTEC-D) 5-120 MG tablet Take 1 tablet by mouth daily. 05/06/18   Jeannie FendMurphy, Laura A, PA-C  fluticasone (FLONASE) 50 MCG/ACT nasal spray Place 2 sprays into both nostrils daily. 02/01/18   Couture, Cortni S, PA-C    Family History Family History  Adopted: Yes  Problem Relation Age of Onset  . Diabetes Mother   . Seizures Mother   . Hyperthyroidism Maternal Aunt   . Celiac disease Neg Hx   . Cholelithiasis Neg Hx   . Ulcers Neg Hx     Social History Social History   Tobacco Use  . Smoking status: Never Smoker  . Smokeless tobacco: Never Used  Substance Use Topics  . Alcohol use: No  . Drug use: No     Allergies   Lactose intolerance (gi); Gluten meal; and Macrobid [nitrofurantoin monohyd macro]   Review of Systems Review of Systems  Constitutional: Positive for chills. Negative for fever.  HENT: Positive for congestion, rhinorrhea and sore throat. Negative for sneezing.  Respiratory: Positive for cough and wheezing. Negative for shortness of breath.   Cardiovascular: Negative for chest pain.  Gastrointestinal: Negative for abdominal pain, constipation, diarrhea, nausea and vomiting.  Musculoskeletal: Positive for back pain. Negative for arthralgias, myalgias and neck pain.  Skin: Negative for rash.  Allergic/Immunologic: Negative for immunocompromised state.  Neurological: Negative for dizziness and weakness.  Hematological: Negative for adenopathy.  Psychiatric/Behavioral: Negative for confusion.  All other systems reviewed and are negative.    Physical Exam Updated Vital Signs BP (!) 99/52   Pulse 94   Temp 98.6 F (37 C) (Oral)   Resp 17   Ht 5' (1.524  m)   Wt 66.7 kg   SpO2 100%   BMI 28.71 kg/m   Physical Exam Vitals signs and nursing note reviewed.  Constitutional:      General: She is not in acute distress.    Appearance: Normal appearance. She is well-developed. She is not diaphoretic.  HENT:     Head: Normocephalic and atraumatic.     Right Ear: Tympanic membrane and ear canal normal.     Left Ear: Tympanic membrane and ear canal normal.     Nose: Congestion present.     Mouth/Throat:     Mouth: Mucous membranes are moist.     Pharynx: Posterior oropharyngeal erythema present. No oropharyngeal exudate.     Tonsils: No tonsillar exudate or tonsillar abscesses. Swelling: 2+ on the right. 1+ on the left.  Eyes:     General:        Right eye: No discharge.        Left eye: No discharge.  Neck:     Musculoskeletal: Neck supple. No muscular tenderness.  Cardiovascular:     Rate and Rhythm: Normal rate and regular rhythm.     Heart sounds: Normal heart sounds.  Pulmonary:     Effort: Pulmonary effort is normal.     Breath sounds: Normal breath sounds. No wheezing.  Lymphadenopathy:     Cervical: No cervical adenopathy.  Skin:    General: Skin is warm and dry.     Findings: No rash.  Neurological:     Mental Status: She is alert and oriented to person, place, and time.  Psychiatric:        Mood and Affect: Mood normal.        Behavior: Behavior normal.      ED Treatments / Results  Labs (all labs ordered are listed, but only abnormal results are displayed) Labs Reviewed  GROUP A STREP BY PCR    EKG None  Radiology Dg Chest 2 View  Result Date: 05/06/2018 CLINICAL DATA:  Cough and fever EXAM: CHEST - 2 VIEW COMPARISON:  February 01, 2018 FINDINGS: No edema or consolidation. Heart size and pulmonary vascularity are normal. No adenopathy. No bone lesions. IMPRESSION: No edema or consolidation. Electronically Signed   By: Bretta Bang III M.D.   On: 05/06/2018 13:50    Procedures Procedures (including  critical care time)  Medications Ordered in ED Medications - No data to display   Initial Impression / Assessment and Plan / ED Course  I have reviewed the triage vital signs and the nursing notes.  Pertinent labs & imaging results that were available during my care of the patient were reviewed by me and considered in my medical decision making (see chart for details). Clinical Course as of May 06 1499  Fri May 06, 2018  [id number] 22 year old female presents with complaint of cough and  congestion for the past week and a half.  She states her daughter was diagnosed with pneumonia on chest x-ray yesterday and she is concerned she may also pneumonia.  Patient's lung sounds are clear, she has had mild erythema of her throat without exudate.  Her strep test is negative her chest x-ray is negative for pneumonia.  Recommend supportive therapy as well as prescription for Tessalon and Zyrtec-D.  Patient should recheck with primary care doctor, return to ER for new or worsening symptoms.   [LM]    Clinical Course User Index [LM] Jeannie FendMurphy, Laura A, PA-C   Final Clinical Impressions(s) / ED Diagnoses   Final diagnoses:  Upper respiratory tract infection, unspecified type    ED Discharge Orders         Ordered    benzonatate (TESSALON) 100 MG capsule  Every 8 hours     05/06/18 1457    cetirizine-pseudoephedrine (ZYRTEC-D) 5-120 MG tablet  Daily     05/06/18 1457           Jeannie FendMurphy, Laura A, PA-C 05/06/18 1500    Alvira MondaySchlossman, Erin, MD 05/08/18 [phone]

## 2018-05-06 NOTE — ED Triage Notes (Signed)
Pts reports daughter has been sick no has runny nose, body aches, cough and fever

## 2018-05-06 NOTE — Discharge Instructions (Addendum)
Your chest x-ray is normal, you do not have pneumonia. Your strep test is negative.  Use a saline sinus rinse twice daily. Take Zyrtec-D as prescribed as needed. Use Flonase daily. Take Tessalon as needed as prescribed for cough.  Recheck with your doctor.

## 2018-05-06 NOTE — ED Notes (Signed)
Patient transported to X-ray 

## 2018-06-05 IMAGING — US US OB TRANSVAGINAL
1 series · 15 of 28 positions shown · non-contrast
Comparison: 05/12/2016

CLINICAL DATA: Follow-up vaginal cramping

EXAM:
OBSTETRIC <14 WK US AND TRANSVAGINAL OB US
TECHNIQUE: Both transabdominal and transvaginal ultrasound examinations were
performed for complete evaluation of the gestation as well as the
maternal uterus, adnexal regions, and pelvic cul-de-sac.
Transvaginal technique was performed to assess early pregnancy.

[Series 1: us ob transvaginal · 15 of 37 slices shown]
[im 1/37]
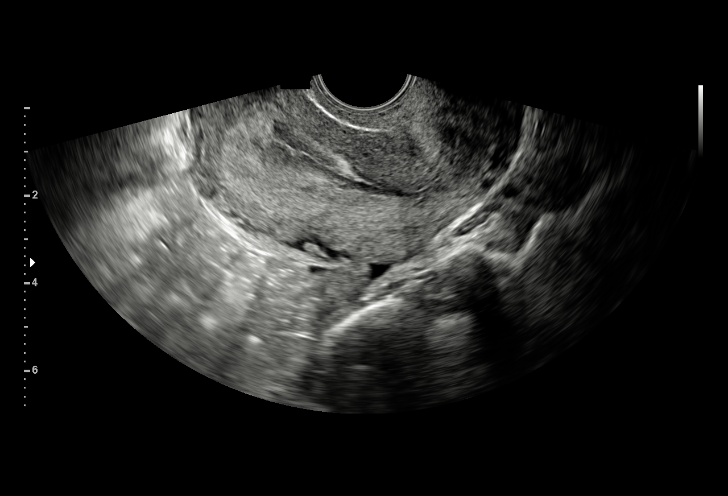
[im 3/37]
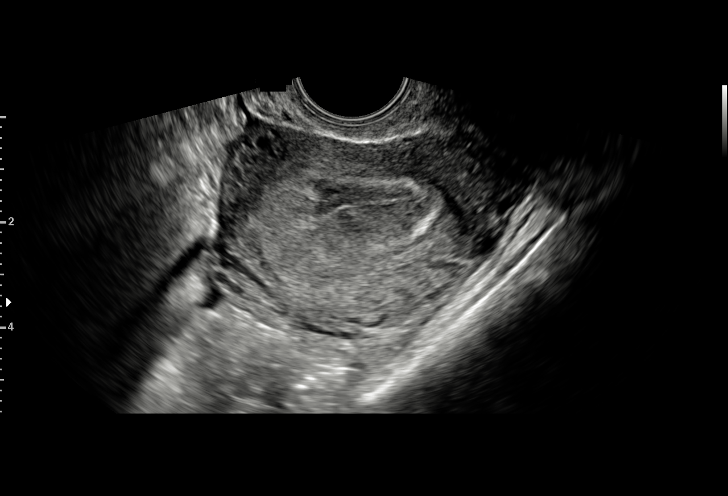
[im 6/37]
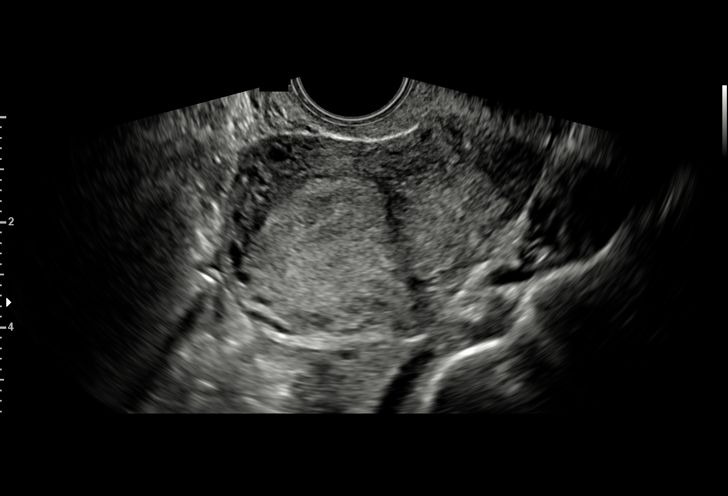
[im 9/37]
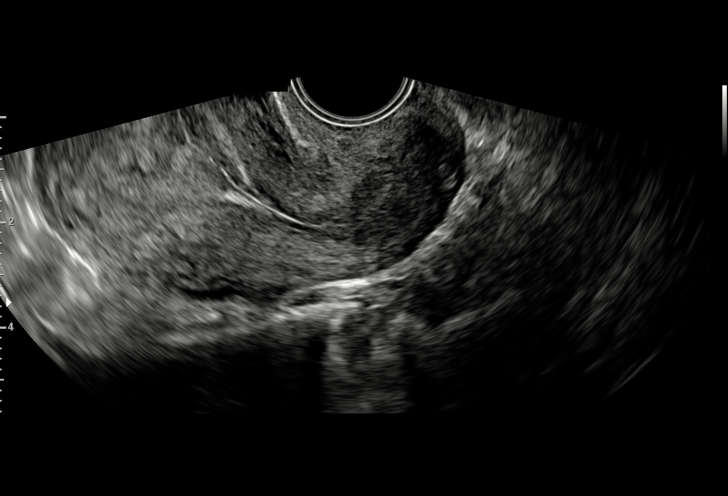
[im 11/37]
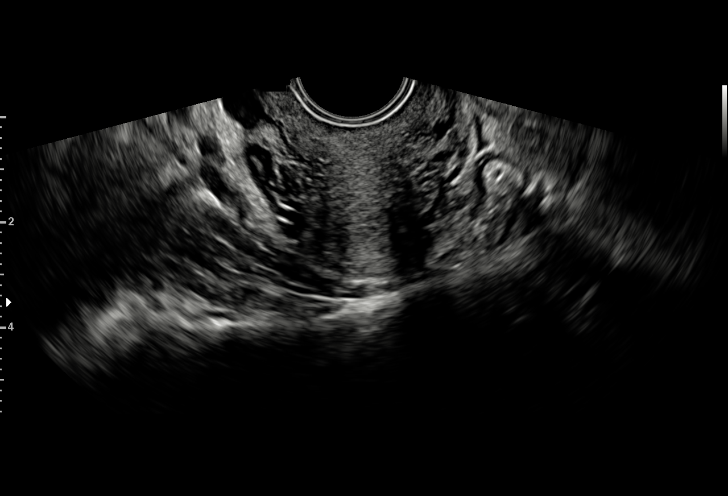
[im 14/37]
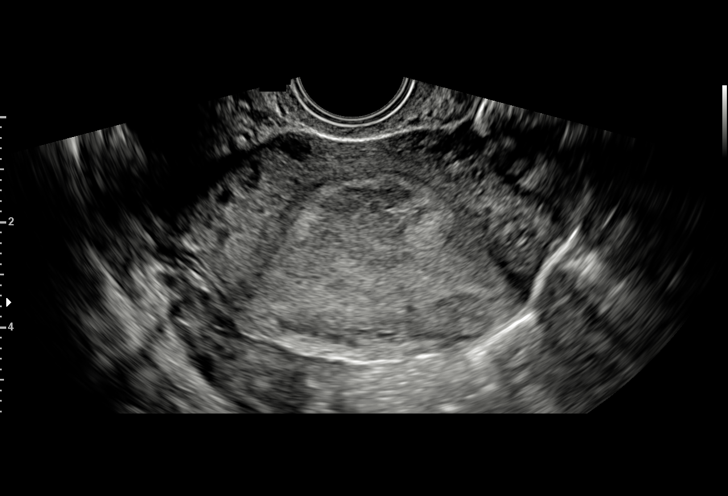
[im 17/37]
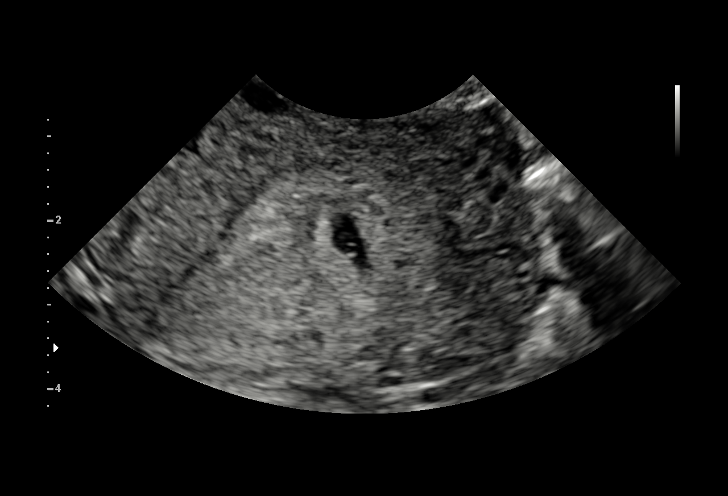
[im 19/37]
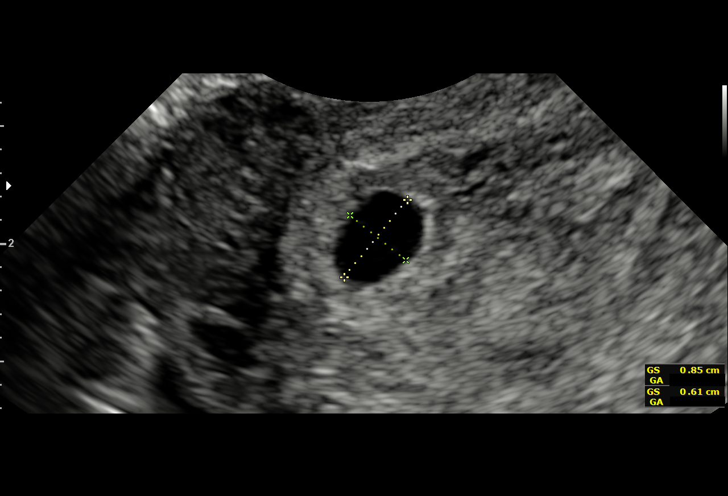
[im 21/37]
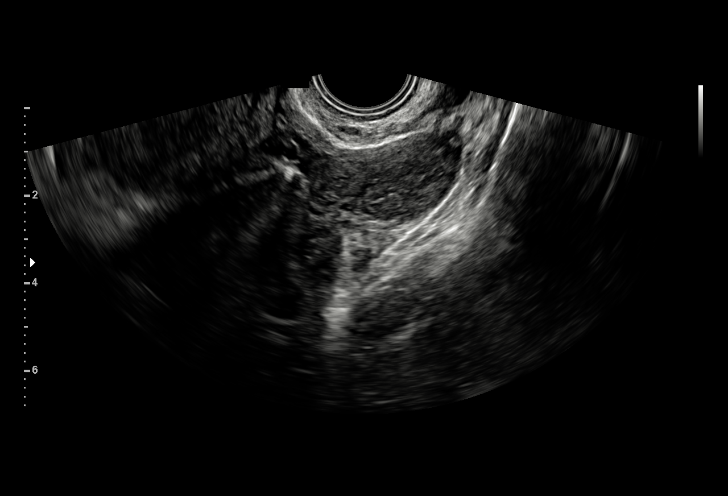
[im 23/37]
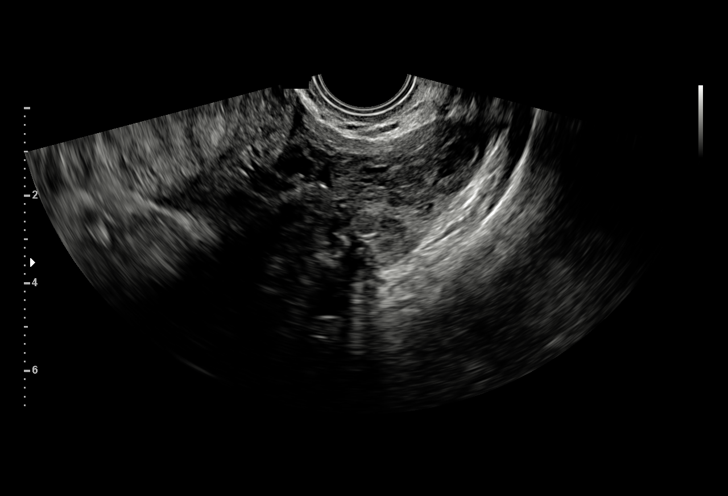
[im 26/37]
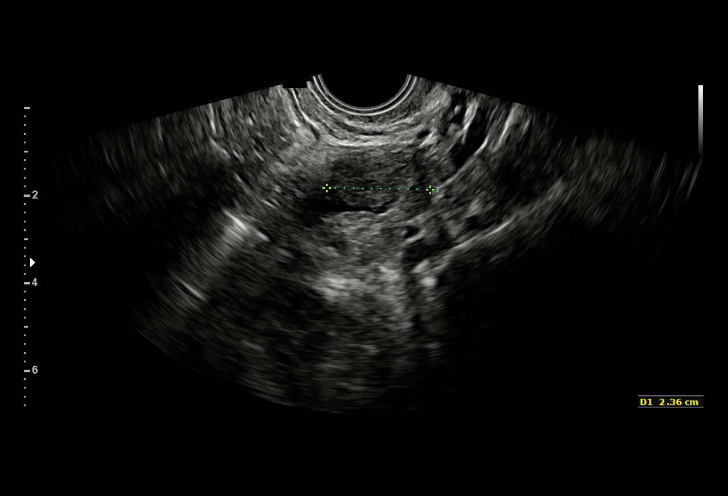
[im 29/37]
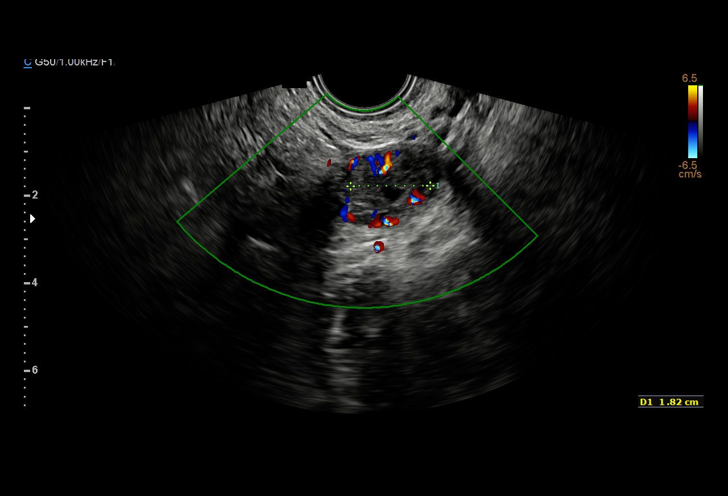
[im 31/37]
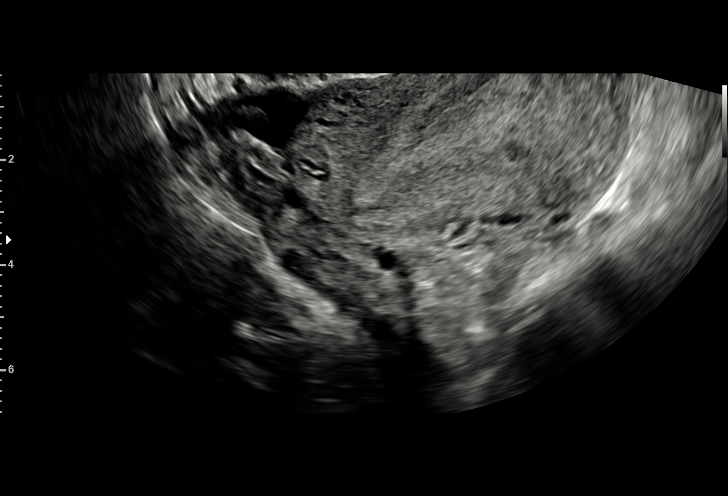
[im 34/37]
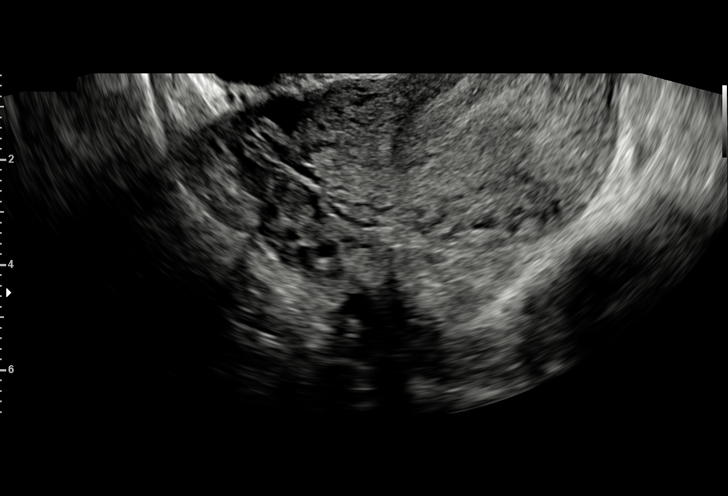
[im 37/37]
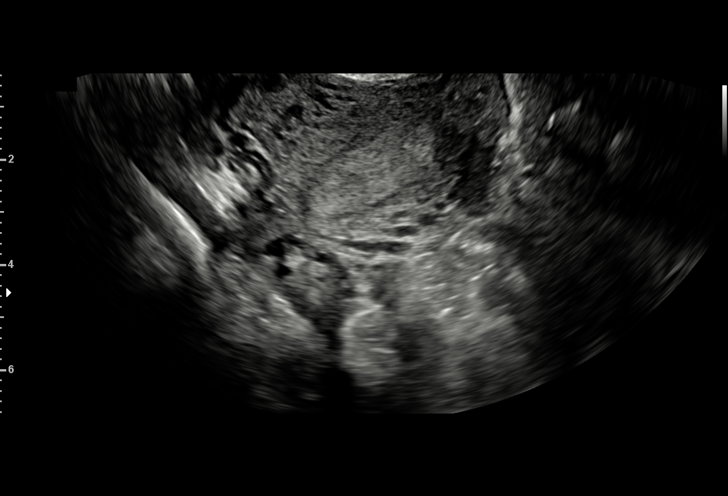

[15 of 28 positions shown; findings below may reference images not displayed]

FINDINGS: Intrauterine gestational sac: Single intrauterine gestational sac is
visualized.

Yolk sac:  Visualized

Embryo:  Not visualized

Cardiac Activity: Not visualized

MSD: 6.8  mm   5 w   2  d

Subchorionic hemorrhage:  None visualized.

Maternal uterus/adnexae: Bilateral ovaries are within normal limits.
The left ovary measures 4.4 x 2.4 by 2.4 cm. 2.3 cm probable corpus
luteal cyst. Right ovary measures 3.8 x 1.5 x 1.3 cm. No free fluid.
IMPRESSION: 1. Single intrauterine gestational sac is visualized, a yolk sac is
now identified. No fetal pole yet identified.
2. Otherwise negative examination.

## 2018-10-31 ENCOUNTER — Emergency Department (HOSPITAL_COMMUNITY)
Admission: EM | Admit: 2018-10-31 | Discharge: 2018-10-31 | Disposition: A | Discharge status: Home / Self Care | Payer: Medicaid Other | Attending: Emergency Medicine | Admitting: Emergency Medicine

## 2018-10-31 ENCOUNTER — Encounter (HOSPITAL_COMMUNITY): Payer: Self-pay | Admitting: Emergency Medicine

## 2018-10-31 ENCOUNTER — Emergency Department (HOSPITAL_COMMUNITY): Payer: Medicaid Other

## 2018-10-31 ENCOUNTER — Other Ambulatory Visit: Payer: Self-pay

## 2018-10-31 DIAGNOSIS — E039 Hypothyroidism, unspecified: Secondary | ICD-10-CM | POA: Diagnosis not present

## 2018-10-31 DIAGNOSIS — Y939 Activity, unspecified: Secondary | ICD-10-CM | POA: Diagnosis not present

## 2018-10-31 DIAGNOSIS — Y929 Unspecified place or not applicable: Secondary | ICD-10-CM | POA: Insufficient documentation

## 2018-10-31 DIAGNOSIS — S62653A Nondisplaced fracture of medial phalanx of left middle finger, initial encounter for closed fracture: Secondary | ICD-10-CM | POA: Insufficient documentation

## 2018-10-31 DIAGNOSIS — S63633A Sprain of interphalangeal joint of left middle finger, initial encounter: Secondary | ICD-10-CM | POA: Diagnosis not present

## 2018-10-31 DIAGNOSIS — Y999 Unspecified external cause status: Secondary | ICD-10-CM | POA: Insufficient documentation

## 2018-10-31 DIAGNOSIS — Z79899 Other long term (current) drug therapy: Secondary | ICD-10-CM | POA: Insufficient documentation

## 2018-10-31 DIAGNOSIS — S6992XA Unspecified injury of left wrist, hand and finger(s), initial encounter: Secondary | ICD-10-CM | POA: Diagnosis present

## 2018-10-31 DIAGNOSIS — S62629A Displaced fracture of medial phalanx of unspecified finger, initial encounter for closed fracture: Secondary | ICD-10-CM

## 2018-10-31 DIAGNOSIS — W2209XA Striking against other stationary object, initial encounter: Secondary | ICD-10-CM | POA: Diagnosis not present

## 2018-10-31 MED ORDER — IBUPROFEN 200 MG PO TABS
600.0000 mg | ORAL_TABLET | Freq: Once | ORAL | Status: AC
  Administered 2018-10-31: 06:00:00 600 mg via ORAL
  Filled 2018-10-31: qty 3

## 2018-10-31 MED ORDER — IBUPROFEN 600 MG PO TABS: 600.0000 mg | ORAL_TABLET | Freq: Four times a day (QID) | ORAL | 0 refills | Status: DC | PRN

## 2018-10-31 NOTE — ED Provider Notes (Signed)
[facility] DEPT Provider Note   CSN: [id number] Arrival date & time: 10/31/18  0448     History   Chief Complaint Chief Complaint  Patient presents with  . Finger Injury    HPI Ashley Sparks is a 23 y.o. female.     Patient to ED with complaint of left middle finger pain and swelling since hitting against the trunk of her car last night. No other injury. She reports decreased sensation to the finger. No wound or bleeding.   The history is provided by the patient. No language interpreter was used.    Past Medical History:  Diagnosis Date  . Constipation   . Depression   . Gluten intolerance   . Hypothyroid   . Keloid   . Lactose intolerance   . PAC (premature atrial contraction) 08/24/2016  . Palpitations 07/01/2016  . PVC (premature ventricular contraction) 08/24/2016  . Seasonal allergies   . Shortness of breath 07/01/2016   typically during allergy season  . UTI (urinary tract infection)     Patient Active Problem List   Diagnosis Date Noted  . B12 deficiency 01/27/2017  . UTI (urinary tract infection) 01/20/2017  . Anemia complicating pregnancy in third trimester 11/16/2016  . 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 PAC (premature atrial contraction) 08/24/2016  . PVC (premature ventricular contraction) 08/24/2016  . 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 No pertinent surgical history.   OB History    Gravida  1   Para  1   Term  1   Preterm  0   AB  0   Living  1     SAB  0   TAB  0   Ectopic  0   Multiple  0   Live Births  1            Home Medications    Prior to Admission medications   Medication Sig Start Date End Date Taking? Authorizing Provider  benzonatate (TESSALON) 100 MG capsule Take 1 capsule (100 mg total) by mouth every 8 (eight) hours. 05/06/18   Tacy Learn, PA-C   cetirizine-pseudoephedrine (ZYRTEC-D) 5-120 MG tablet Take 1 tablet by mouth daily. 05/06/18   Tacy Learn, PA-C  fluticasone (FLONASE) 50 MCG/ACT nasal spray Place 2 sprays into both nostrils daily. 02/01/18   Couture, Cortni S, PA-C    Family History Family History  Adopted: Yes  Problem Relation Age of Onset  . Diabetes Mother   . Seizures Mother   . Hyperthyroidism Maternal Aunt   . Celiac disease Neg Hx   . Cholelithiasis Neg Hx   . Ulcers Neg Hx     Social History Social History   Tobacco Use  . Smoking status: Never Smoker  . Smokeless tobacco: Never Used  Substance Use Topics  . Alcohol use: No  . Drug use: No     Allergies   Lactose intolerance (gi), Gluten meal, and Macrobid [nitrofurantoin monohyd macro]   Review of Systems Review of Systems  Musculoskeletal:       See HPI.  Skin: Negative for wound.  Neurological: Positive for numbness.     Physical Exam Updated Vital Signs BP 107/70 (BP Location: Right Arm)   Pulse 77   Temp 98.5 F (36.9 C) (Oral)   Resp 14   Ht 5\' 1"  (1.549 m)   Wt 62.4 kg   SpO2 100%   BMI 26.00  kg/m   Physical Exam Constitutional:      General: She is not in acute distress.    Appearance: Normal appearance.  Pulmonary:     Effort: Pulmonary effort is normal.  Musculoskeletal:     Comments: Left middle finger is mildly swollen with palmar bruising. No bony deformity. She has a FROM of the finger at all joints.   Neurological:     Mental Status: She is alert.     Comments: Slight decrease in sensation of the dorsal and palmar left middle finger.       ED Treatments / Results  Labs (all labs ordered are listed, but only abnormal results are displayed) Labs Reviewed - No data to display  EKG    Radiology No results found. Dg Finger Middle Left  Result Date: 10/31/2018 CLINICAL DATA:  Injury.  Pain left middle finger. EXAM: LEFT MIDDLE FINGER 2+V COMPARISON:  07/25/2015. FINDINGS: Tiny slightly fracture  noted along the anterior base of the middle phalanx of the left third digit. No radiopaque foreign body. IMPRESSION: Tiny slightly displaced fracture noted along the anterior base of the middle phalanx of the left third digit. Electronically Signed   By: Maisie Fushomas  Register   On: 10/31/2018 06:55    Procedures Procedures (including critical care time)  Medications Ordered in ED Medications - No data to display   Initial Impression / Assessment and Plan / ED Course  I have reviewed the triage vital signs and the nursing notes.  Pertinent labs & imaging results that were available during my care of the patient were reviewed by me and considered in my medical decision making (see chart for details).        Patient to ED after same day left middle finger contusion injury. Imaging shows small avulsion fracture of middle phalanx. Splint applied. Discussed orthopedic follow up as needed if pain getting no better.   Final Clinical Impressions(s) / ED Diagnoses   Final diagnoses:  None   1. Left middle finger avulsion fracture 2. Finger sprain  ED Discharge Orders    None       Elpidio AnisUpstill, Elvyn Krohn, PA-C 10/31/18 [id number]    Gilda CreasePollina, Christopher J, MD 11/01/18 [phone]

## 2018-10-31 NOTE — ED Triage Notes (Signed)
Pt reports slamming hand on trunk last night and now having pain in middle left finger.

## 2018-10-31 NOTE — Discharge Instructions (Signed)
Wear splint for comfort. Take ibuprofen as directed for pain and inflammation.   Follow up with Dr. Mardelle Matte for recheck as needed if symptoms persist without improvement over the next week.

## 2018-10-31 NOTE — ED Notes (Signed)
X-ray at bedside

## 2018-11-01 ENCOUNTER — Emergency Department (HOSPITAL_COMMUNITY)
Admission: EM | Admit: 2018-11-01 | Discharge: 2018-11-01 | Disposition: A | Discharge status: Home / Self Care | Payer: Medicaid Other | Attending: Emergency Medicine | Admitting: Emergency Medicine

## 2018-11-01 ENCOUNTER — Encounter (HOSPITAL_COMMUNITY): Payer: Self-pay

## 2018-11-01 DIAGNOSIS — Y9389 Activity, other specified: Secondary | ICD-10-CM | POA: Diagnosis not present

## 2018-11-01 DIAGNOSIS — Z79899 Other long term (current) drug therapy: Secondary | ICD-10-CM | POA: Diagnosis not present

## 2018-11-01 DIAGNOSIS — S6992XA Unspecified injury of left wrist, hand and finger(s), initial encounter: Secondary | ICD-10-CM | POA: Diagnosis present

## 2018-11-01 DIAGNOSIS — S60122A Contusion of left index finger with damage to nail, initial encounter: Secondary | ICD-10-CM | POA: Diagnosis not present

## 2018-11-01 DIAGNOSIS — W228XXA Striking against or struck by other objects, initial encounter: Secondary | ICD-10-CM | POA: Insufficient documentation

## 2018-11-01 DIAGNOSIS — Y998 Other external cause status: Secondary | ICD-10-CM | POA: Insufficient documentation

## 2018-11-01 DIAGNOSIS — E039 Hypothyroidism, unspecified: Secondary | ICD-10-CM | POA: Insufficient documentation

## 2018-11-01 DIAGNOSIS — Y929 Unspecified place or not applicable: Secondary | ICD-10-CM | POA: Diagnosis not present

## 2018-11-01 DIAGNOSIS — S6010XA Contusion of unspecified finger with damage to nail, initial encounter: Secondary | ICD-10-CM

## 2018-11-01 NOTE — ED Provider Notes (Signed)
[facility]EM[facility] Provider Note   CSN: [id number] Arrival date & time: 11/01/18  1456     History   Chief Complaint Chief Complaint  Patient presents with  . Hand Pain    HPI Ashley Sparks is a 23 y.o. female.     The history is provided by the patient and medical records. No language interpreter was used.  Hand Pain   Ashley Sparks is a 23 y.o. female who presents to the emergency department for evaluation of left index finger nail.  She was here 2 days ago after hitting her hand against the trunk of her car.  She has known avulsion fracture to the long finger which is in a splint.  She states that she has not here for recheck of this and feels as if it is healing well.  She has fake nails on and was trying to take them off today.  When she began taking the nail off of her index finger, she noticed that the true nail underneath was halfway ripped off.  She came to ER for further evaluation of this.  She denies any pain to this area.  Past Medical History:  Diagnosis Date  . Constipation   . Depression   . Gluten intolerance   . Hypothyroid   . Keloid   . Lactose intolerance   . PAC (premature atrial contraction) 08/24/2016  . Palpitations 07/01/2016  . PVC (premature ventricular contraction) 08/24/2016  . Seasonal allergies   . Shortness of breath 07/01/2016   typically during allergy season  . UTI (urinary tract infection)     Patient Active Problem List   Diagnosis Date Noted  . B12 deficiency 01/27/2017  . UTI (urinary tract infection) 01/20/2017  . Anemia complicating pregnancy in third trimester 11/16/2016  . 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 PAC (premature atrial contraction) 08/24/2016  . PVC (premature ventricular contraction) 08/24/2016  . 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 No pertinent surgical history.   OB History    Gravida  1   Para  1   Term  1   Preterm  0   AB  0   Living  1     SAB  0   TAB  0   Ectopic  0   Multiple  0   Live Births  1            Home Medications    Prior to Admission medications   Medication Sig Start Date End Date Taking? Authorizing Provider  benzonatate (TESSALON) 100 MG capsule Take 1 capsule (100 mg total) by mouth every 8 (eight) hours. 05/06/18   Jeannie FendMurphy, Laura A, PA-C  cetirizine-pseudoephedrine (ZYRTEC-D) 5-120 MG tablet Take 1 tablet by mouth daily. 05/06/18   Jeannie FendMurphy, Laura A, PA-C  fluticasone (FLONASE) 50 MCG/ACT nasal spray Place 2 sprays into both nostrils daily. 02/01/18   Couture, Cortni S, PA-C  ibuprofen (ADVIL) 600 MG tablet Take 1 tablet (600 mg total) by mouth every 6 (six) hours as needed. 10/31/18   Elpidio AnisUpstill, Shari, PA-C    Family History Family History  Adopted: Yes  Problem Relation Age of Onset  . Diabetes Mother   . Seizures Mother   . Hyperthyroidism Maternal Aunt   . Celiac disease Neg Hx   . Cholelithiasis Neg Hx   . Ulcers Neg Hx     Social History Social History   Tobacco Use  .  Smoking status: Never Smoker  . Smokeless tobacco: Never Used  Substance Use Topics  . Alcohol use: No  . Drug use: No     Allergies   Lactose intolerance (gi), Gluten meal, and Macrobid [nitrofurantoin monohyd macro]   Review of Systems Review of Systems  Musculoskeletal: Positive for arthralgias.  Skin: Positive for wound.  Neurological: Negative for weakness.     Physical Exam Updated Vital Signs BP (!) 109/59 (BP Location: Right Arm)   Pulse 91   Temp 98.1 F (36.7 C) (Oral)   Resp 16   SpO2 100%   Physical Exam Vitals signs and nursing note reviewed.  Constitutional:      General: She is not in acute distress.    Appearance: She is well-developed.  HENT:     Head: Normocephalic and atraumatic.  Neck:     Musculoskeletal: Neck supple.  Cardiovascular:     Rate and Rhythm: Normal rate and regular rhythm.      Heart sounds: Normal heart sounds. No murmur.  Pulmonary:     Effort: Pulmonary effort is normal. No respiratory distress.     Breath sounds: Normal breath sounds. No wheezing or rales.  Musculoskeletal:     Comments: Left hand: Finger splint on long finger.  Fake nail to index finger.  Underneath, the distal nail edge is halfway ripped and fake nail hanging off from the lateral nail edge.  2+ radial pulse.  Sensation intact.  Good cap refill.  Skin:    General: Skin is warm and dry.  Neurological:     Mental Status: She is alert.      ED Treatments / Results  Labs (all labs ordered are listed, but only abnormal results are displayed) Labs Reviewed - No data to display  EKG    Radiology Dg Finger Middle Left  Result Date: 10/31/2018 CLINICAL DATA:  Injury.  Pain left middle finger. EXAM: LEFT MIDDLE FINGER 2+V COMPARISON:  07/25/2015. FINDINGS: Tiny slightly fracture noted along the anterior base of the middle phalanx of the left third digit. No radiopaque foreign body. IMPRESSION: Tiny slightly displaced fracture noted along the anterior base of the middle phalanx of the left third digit. Electronically Signed   By: Maisie Fushomas  Register   On: 10/31/2018 06:55    Procedures Procedures (including critical care time)  Medications Ordered in ED Medications - No data to display   Initial Impression / Assessment and Plan / ED Course  I have reviewed the triage vital signs and the nursing notes.  Pertinent labs & imaging results that were available during my care of the patient were reviewed by me and considered in my medical decision making (see chart for details).       Ashley Sparks is a 23 y.o. female who presents to ED for evaluation of injury to left index finger.  She was taking her fake nails off and noticed that the true nail underneath was ripped.  On exam, neurovascularly intact.  Chart reviewed from previous visit 2 days ago for hand contusion.  X-ray reviewed with  known avulsion fracture.  This is in splint.  She expresses understanding of home care and follow-up needs in regards to this and has no questions.  With her left index finger, her fake nail is hanging off, attached to true nail lateral distal edge but just a few millimeters.  I took the fake nail off and trimmed the true nail down.  Does not appear that she would have any nailbed injury  as the part of the nail covering the bed is still fully intact. Evaluation does not show pathology that would require ongoing emergent intervention or inpatient treatment. Return precautions discussed. Home care instructions and follow-up care again discussed and all questions answered.   Final Clinical Impressions(s) / ED Diagnoses   Final diagnoses:  Contusion of fingernail, initial encounter    ED Discharge Orders    None       Ward, Ozella Almond, PA-C 11/01/18 1746    Gareth Morgan, MD 11/03/18 [phone]

## 2018-11-01 NOTE — ED Triage Notes (Signed)
Patient c/o finger pain. Patient states she was trying to take her fake nails off and ripped the real nail off of her pointer finger on left hand.    Patient also broke her middle finger 2 days ago but is not here for that.     A/ox4 Ambulatory in triage.

## 2018-11-01 NOTE — Discharge Instructions (Signed)
It was my pleasure taking care of you today!  Keep wound clean and dry. Wash several times a day.   Return to ER for new or worsening symptoms, any additional concerns.

## 2018-11-01 NOTE — ED Notes (Signed)
Bed: WTR7 Expected date:  Expected time:  Means of arrival:  Comments: 

## 2018-11-05 ENCOUNTER — Other Ambulatory Visit: Payer: Self-pay

## 2018-11-05 ENCOUNTER — Encounter (HOSPITAL_COMMUNITY): Payer: Self-pay

## 2018-11-05 ENCOUNTER — Emergency Department (HOSPITAL_COMMUNITY): Payer: Medicaid Other

## 2018-11-05 ENCOUNTER — Emergency Department (HOSPITAL_COMMUNITY)
Admission: EM | Admit: 2018-11-05 | Discharge: 2018-11-05 | Disposition: A | Discharge status: Home / Self Care | Payer: Medicaid Other | Attending: Emergency Medicine | Admitting: Emergency Medicine

## 2018-11-05 DIAGNOSIS — Y929 Unspecified place or not applicable: Secondary | ICD-10-CM | POA: Insufficient documentation

## 2018-11-05 DIAGNOSIS — E039 Hypothyroidism, unspecified: Secondary | ICD-10-CM | POA: Diagnosis not present

## 2018-11-05 DIAGNOSIS — Y998 Other external cause status: Secondary | ICD-10-CM | POA: Insufficient documentation

## 2018-11-05 DIAGNOSIS — H538 Other visual disturbances: Secondary | ICD-10-CM | POA: Insufficient documentation

## 2018-11-05 DIAGNOSIS — M542 Cervicalgia: Secondary | ICD-10-CM | POA: Diagnosis not present

## 2018-11-05 DIAGNOSIS — Y9389 Activity, other specified: Secondary | ICD-10-CM | POA: Diagnosis not present

## 2018-11-05 LAB — I-STAT BETA HCG BLOOD, ED (MC, WL, AP ONLY): I-stat hCG, quantitative: <5 m[IU]/mL (ref <5)

## 2018-11-05 MED ORDER — ACETAMINOPHEN 500 MG PO TABS
1000.0000 mg | ORAL_TABLET | Freq: Once | ORAL | Status: AC
  Administered 2018-11-05: 1000 mg via ORAL
  Filled 2018-11-05: qty 2

## 2018-11-05 MED ORDER — NAPROXEN 375 MG PO TABS: 375.0000 mg | ORAL_TABLET | Freq: Two times a day (BID) | ORAL | 0 refills | Status: DC

## 2018-11-05 MED ORDER — IBUPROFEN 800 MG PO TABS
800.0000 mg | ORAL_TABLET | Freq: Once | ORAL | Status: AC
  Administered 2018-11-05: 800 mg via ORAL
  Filled 2018-11-05: qty 1
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
## 2018-11-05 NOTE — ED Notes (Signed)
Patient transported to CT 
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
## 2018-11-09 DIAGNOSIS — Z8619 Personal history of other infectious and parasitic diseases: Secondary | ICD-10-CM

## 2018-11-09 HISTORY — DX: Personal history of other infectious and parasitic diseases: Z86.19

## 2019-07-31 ENCOUNTER — Emergency Department (HOSPITAL_COMMUNITY)
Admission: EM | Admit: 2019-07-31 | Discharge: 2019-07-31 | Disposition: A | Discharge status: Home / Self Care | Payer: Medicaid Other | Attending: Emergency Medicine | Admitting: Emergency Medicine

## 2019-07-31 ENCOUNTER — Emergency Department (HOSPITAL_COMMUNITY): Payer: Medicaid Other

## 2019-07-31 DIAGNOSIS — E039 Hypothyroidism, unspecified: Secondary | ICD-10-CM | POA: Insufficient documentation

## 2019-07-31 DIAGNOSIS — R0981 Nasal congestion: Secondary | ICD-10-CM | POA: Insufficient documentation

## 2019-07-31 DIAGNOSIS — R112 Nausea with vomiting, unspecified: Secondary | ICD-10-CM | POA: Diagnosis not present

## 2019-07-31 DIAGNOSIS — Z79899 Other long term (current) drug therapy: Secondary | ICD-10-CM | POA: Insufficient documentation

## 2019-07-31 DIAGNOSIS — Z20822 Contact with and (suspected) exposure to covid-19: Secondary | ICD-10-CM | POA: Insufficient documentation

## 2019-07-31 DIAGNOSIS — R05 Cough: Secondary | ICD-10-CM

## 2019-07-31 DIAGNOSIS — R059 Cough, unspecified: Secondary | ICD-10-CM

## 2019-07-31 LAB — SARS CORONAVIRUS 2 (TAT 6-24 HRS): SARS Coronavirus 2: NEGATIVE

## 2019-07-31 MED ORDER — ONDANSETRON 4 MG PO TBDP
4.0000 mg | ORAL_TABLET | Freq: Once | ORAL | Status: AC
  Administered 2019-07-31: 17:00:00 4 mg via ORAL
  Filled 2019-07-31: qty 1

## 2019-07-31 MED ORDER — AFRIN NASAL SPRAY 0.05 % NA SOLN: 1.0000 | Freq: Two times a day (BID) | NASAL | 0 refills | Status: AC

## 2019-07-31 MED ORDER — ONDANSETRON HCL 4 MG PO TABS: 4.0000 mg | ORAL_TABLET | Freq: Three times a day (TID) | ORAL | 0 refills | Status: DC | PRN

## 2019-07-31 NOTE — ED Triage Notes (Signed)
Pt here from home with c/o cold symptoms no fever . Pt thinks it may just be seasonal allergies , no recent covid exposures

## 2019-07-31 NOTE — ED Provider Notes (Signed)
[facility] EMERGENCY DEPARTMENT Provider Note   CSN: [id number] Arrival date & time: 07/31/19  1326     History No chief complaint on file.   Ashley Sparks is a 24 y.o. female presenting for evaluation of nasal congestion, cough, nausea, vomiting.  Patient states her symptoms began 2 days ago.  She reports nasal congestion 1 episode of epistaxis.  She has a mild nonproductive cough.  She reports associated nausea and multiple episodes of emesis today.  No hematemesis.  She states she has felt hot and cold today, but no objective fever.  She usually thought her symptoms were due to allergies, as such as taking Xyzal and using her inhaler as needed.  Patient states her daughter also became sick 3 days ago.  Her daughter is in daycare, had a positive Covid contact 2 weeks ago.  Patient states she has no other medical problems, takes medications daily.  No history of asthma or COPD.  She does not smoke cigarettes.  HPI     Past Medical History:  Diagnosis Date  . Constipation   . Depression   . Gluten intolerance   . Hypothyroid   . Keloid   . Lactose intolerance   . PAC (premature atrial contraction) 08/24/2016  . Palpitations 07/01/2016  . PVC (premature ventricular contraction) 08/24/2016  . Seasonal allergies   . Shortness of breath 07/01/2016   typically during allergy season  . UTI (urinary tract infection)     Patient Active Problem List   Diagnosis Date Noted  . B12 deficiency 01/27/2017  . UTI (urinary tract infection) 01/20/2017  . Anemia complicating pregnancy in third trimester 11/16/2016  . 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 PAC (premature atrial contraction) 08/24/2016  . PVC (premature ventricular contraction) 08/24/2016  . 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   OB History    Gravida  1   Para  1   Term  1   Preterm  0   AB  0   Living  1     SAB  0   TAB  0   Ectopic  0   Multiple  0   Live Births  1           Family History  Adopted: Yes  Problem Relation Age of Onset  . Diabetes Mother   . Seizures Mother   . Hyperthyroidism Maternal Aunt   . Celiac disease Neg Hx   . Cholelithiasis Neg Hx   . Ulcers Neg Hx     Social History   Tobacco Use  . Smoking status: Never Smoker  . Smokeless tobacco: Never Used  Substance Use Topics  . Alcohol use: No  . Drug use: No    Home Medications Prior to Admission medications   Medication Sig Start Date End Date Taking? Authorizing Provider  benzonatate (TESSALON) 100 MG capsule Take 1 capsule (100 mg total) by mouth every 8 (eight) hours. Patient not taking: Reported on 11/05/2018 05/06/18   Jeannie Fend, PA-C  cetirizine-pseudoephedrine (ZYRTEC-D) 5-120 MG tablet Take 1 tablet by mouth daily. Patient not taking: Reported on 11/05/2018 05/06/18   Army Melia A, PA-C  fluticasone [facility]) 50 MCG/ACT nasal spray Place 2 sprays into both nostrils daily. Patient not taking: Reported on 11/05/2018 02/01/18   Couture, Cortni S, PA-C  ibuprofen (ADVIL) 200 MG tablet Take 400 mg by mouth every 6 (six) hours  as needed for moderate pain.    [provider]  ibuprofen (ADVIL) 600 MG tablet Take 1 tablet (600 mg total) by mouth every 6 (six) hours as needed. Patient not taking: Reported on 11/05/2018 10/31/18   Elpidio Anis, PA-C  naproxen (NAPROSYN) 375 MG tablet Take 1 tablet (375 mg total) by mouth 2 (two) times daily. 11/05/18   Palumbo, April, MD  ondansetron (ZOFRAN) 4 MG tablet Take 1 tablet (4 mg total) by mouth every 8 (eight) hours as needed for nausea or vomiting. 07/31/19   Artie Takayama, PA-C  oxymetazoline (AFRIN NASAL SPRAY) 0.05 % nasal spray Place 1 spray into both nostrils 2 (two) times daily for 3 days. 07/31/19 08/03/19  Gema Ringold, PA-C    Allergies    Lactose intolerance (gi), Gluten meal, and Macrobid  [nitrofurantoin monohyd macro]  Review of Systems   Review of Systems  Constitutional: Positive for chills (Subjective) and fever (Subjective).  HENT: Positive for nosebleeds and rhinorrhea.   Respiratory: Positive for cough.   Gastrointestinal: Positive for nausea and vomiting.  All other systems reviewed and are negative.   Physical Exam Updated Vital Signs BP 115/68   Pulse 75   Temp 98.7 F (37.1 C) (Oral)   Resp 16   SpO2 99%   Physical Exam Vitals and nursing note reviewed.  Constitutional:      General: She is not in acute distress.    Appearance: She is well-developed.     Comments: Resting comfortably in the bed in no acute distress  HENT:     Head: Normocephalic and atraumatic.     Comments: OP mildly erythematous with cobblestoning, no tonsillar swelling or exudate.  Uvula midline.  No trismus.  Handling secretions easily. Nasal mucosal edema.  No sinus tenderness or pressure.  No epistaxis currently.    Right Ear: Tympanic membrane, ear canal and external ear normal.     Left Ear: Tympanic membrane, ear canal and external ear normal.     Nose: Mucosal edema, congestion and rhinorrhea present.     Right Sinus: No maxillary sinus tenderness or frontal sinus tenderness.     Left Sinus: No maxillary sinus tenderness or frontal sinus tenderness.     Mouth/Throat:     Pharynx: Uvula midline.     Tonsils: No tonsillar exudate.  Eyes:     Conjunctiva/sclera: Conjunctivae normal.     Pupils: Pupils are equal, round, and reactive to light.  Cardiovascular:     Rate and Rhythm: Normal rate and regular rhythm.     Pulses: Normal pulses.  Pulmonary:     Effort: Pulmonary effort is normal.     Breath sounds: Normal breath sounds. No decreased breath sounds, wheezing, rhonchi or rales.     Comments: Clear lung sounds in all fields.  Speaking in full sentences. Abdominal:     General: There is no distension.     Palpations: Abdomen is soft.     Tenderness: There is no  abdominal tenderness.     Comments: No tenderness palpation of the abdomen.  Musculoskeletal:        General: Normal range of motion.     Cervical back: Normal range of motion.  Lymphadenopathy:     Cervical: No cervical adenopathy.  Skin:    General: Skin is warm.     Capillary Refill: Capillary refill takes less than 2 seconds.  Neurological:     Mental Status: She is alert and oriented to person, place, and time.  ED Results / Procedures / Treatments   Labs (all labs ordered are listed, but only abnormal results are displayed) Labs Reviewed  SARS CORONAVIRUS 2 (TAT 6-24 HRS)    EKG None  Radiology DG Chest 2 View  Result Date: 07/31/2019 CLINICAL DATA:  Cold symptoms, no fever EXAM: CHEST - 2 VIEW COMPARISON:  None. FINDINGS: The heart size and mediastinal contours are within normal limits. Both lungs are clear. The visualized skeletal structures are unremarkable. IMPRESSION: No active cardiopulmonary disease. Electronically Signed   By: Kathreen Devoid   On: 07/31/2019 15:33    Procedures Procedures (including critical care time)  Medications Ordered in ED Medications  ondansetron (ZOFRAN-ODT) disintegrating tablet 4 mg (4 mg Oral Given 07/31/19 1630)    ED Course  I have reviewed the triage vital signs and the nursing notes.  Pertinent labs & imaging results that were available during my care of the patient were reviewed by me and considered in my medical decision making (see chart for details).    MDM Rules/Calculators/A&P                      Patient presenting for evaluation 2-day history of URI symptoms.  On exam, patient appears nontoxic.  Respiratory exam is reassuring.  X-ray obtained from triage viewed interpreted by me, no pneumonia pneumothorax and effusion.  Likely viral illness, as patient also has nausea and vomiting, although consider allergies due to nasal congestion.  As patient's daughter has had a positive Covid exposure, will obtain Covid test.   Discussed continued symptomatic treatment.  Will give Zofran and p.o. challenge in the ED, patient without nausea or abdominal pain at this time.  I do not believe she needs labs or emergent CT.   Patient tolerated p.o. without difficulty.  Discussed quarantine until Covid results have returned and close monitoring of symptoms.  At this time, patient appears safe for discharge.  Return precautions given.  Patient states she understands and agrees to plan.  Final Clinical Impression(s) / ED Diagnoses Final diagnoses:  Nasal congestion  Cough  Non-intractable vomiting with nausea, unspecified vomiting type    Rx / DC Orders ED Discharge Orders         Ordered    ondansetron (ZOFRAN) 4 MG tablet  Every 8 hours PRN     07/31/19 1630    oxymetazoline (AFRIN NASAL SPRAY) 0.05 % nasal spray  2 times daily     07/31/19 [place], Mathieu Schloemer, PA-C 07/31/19 1630    Davonna Belling, MD 07/31/19 [phone]

## 2019-07-31 NOTE — Discharge Instructions (Signed)
You likely have a viral illness and/or allergies.  This should be treated symptomatically. Continue taking Xyzal daily. Use Tylenol or ibuprofen as needed for fevers or body aches. Use Afrin twice a day as needed for the next 3 days.  Do not use longer than 3 days as your nose can become addicted to this.  Use Zofran as needed for nausea or vomiting. Make sure you stay well-hydrated with water. You were tested for coronavirus today.  Results should return within 24 hours.  If positive, you receive a phone call.  If negative, you will not.  Either way, you may check online MyChart. You should quarantine until you know the results of your Covid test.  If negative, you can end quarantine.  If positive, you will need to remain in quarantine for a total of 10 days since symptom onset.  You can end quarantine if you are fever free for 24 hours and your symptoms are improving. Follow-up with your primary care doctor in 1 week if your symptoms are not improving. Return to the emergency room if you develop chest pain, difficulty breathing, or any new or worsening symptoms.

## 2019-07-31 NOTE — ED Notes (Signed)
Pt d/c home per MD order. Discharge summary reviewed with pt, pt verbalizes understanding. E-signature not available. Off unit via wheelchair. Reports discharge ride home.

## 2020-01-30 ENCOUNTER — Other Ambulatory Visit: Payer: Self-pay

## 2020-01-30 ENCOUNTER — Encounter: Payer: Self-pay | Admitting: Obstetrics and Gynecology

## 2020-01-30 ENCOUNTER — Ambulatory Visit (INDEPENDENT_AMBULATORY_CARE_PROVIDER_SITE_OTHER): Payer: Medicaid Other | Admitting: Obstetrics and Gynecology

## 2020-01-30 ENCOUNTER — Other Ambulatory Visit (HOSPITAL_COMMUNITY)
Admission: RE | Admit: 2020-01-30 | Discharge: 2020-01-30 | Disposition: A | Discharge status: Home / Self Care | Payer: Medicaid Other | Source: Ambulatory Visit | Attending: Obstetrics and Gynecology | Admitting: Obstetrics and Gynecology

## 2020-01-30 DIAGNOSIS — Z23 Encounter for immunization: Secondary | ICD-10-CM

## 2020-01-30 DIAGNOSIS — Z202 Contact with and (suspected) exposure to infections with a predominantly sexual mode of transmission: Secondary | ICD-10-CM

## 2020-01-30 DIAGNOSIS — Z3046 Encounter for surveillance of implantable subdermal contraceptive: Secondary | ICD-10-CM

## 2020-01-30 DIAGNOSIS — Z01419 Encounter for gynecological examination (general) (routine) without abnormal findings: Secondary | ICD-10-CM | POA: Insufficient documentation

## 2020-01-30 NOTE — Progress Notes (Signed)
Patient presents for AEX and Nexplanon removal. Patient would like a full std screening along with bv and yeast.. She denies having any vaginal discharge, odor, or irritation. No concerns today.   Nexplanon placed on 01/14/2017 in hospital after delivery. She does not want to go on another method of contraception at this time.  Flu vaccine given today

## 2020-01-30 NOTE — Progress Notes (Signed)
Patient ID: Ashley Sparks, female   DOB: 11/17/1995, 24 y.o.   MRN: [id number]  Ashley Sparks is a 24 y.o. G23P1001 female here for a routine annual gynecologic exam.  Current complaints: desire to have Implanon removed. Inserted 01/14/17.   Denies abnormal vaginal bleeding, discharge, pelvic pain, problems with intercourse or other gynecologic concerns.    Gynecologic History Patient's last menstrual period was 01/25/2020. Contraception: Nexplanon  Last Pap: NA.  Last mammogram: NA.   Obstetric History OB History  Gravida Para Term Preterm AB Living  1 1 1  0 0 1  SAB TAB Ectopic Multiple Live Births  0 0 0 0 1    # Outcome Date GA Lbr Len/2nd Weight Sex Delivery Anes PTL Lv  1 Term 01/12/17 [redacted]w[redacted]d 19:46 / 00:26 7 lb 12.9 oz (3.541 kg) F Vag-Spont EPI  LIV     Birth Comments: wnl    Past Medical History:  Diagnosis Date  . Constipation   . Depression   . Gluten intolerance   . Hypothyroid   . Keloid   . Lactose intolerance   . PAC (premature atrial contraction) 08/24/2016  . Palpitations 07/01/2016  . PVC (premature ventricular contraction) 08/24/2016  . Seasonal allergies   . Shortness of breath 07/01/2016   typically during allergy season  . UTI (urinary tract infection)     History reviewed. No pertinent surgical history.  No current outpatient medications on file prior to visit.   No current facility-administered medications on file prior to visit.    Allergies  Allergen Reactions  . Lactose Intolerance (Gi) Diarrhea and Other (See Comments)    Gi upset   . Gluten Meal Hives, Diarrhea and Other (See Comments)    And abdominal pain  . Macrobid [Nitrofurantoin Monohyd Macro] Hives and Rash    Social History   Socioeconomic History  . Marital status: Single    Spouse name: Not on file  . Number of children: Not on file  . Years of education: Not on file  . Highest education level: Not on file  Occupational History  . Not on file  Tobacco Use  . Smoking  status: Never Smoker  . Smokeless tobacco: Never Used  Vaping Use  . Vaping Use: Never used  Substance and Sexual Activity  . Alcohol use: No  . Drug use: No  . Sexual activity: Yes    Partners: Male    Birth control/protection: Implant, None  Other Topics Concern  . Not on file  Social History Narrative   10th grade (online)   Social Determinants of Health   Financial Resource Strain:   . Difficulty of Paying Living Expenses: Not on file  Food Insecurity:   . Worried About 07/03/2016 in the Last Year: Not on file  . Ran Out of Food in the Last Year: Not on file  Transportation Needs:   . Lack of Transportation (Medical): Not on file  . Lack of Transportation (Non-Medical): Not on file  Physical Activity:   . Days of Exercise per Week: Not on file  . Minutes of Exercise per Session: Not on file  Stress:   . Feeling of Stress : Not on file  Social Connections:   . Frequency of Communication with Friends and Family: Not on file  . Frequency of Social Gatherings with Friends and Family: Not on file  . Attends Religious Services: Not on file  . Active Member of Clubs or Organizations: Not on file  . Attends Club  or Organization Meetings: Not on file  . Marital Status: Not on file  Intimate Partner Violence:   . Fear of Current or Ex-Partner: Not on file  . Emotionally Abused: Not on file  . Physically Abused: Not on file  . Sexually Abused: Not on file    Family History  Adopted: Yes  Problem Relation Age of Onset  . Diabetes Mother   . Seizures Mother   . Hyperthyroidism Maternal Aunt   . Celiac disease Neg Hx   . Cholelithiasis Neg Hx   . Ulcers Neg Hx     The following portions of the patient's history were reviewed and updated as appropriate: allergies, current medications, past family history, past medical history, past social history, past surgical history and problem list.  Review of Systems Pertinent items noted in HPI and remainder of  comprehensive ROS otherwise negative.   Objective:  BP 119/70   Pulse 81   Ht 5\' 1"  (1.549 m)   Wt 138 lb 6.4 oz (62.8 kg)   LMP 01/25/2020   BMI 26.15 kg/m  CONSTITUTIONAL: Well-developed, well-nourished female in no acute distress.  HENT:  Normocephalic, atraumatic, External right and left ear normal. Oropharynx is clear and moist EYES: Conjunctivae and EOM are normal. Pupils are equal, round, and reactive to light. No scleral icterus.  NECK: Normal range of motion, supple, no masses.  Normal thyroid.  SKIN: Skin is warm and dry. No rash noted. Not diaphoretic. No erythema. No pallor. NEUROLGIC: Alert and oriented to person, place, and time. Normal reflexes, muscle tone coordination. No cranial nerve deficit noted. PSYCHIATRIC: Normal mood and affect. Normal behavior. Normal judgment and thought content. CARDIOVASCULAR: Normal heart rate noted, regular rhythm RESPIRATORY: Clear to auscultation bilaterally. Effort and breath sounds normal, no problems with respiration noted. BREASTS: Symmetric in size. No masses, skin changes, nipple drainage, or lymphadenopathy. ABDOMEN: Soft, normal bowel sounds, no distention noted.  No tenderness, rebound or guarding.  PELVIC: Normal appearing external genitalia; normal appearing vaginal mucosa and cervix.  No abnormal discharge noted.  Pap smear obtained.  Normal uterine size, no other palpable masses, no uterine or adnexal tenderness. MUSCULOSKELETAL: Normal range of motion. No tenderness.  No cyanosis, clubbing, or edema.  2+ distal pulses.      Nexplanon Removal Patient identified, informed consent performed, consent signed.   Appropriate time out taken. Nexplanon site identified.  Area prepped in usual sterile fashon. One ml of 1% lidocaine was used to anesthetize the area at the distal end of the implant. A small stab incision was made right beside the implant on the distal portion.  The Nexplanon rod was grasped using hemostats and removed  without difficulty.  There was minimal blood loss. There were no complications.  3 ml of 1% lidocaine was injected around the incision for post-procedure analgesia.  Steri-strips were applied over the small incision.  A pressure bandage was applied to reduce any bruising.  The patient tolerated the procedure well and was given post procedure instructions.  Patient is planning to use nothing for contraception/attempt conception.      Assessment:  Annual gynecologic examination with pap smear   Implanon removal STD esposure Plan:  Will follow up results of pap smear and manage accordingly. STD testing as per pt's request Wound care reviewed with pt. Flu vaccine today Routine preventative health maintenance measures emphasized. Please refer to After Visit Summary for other counseling recommendations.    01/27/2020, MD, FACOG Attending Obstetrician & Gynecologist Center for [facility], [place]

## 2020-01-30 NOTE — Patient Instructions (Signed)
Health Maintenance, Female Adopting a healthy lifestyle and getting preventive care are important in promoting health and wellness. Ask your health care provider about:  The right schedule for you to have regular tests and exams.  Things you can do on your own to prevent diseases and keep yourself healthy. What should I know about diet, weight, and exercise? Eat a healthy diet   Eat a diet that includes plenty of vegetables, fruits, low-fat dairy products, and lean protein.  Do not eat a lot of foods that are high in solid fats, added sugars, or sodium. Maintain a healthy weight Body mass index (BMI) is used to identify weight problems. It estimates body fat based on height and weight. Your health care provider can help determine your BMI and help you achieve or maintain a healthy weight. Get regular exercise Get regular exercise. This is one of the most important things you can do for your health. Most adults should:  Exercise for at least 150 minutes each week. The exercise should increase your heart rate and make you sweat (moderate-intensity exercise).  Do strengthening exercises at least twice a week. This is in addition to the moderate-intensity exercise.  Spend less time sitting. Even light physical activity can be beneficial. Watch cholesterol and blood lipids Have your blood tested for lipids and cholesterol at 22 years of age, then have this test every 5 years. Have your cholesterol levels checked more often if:  Your lipid or cholesterol levels are high.  You are older than 22 years of age.  You are at high risk for heart disease. What should I know about cancer screening? Depending on your health history and family history, you may need to have cancer screening at various ages. This may include screening for:  Breast cancer.  Cervical cancer.  Colorectal cancer.  Skin cancer.  Lung cancer. What should I know about heart disease, diabetes, and high blood  pressure? Blood pressure and heart disease  High blood pressure causes heart disease and increases the risk of stroke. This is more likely to develop in people who have high blood pressure readings, are of African descent, or are overweight.  Have your blood pressure checked: ? Every 3-5 years if you are 18-39 years of age. ? Every year if you are 40 years old or older. Diabetes Have regular diabetes screenings. This checks your fasting blood sugar level. Have the screening done:  Once every three years after age 40 if you are at a normal weight and have a low risk for diabetes.  More often and at a younger age if you are overweight or have a high risk for diabetes. What should I know about preventing infection? Hepatitis B If you have a higher risk for hepatitis B, you should be screened for this virus. Talk with your health care provider to find out if you are at risk for hepatitis B infection. Hepatitis C Testing is recommended for:  Everyone born from 1945 through 1965.  Anyone with known risk factors for hepatitis C. Sexually transmitted infections (STIs)  Get screened for STIs, including gonorrhea and chlamydia, if: ? You are sexually active and are younger than 22 years of age. ? You are older than 22 years of age and your health care provider tells you that you are at risk for this type of infection. ? Your sexual activity has changed since you were last screened, and you are at increased risk for chlamydia or gonorrhea. Ask your health care provider if   you are at risk.  Ask your health care provider about whether you are at high risk for HIV. Your health care provider may recommend a prescription medicine to help prevent HIV infection. If you choose to take medicine to prevent HIV, you should first get tested for HIV. You should then be tested every 3 months for as long as you are taking the medicine. Pregnancy  If you are about to stop having your period (premenopausal) and  you may become pregnant, seek counseling before you get pregnant.  Take 400 to 800 micrograms (mcg) of folic acid every day if you become pregnant.  Ask for birth control (contraception) if you want to prevent pregnancy. Osteoporosis and menopause Osteoporosis is a disease in which the bones lose minerals and strength with aging. This can result in bone fractures. If you are 65 years old or older, or if you are at risk for osteoporosis and fractures, ask your health care provider if you should:  Be screened for bone loss.  Take a calcium or vitamin D supplement to lower your risk of fractures.  Be given hormone replacement therapy (HRT) to treat symptoms of menopause. Follow these instructions at home: Lifestyle  Do not use any products that contain nicotine or tobacco, such as cigarettes, e-cigarettes, and chewing tobacco. If you need help quitting, ask your health care provider.  Do not use street drugs.  Do not share needles.  Ask your health care provider for help if you need support or information about quitting drugs. Alcohol use  Do not drink alcohol if: ? Your health care provider tells you not to drink. ? You are pregnant, may be pregnant, or are planning to become pregnant.  If you drink alcohol: ? Limit how much you use to 0-1 drink a day. ? Limit intake if you are breastfeeding.  Be aware of how much alcohol is in your drink. In the [place] one drink equals one 12 oz bottle of beer (355 mL), one 5 oz glass of wine (148 mL), or one 1 oz glass of hard liquor (44 mL). General instructions  Schedule regular health, dental, and eye exams.  Stay current with your vaccines.  Tell your health care provider if: ? You often feel depressed. ? You have ever been abused or do not feel safe at home. Summary  Adopting a healthy lifestyle and getting preventive care are important in promoting health and wellness.  Follow your health care provider's instructions about healthy  diet, exercising, and getting tested or screened for diseases.  Follow your health care provider's instructions on monitoring your cholesterol and blood pressure. This information is not intended to replace advice given to you by your health care provider. Make sure you discuss any questions you have with your health care provider. Document Revised: 04/20/2018 Document Reviewed: 04/20/2018 Elsevier Patient Education  2020 [employer]vier Inc[employer]  

## 2020-01-31 ENCOUNTER — Other Ambulatory Visit: Payer: Self-pay

## 2020-01-31 LAB — CERVICOVAGINAL ANCILLARY ONLY
Bacterial Vaginitis (gardnerella): NEGATIVE
Candida Glabrata: NEGATIVE
Candida Vaginitis: NEGATIVE
Chlamydia: NEGATIVE
Comment: NEGATIVE
Comment: NEGATIVE
Comment: NEGATIVE
Comment: NEGATIVE
Comment: NEGATIVE
Comment: NORMAL
Neisseria Gonorrhea: NEGATIVE
Trichomonas: POSITIVE — AB

## 2020-01-31 LAB — RPR: RPR Ser Ql: NONREACTIVE

## 2020-01-31 LAB — HEPATITIS C ANTIBODY: Hep C Virus Ab: <0.1 s/co ratio (ref 0.0–0.9)

## 2020-01-31 LAB — HIV ANTIBODY (ROUTINE TESTING W REFLEX): HIV Screen 4th Generation wRfx: NONREACTIVE

## 2020-01-31 LAB — HEPATITIS B SURFACE ANTIGEN: Hepatitis B Surface Ag: NEGATIVE

## 2020-01-31 MED ORDER — METRONIDAZOLE 500 MG PO TABS: 500.0000 mg | ORAL_TABLET | Freq: Two times a day (BID) | ORAL | 0 refills | Status: DC

## 2020-01-31 NOTE — Progress Notes (Signed)
Flagyl rx sent to pt's preferred pharmacy per Dr. Alysia Penna, pt made aware.

## 2020-02-01 LAB — CYTOLOGY - PAP: Diagnosis: NEGATIVE

## 2020-05-09 ENCOUNTER — Other Ambulatory Visit (HOSPITAL_COMMUNITY)
Admission: RE | Admit: 2020-05-09 | Discharge: 2020-05-09 | Disposition: A | Discharge status: Home / Self Care | Payer: Medicaid Other | Source: Ambulatory Visit | Attending: Obstetrics | Admitting: Obstetrics

## 2020-05-09 ENCOUNTER — Other Ambulatory Visit: Payer: Self-pay

## 2020-05-09 ENCOUNTER — Ambulatory Visit (INDEPENDENT_AMBULATORY_CARE_PROVIDER_SITE_OTHER): Payer: Medicaid Other

## 2020-05-09 DIAGNOSIS — A599 Trichomoniasis, unspecified: Secondary | ICD-10-CM | POA: Diagnosis not present

## 2020-05-09 DIAGNOSIS — N912 Amenorrhea, unspecified: Secondary | ICD-10-CM

## 2020-05-09 LAB — POCT URINE PREGNANCY: Preg Test, Ur: POSITIVE — AB

## 2020-05-09 NOTE — Progress Notes (Signed)
Ashley Sparks presents today for UPT. She has no unusual complaints and complains of Morning sickness. Marland Kitchen LMP:    OBJECTIVE: Appears well, in no apparent distress.  OB History    Gravida  1   Para  1   Term  1   Preterm  0   AB  0   Living  1     SAB  0   IAB  0   Ectopic  0   Multiple  0   Live Births  1          Home UPT Result:positive  In-Office UPT result:+Positive  I have reviewed the patient's medical, obstetrical, social, and family histories, and medications.   ASSESSMENT: Positive pregnancy test  PLAN Prenatal care to be completed at: [facility]

## 2020-05-11 NOTE — L&D Delivery Note (Signed)
OB/GYN Faculty Practice Delivery Note  Ashley Sparks is a 25 y.o. G2P1001 s/p NSVD at [redacted]w[redacted]d. She was admitted for active labor.   ROM: 0h 29m with moderate meconium GBS Status: Positive, inadequately treated due to precipitous delivery Maximum Maternal Temperature: pending  Labor Progress: Presented to [facility]U with contractions at 2 cm, progressed to 8cm, and then complete  Delivery Date/Time: 01/10/2021 at 2209 Delivery: Called to room and patient was complete and pushing. Head delivered direct OA. Nuchal cord present which was reduced and then shoulder delivered with suprapubic pressure and body delivered in usual fashion. Infant with spontaneous cry, placed on mother's abdomen, dried and stimulated. Cord clamped x 2 after 1-minute delay, and cut by Ashley Sparks. Cord blood drawn. Placenta delivered spontaneously with gentle cord traction. Fundus firm with massage and Pitocin. Labia, perineum, vagina, and cervix inspected and there were no lacerations  Placenta: Intact, 3V cord, sent to L&D Complications: Nuchal cord and few seconds of shoulder dystocia, relieved by reducing nuchal and suprapubic pressure Lacerations: None EBL: 500 cc, received Pitocin, TXA, and Methergine Analgesia: IV Fentanyl after delivery for checking for lacerations  Infant: Female  APGARs 8,9  weight pending  Ashley Mccreedy, MD, MPH OB Fellow, Faculty Practice Center for [facility], [facility] 01/10/2021, 10:51 PM

## 2020-05-13 LAB — CERVICOVAGINAL ANCILLARY ONLY
Bacterial Vaginitis (gardnerella): POSITIVE — AB
Candida Glabrata: NEGATIVE
Candida Vaginitis: NEGATIVE
Chlamydia: NEGATIVE
Comment: NEGATIVE
Comment: NEGATIVE
Comment: NEGATIVE
Comment: NEGATIVE
Comment: NEGATIVE
Comment: NORMAL
Neisseria Gonorrhea: NEGATIVE
Trichomonas: NEGATIVE

## 2020-05-19 ENCOUNTER — Other Ambulatory Visit: Payer: Self-pay

## 2020-05-19 ENCOUNTER — Inpatient Hospital Stay (HOSPITAL_COMMUNITY): Payer: Medicaid Other

## 2020-05-19 ENCOUNTER — Encounter (HOSPITAL_COMMUNITY): Payer: Self-pay | Admitting: Obstetrics and Gynecology

## 2020-05-19 ENCOUNTER — Inpatient Hospital Stay (HOSPITAL_COMMUNITY)
Admission: EM | Admit: 2020-05-19 | Discharge: 2020-05-19 | Disposition: A | Discharge status: Home / Self Care | Payer: Medicaid Other | Attending: Obstetrics and Gynecology | Admitting: Obstetrics and Gynecology

## 2020-05-19 DIAGNOSIS — O26891 Other specified pregnancy related conditions, first trimester: Secondary | ICD-10-CM | POA: Diagnosis not present

## 2020-05-19 DIAGNOSIS — R0602 Shortness of breath: Secondary | ICD-10-CM | POA: Insufficient documentation

## 2020-05-19 DIAGNOSIS — R079 Chest pain, unspecified: Secondary | ICD-10-CM | POA: Diagnosis present

## 2020-05-19 DIAGNOSIS — Z3490 Encounter for supervision of normal pregnancy, unspecified, unspecified trimester: Secondary | ICD-10-CM

## 2020-05-19 DIAGNOSIS — Z20822 Contact with and (suspected) exposure to covid-19: Secondary | ICD-10-CM | POA: Diagnosis not present

## 2020-05-19 DIAGNOSIS — Z3A01 Less than 8 weeks gestation of pregnancy: Secondary | ICD-10-CM | POA: Insufficient documentation

## 2020-05-19 DIAGNOSIS — R110 Nausea: Secondary | ICD-10-CM

## 2020-05-19 DIAGNOSIS — O210 Mild hyperemesis gravidarum: Secondary | ICD-10-CM | POA: Insufficient documentation

## 2020-05-19 DIAGNOSIS — O219 Vomiting of pregnancy, unspecified: Secondary | ICD-10-CM

## 2020-05-19 DIAGNOSIS — R102 Pelvic and perineal pain: Secondary | ICD-10-CM | POA: Insufficient documentation

## 2020-05-19 LAB — CBC WITH DIFFERENTIAL/PLATELET
Abs Immature Granulocytes: 0.03 10*3/uL (ref 0.00–0.07)
Basophils Absolute: 0.1 10*3/uL (ref 0.0–0.1)
Basophils Relative: 1 %
Eosinophils Absolute: 0.1 10*3/uL (ref 0.0–0.5)
Eosinophils Relative: 0 %
HCT: 43.4 % (ref 36.0–46.0)
Hemoglobin: 14.6 g/dL (ref 12.0–15.0)
Immature Granulocytes: 0 %
Lymphocytes Relative: 29 %
Lymphs Abs: 3.4 10*3/uL (ref 0.7–4.0)
MCH: 30.7 pg (ref 26.0–34.0)
MCHC: 33.6 g/dL (ref 30.0–36.0)
MCV: 91.4 fL (ref 80.0–100.0)
Monocytes Absolute: 0.8 10*3/uL (ref 0.1–1.0)
Monocytes Relative: 7 %
Neutro Abs: 7.3 10*3/uL (ref 1.7–7.7)
Neutrophils Relative %: 63 %
Platelets: 220 10*3/uL (ref 150–400)
RBC: 4.75 MIL/uL (ref 3.87–5.11)
RDW: 12 % (ref 11.5–15.5)
WBC: 11.6 10*3/uL — ABNORMAL HIGH (ref 4.0–10.5)
nRBC: 0 % (ref 0.0–0.2)

## 2020-05-19 LAB — COMPREHENSIVE METABOLIC PANEL
ALT: 19 U/L (ref 0–44)
AST: 22 U/L (ref 15–41)
Albumin: 4.2 g/dL (ref 3.5–5.0)
Alkaline Phosphatase: 52 U/L (ref 38–126)
Anion gap: 13 (ref 5–15)
BUN: 8 mg/dL (ref 6–20)
CO2: 19 mmol/L — ABNORMAL LOW (ref 22–32)
Calcium: 9.7 mg/dL (ref 8.9–10.3)
Chloride: 103 mmol/L (ref 98–111)
Creatinine, Ser: 0.6 mg/dL (ref 0.44–1.00)
GFR, Estimated: >60 mL/min (ref >60)
Glucose, Bld: 93 mg/dL (ref 70–99)
Potassium: 4.1 mmol/L (ref 3.5–5.1)
Sodium: 135 mmol/L (ref 135–145)
Total Bilirubin: 1.1 mg/dL (ref 0.3–1.2)
Total Protein: 7.4 g/dL (ref 6.5–8.1)

## 2020-05-19 LAB — URINALYSIS, ROUTINE W REFLEX MICROSCOPIC
Bilirubin Urine: NEGATIVE
Glucose, UA: NEGATIVE mg/dL
Ketones, ur: 20 mg/dL — AB
Leukocytes,Ua: NEGATIVE
Nitrite: NEGATIVE
Protein, ur: NEGATIVE mg/dL
Specific Gravity, Urine: 1.019 (ref 1.005–1.030)
pH: 6 (ref 5.0–8.0)

## 2020-05-19 LAB — RESP PANEL BY RT-PCR (FLU A&B, COVID) ARPGX2
Influenza A by PCR: NEGATIVE
Influenza B by PCR: NEGATIVE
SARS Coronavirus 2 by RT PCR: NEGATIVE

## 2020-05-19 LAB — FERRITIN: Ferritin: 20 ng/mL (ref 11–307)

## 2020-05-19 LAB — PROCALCITONIN: Procalcitonin: <0.1 ng/mL

## 2020-05-19 LAB — FIBRINOGEN: Fibrinogen: 337 mg/dL (ref 210–475)

## 2020-05-19 LAB — BRAIN NATRIURETIC PEPTIDE: B Natriuretic Peptide: 30.8 pg/mL (ref 0.0–100.0)

## 2020-05-19 LAB — TROPONIN I (HIGH SENSITIVITY): Troponin I (High Sensitivity): <2 ng/L (ref <18)

## 2020-05-19 LAB — D-DIMER, QUANTITATIVE: D-Dimer, Quant: 0.5 ug/mL-FEU (ref 0.00–0.50)

## 2020-05-19 LAB — C-REACTIVE PROTEIN: CRP: 0.5 mg/dL (ref <1.0)

## 2020-05-19 MED ORDER — COVID-19 MRNA VACCINE (PFIZER) 30 MCG/0.3ML IM SUSP: 0.3000 mL | Freq: Once | INTRAMUSCULAR | Status: DC

## 2020-05-19 MED ORDER — PROMETHAZINE HCL 25 MG PO TABS: 12.5000 mg | ORAL_TABLET | Freq: Four times a day (QID) | ORAL | 0 refills | Status: DC | PRN

## 2020-05-19 NOTE — MAU Note (Signed)
Ashley Sparks is a 25 y.o. at [redacted]w[redacted]d here in MAU reporting: had a recent covid exposure. Reporting lower abdominal pressure since last night and has been having nausea, headaches, body aches for the past 2 days. No vaginal bleeding, having some discharge.  LMP: 03/16/20 approximately  Onset of complaint: ongoing  Pain score: abdominal pain 7/10, body aches 5/10  Vitals:   05/19/20 1259 05/19/20 1334  BP: (!) 116/56 (!) 129/55  Pulse: (!) 101 66  Resp: 14 16  Temp: 98.2 F (36.8 C) 98.7 F (37.1 C)  SpO2: 100% 100%     Lab orders placed from triage: UA

## 2020-05-19 NOTE — ED Triage Notes (Signed)
Emergency Medicine Provider OB Triage Evaluation Note  Ashley Sparks is a 25 y.o. female, G1P1001, at Unknown gestation who presents to the emergency department with complaints of pelvic cramping and COVID-19 exposure.  Patient reports that she saw her OB/GYN a couple of weeks ago and she is approximately [redacted] weeks gestation.  She states that she has been having intermittent lower pelvic cramping and also states that she was recently exposed to somebody who was positive for COVID-19 and has since been having body aches, nausea, and chills.  Review of  Systems  Positive: Pelvic cramping, chills. Negative: Vaginal bleeding.  Physical Exam  BP (!) 116/56 (BP Location: Right Arm)   Pulse (!) 101   Temp 98.2 F (36.8 C) (Oral)   Resp 14   Ht 5\' 1"  (1.549 m)   Wt 68 kg   SpO2 100%   BMI 28.34 kg/m  General: Awake, no distress  HEENT: Atraumatic  Resp: Normal effort  Cardiac: Normal rate Abd: Nondistended, nontender  MSK: Moves all extremities without difficulty Neuro: Speech clear  Medical Decision Making  Pt evaluated for pregnancy concern and is stable for transfer to MAU. Pt is in agreement with plan for transfer.  1:09 PM Discussed with MAU APP, Heather, who accepts patient in transfer.  Clinical Impression  No diagnosis found.     , PA-C 05/19/20 1309

## 2020-05-19 NOTE — ED Notes (Signed)
Report called to Nicole in MAU 

## 2020-05-19 NOTE — MAU Provider Note (Signed)
History     CSN: [id number]  Arrival date and time: 05/19/20 1223   Event Date/Time   First Provider Initiated Contact with Patient 05/19/20 1411      Chief Complaint  Patient presents with  . Abdominal Pain  . Covid Exposure   Unique Sillas is a 25 y.o. G2P1001 at [redacted]w[redacted]d who presents today with chest pain and shortness of breath. She had covid exposure last week. The last night she had abdominal pain that worsened to today being chest pain and shortness of breath. She has not had covid vaccine. She denies any vaginal bleeding. She is having cramping at this time as well. She has an appt on 05/30/2020 to start prenatal care. No ultrasound yet in this pregnancy. Denies hx of asthma, but reports that last year she was given an inhaler for her allergies. She has been using this inhaler over the last few days.   Pelvic Pain The patient's primary symptoms include pelvic pain. This is a new problem. The current episode started yesterday. The problem occurs intermittently. The problem has been unchanged. The problem affects both sides. She is pregnant. The vaginal discharge was normal. There has been no bleeding. Nothing aggravates the symptoms. She has tried acetaminophen for the symptoms. The treatment provided moderate relief.    OB History    Gravida  2   Para  1   Term  1   Preterm  0   AB  0   Living  1     SAB  0   IAB  0   Ectopic  0   Multiple  0   Live Births  1           Past Medical History:  Diagnosis Date  . Constipation   . Depression   . Gluten intolerance   . Hypothyroid   . Keloid   . Lactose intolerance   . PAC (premature atrial contraction) 08/24/2016  . Palpitations 07/01/2016  . PVC (premature ventricular contraction) 08/24/2016  . Seasonal allergies   . Shortness of breath 07/01/2016   typically during allergy season  . UTI (urinary tract infection)     History reviewed. No pertinent surgical history.  Family History  Adopted: Yes   Problem Relation Age of Onset  . Diabetes Mother   . Seizures Mother   . Hyperthyroidism Maternal Aunt   . Celiac disease Neg Hx   . Cholelithiasis Neg Hx   . Ulcers Neg Hx     Social History   Tobacco Use  . Smoking status: Never Smoker  . Smokeless tobacco: Never Used  Vaping Use  . Vaping Use: Never used  Substance Use Topics  . Alcohol use: No  . Drug use: Not Currently    Types: Marijuana    Allergies:  Allergies  Allergen Reactions  . Lactose Intolerance (Gi) Diarrhea and Other (See Comments)    Gi upset   . Gluten Meal Hives, Diarrhea and Other (See Comments)    And abdominal pain  . Macrobid [Nitrofurantoin Monohyd Macro] Hives and Rash    Medications Prior to Admission  Medication Sig Dispense Refill Last Dose  . metroNIDAZOLE (FLAGYL) 500 MG tablet Take 1 tablet (500 mg total) by mouth 2 (two) times daily. 14 tablet 0     Review of Systems  Genitourinary: Positive for pelvic pain.   Physical Exam   Blood pressure (!) 113/58, pulse 80, temperature 98.5 F (36.9 C), temperature source Oral, resp. rate 18, height 5\' 1"  (  1.549 m), weight 64.1 kg, last menstrual period 03/16/2020, SpO2 100 %, currently breastfeeding.  Physical Exam Vitals and nursing note reviewed. Exam conducted with a chaperone present.  Constitutional:      General: She is in acute distress.  HENT:     Head: Normocephalic.  Eyes:     Pupils: Pupils are equal, round, and reactive to light.  Cardiovascular:     Rate and Rhythm: Tachycardia present.  Pulmonary:     Effort: Tachypnea and accessory muscle usage present.     Breath sounds: Normal breath sounds.  Abdominal:     Palpations: Abdomen is soft.  Skin:    General: Skin is warm and dry.  Neurological:     Mental Status: She is alert and oriented to person, place, and time.  Psychiatric:        Mood and Affect: Mood normal.        Behavior: Behavior normal.    Results for orders placed or performed during the hospital  encounter of 05/19/20 (from the past 24 hour(s))  Resp Panel by RT-PCR (Flu A&B, Covid) Nasopharyngeal Swab     Status: None   Collection Time: 05/19/20  1:58 PM   Specimen: Nasopharyngeal Swab; Nasopharyngeal(NP) swabs in vial transport medium  Result Value Ref Range   SARS Coronavirus 2 by RT PCR NEGATIVE NEGATIVE   Influenza A by PCR NEGATIVE NEGATIVE   Influenza B by PCR NEGATIVE NEGATIVE  Urinalysis, Routine w reflex microscopic Nasopharyngeal Swab     Status: Abnormal   Collection Time: 05/19/20  1:58 PM  Result Value Ref Range   Color, Urine YELLOW YELLOW   APPearance HAZY (A) CLEAR   Specific Gravity, Urine 1.019 1.005 - 1.030   pH 6.0 5.0 - 8.0   Glucose, UA NEGATIVE NEGATIVE mg/dL   Hgb urine dipstick SMALL (A) NEGATIVE   Bilirubin Urine NEGATIVE NEGATIVE   Ketones, ur 20 (A) NEGATIVE mg/dL   Protein, ur NEGATIVE NEGATIVE mg/dL   Nitrite NEGATIVE NEGATIVE   Leukocytes,Ua NEGATIVE NEGATIVE   RBC / HPF 6-10 0 - 5 RBC/hpf   WBC, UA 0-5 0 - 5 WBC/hpf   Bacteria, UA RARE (A) NONE SEEN   Squamous Epithelial / LPF 11-20 0 - 5   Mucus PRESENT   CBC with Differential/Platelet     Status: Abnormal   Collection Time: 05/19/20  2:18 PM  Result Value Ref Range   WBC 11.6 (H) 4.0 - 10.5 K/uL   RBC 4.75 3.87 - 5.11 MIL/uL   Hemoglobin 14.6 12.0 - 15.0 g/dL   HCT 16.143.4 09.636.0 - 04.546.0 %   MCV 91.4 80.0 - 100.0 fL   MCH 30.7 26.0 - 34.0 pg   MCHC 33.6 30.0 - 36.0 g/dL   RDW 40.912.0 81.111.5 - 91.415.5 %   Platelets 220 150 - 400 K/uL   nRBC 0.0 0.0 - 0.2 %   Neutrophils Relative % 63 %   Neutro Abs 7.3 1.7 - 7.7 K/uL   Lymphocytes Relative 29 %   Lymphs Abs 3.4 0.7 - 4.0 K/uL   Monocytes Relative 7 %   Monocytes Absolute 0.8 0.1 - 1.0 K/uL   Eosinophils Relative 0 %   Eosinophils Absolute 0.1 0.0 - 0.5 K/uL   Basophils Relative 1 %   Basophils Absolute 0.1 0.0 - 0.1 K/uL   Immature Granulocytes 0 %   Abs Immature Granulocytes 0.03 0.00 - 0.07 K/uL  Brain natriuretic peptide     Status:  None   Collection  Time: 05/19/20  2:18 PM  Result Value Ref Range   B Natriuretic Peptide 30.8 0.0 - 100.0 pg/mL  Comprehensive metabolic panel     Status: Abnormal   Collection Time: 05/19/20  2:18 PM  Result Value Ref Range   Sodium 135 135 - 145 mmol/L   Potassium 4.1 3.5 - 5.1 mmol/L   Chloride 103 98 - 111 mmol/L   CO2 19 (L) 22 - 32 mmol/L   Glucose, Bld 93 70 - 99 mg/dL   BUN 8 6 - 20 mg/dL   Creatinine, Ser 4.26 0.44 - 1.00 mg/dL   Calcium 9.7 8.9 - 83.4 mg/dL   Total Protein 7.4 6.5 - 8.1 g/dL   Albumin 4.2 3.5 - 5.0 g/dL   AST 22 15 - 41 U/L   ALT 19 0 - 44 U/L   Alkaline Phosphatase 52 38 - 126 U/L   Total Bilirubin 1.1 0.3 - 1.2 mg/dL   GFR, Estimated >19 >62 mL/min   Anion gap 13 5 - 15  Troponin I (High Sensitivity)     Status: None   Collection Time: 05/19/20  2:18 PM  Result Value Ref Range   Troponin I (High Sensitivity) <2 <18 ng/L  C-reactive protein     Status: None   Collection Time: 05/19/20  2:18 PM  Result Value Ref Range   CRP 0.5 <1.0 mg/dL  D-dimer, quantitative (not at [facility])     Status: None   Collection Time: 05/19/20  2:18 PM  Result Value Ref Range   D-Dimer, Quant 0.50 0.00 - 0.50 ug/mL-FEU  Fibrinogen     Status: None   Collection Time: 05/19/20  2:18 PM  Result Value Ref Range   Fibrinogen 337 210 - 475 mg/dL  Ferritin     Status: None   Collection Time: 05/19/20  2:18 PM  Result Value Ref Range   Ferritin 20 11 - 307 ng/mL  Procalcitonin     Status: None   Collection Time: 05/19/20  2:18 PM  Result Value Ref Range   Procalcitonin <0.10 ng/mL   DG Chest 1 View  Result Date: 05/19/2020 CLINICAL DATA:  COVID exposure, shortness of breath, [redacted] weeks pregnant EXAM: CHEST  1 VIEW COMPARISON:  07/31/2019 FINDINGS: The heart size and mediastinal contours are within normal limits. Both lungs are clear. The visualized skeletal structures are unremarkable. IMPRESSION: No active disease. Electronically Signed   By: Judie Petit.  Shick M.D.   On: 05/19/2020  14:47   US OB LESS THAN 14 WEEKS WITH OB TRANSVAGINAL  Result Date: 05/19/2020 CLINICAL DATA:  Pelvic pain.  First trimester pregnancy. EXAM: OBSTETRIC <14 WK [place] AND TRANSVAGINAL OB US TECHNIQUE: Both transabdominal and transvaginal ultrasound examinations were performed for complete evaluation of the gestation as well as the maternal uterus, adnexal regions, and pelvic cul-de-sac. Transvaginal technique was performed to assess early pregnancy. COMPARISON:  None. FINDINGS: Intrauterine gestational sac: Single Yolk sac:  Visualized. Embryo:  Visualized. Cardiac Activity: Visualized. Heart Rate: 107 bpm CRL:  3 mm   5 w   5 d                  [place] EDC: 01/14/2021 Subchorionic hemorrhage:  Small subchorionic hemorrhage noted. Maternal uterus/adnexae: Both ovaries are normal in appearance. No mass or abnormal free fluid identified. IMPRESSION: Single living IUP with estimated gestational age of [redacted] weeks 5 days, and [place] EDC of 01/14/2021. Small subchorionic hemorrhage noted. Electronically Signed   By: Dietrich Pates.D.  On: 05/19/2020 16:49    MAU Course  Procedures  MDM Patient on arrival had O2 sat down to 80 with spontaneous resolution. She then maintained O2 sat at 100 for the majority of her time here.   However patient states that she still feels SOB. At this time we reviewed with her that her covid test was negative. She then became much less anxious. Her breathing slowed down and she did not have any further complaints of SOB.   DW patient that it seems she is very concerned about covid exposure, but she has not had a covid vaccine. She states that she wants to get the vaccine and offered covid vaccine here today. She would like to get this done.   Spoke with pharmacy. They state that only inpatient patients can get the vaccine and that we can't give it in the ED/MAU at this time. She checked to see if there were any available that needed to be use right away and there didn't seem to be any that  were about to expire. So patient will need to get vaccinated a community vaccine clinic.   Assessment and Plan   1. Contact with and (suspected) exposure to covid-19   2. Pelvic pain affecting pregnancy in first trimester, antepartum   3. Intrauterine pregnancy   4. [redacted] weeks gestation of pregnancy   5. Nausea/vomiting in pregnancy    DC home Comfort measures reviewed  1stTrimester precautions  Bleeding precautions RX: phenergan PRN #[id number]  Return to MAU as needed FU with OB as planned   Follow-up Information    [facility] Follow up.   Contact information: [address] [place] [address] [phone]             Thressa Sheller DNP, CNM  05/19/20  5:39 PM

## 2020-05-19 NOTE — ED Notes (Signed)
PA at triage for MAU screening

## 2020-05-19 NOTE — Discharge Instructions (Addendum)
COVID-19 Vaccination if You Are Pregnant or Breastfeeding ° °The Society for Maternal-Fetal Medicine (SMFM) and other pregnancy experts recommend that pregnant and lactating people be vaccinated against COVID-19. The Centers for Disease Control and Prevention (CDC) also recommend vaccination for “all people aged 23 years and older, including people who are pregnant, breastfeeding, trying to get pregnant now, or might become pregnant in the future.” Vaccination is the best way to reduce the risks of COVID-19 infection and COVID-related complications for both you and your baby. ° °Three vaccines are available to prevent COVID-19: °• The two-dose Pfizer vaccine for people 12 years and older--APPROVED by the [place] Food and Drug Administration on January 01, 2020 °• The two-dose Moderna vaccine for people 18 years and older--AUTHORIZED for emergency use °• The one-dose Wenig & Henrichs vaccine for people 18 years and older (you may also see this vaccine referred to as the “Janssen vaccine”)--AUTHORIZED for emergency use ° °For those receiving the Pfizer and Moderna vaccines, the second dose is given 21 days (Pfizer) and 28 days (Moderna) after the first dose. The [place] vaccine is only one dose. ° °Information for Pregnant Individuals °If you are pregnant or planning to become pregnant and are thinking about getting vaccinated, consider talking with your health care professional about the vaccine.  ° °To help with your decision, you should consider the following key points: °Anyone can get the COVID vaccines free of charge regardless of immigration status or whether they have insurance. You may be asked for your social security number, but it is  °NOT required to get vaccinated. ° °What are benefits of getting the COVID-19 vaccines during pregnancy?  °• The vaccines can help protect you from getting COVID-19. With the two-dose vaccines, you must get both doses for maximum effectiveness. It’s not yet known how  long protection lasts. ° °• Another potential benefit is that getting the vaccine while pregnant may help you pass antiCOVID-19 antibodies to your baby. In numerous studies of vaccinated moms, antibodies were found in the umbilical cord blood of babies and in the mother’s breastmilk. ° °• The CDC, along with other federal partners, are monitoring people who have been vaccinated for serious side effects. So far, more than 139,000 pregnant people have been °vaccinated. No unexpected pregnancy or fetal problems have occurred. There have been no reports of any increased risk of pregnancy loss, growth problems, or birth defects. ° °• A safe vaccine is generally considered one in which the benefits of being vaccinated outweigh the risks. The current vaccines are not live vaccines. There is only a very small  °chance that they cross the placenta, so it’s unlikely that they even reach the fetus. Vaccines don’t affect future fertility. The only people who should NOT get vaccinated are those who have had a severe allergic reaction to vaccines in the past or any vaccine ingredients. ° °• Side effects may occur in the first 3 days after getting vaccinated.1 These include mild to moderate fever, headache, and muscle aches. Side effects may be worse after the second dose of the Pfizer and Moderna vaccines. Fever should be avoided during pregnancy,especially in the first trimester. Those who develop a fever after vaccination can take °acetaminophen (Tylenol). This medication is safe to use during pregnancy and does not  affect how the vaccine works.  ° °What are the known risks of getting COVID-19 during pregnancy?  °About 1 to 3 per 1,000 pregnant women with COVID-19 will develop severe disease. Compared with those who   aren’t pregnant, pregnant people infected by the COVID-19 virus: °• Are 3 times more likely to need ICU care °• Are 2 to 3 times more likely to need advanced life support and a breathing tube  °• Have a small  increased risk of dying due to COVID-19 °They may also be at increased risk of stillbirth and preterm birth. ° °What is my risk of getting COVID-19?  °Your risk of getting COVID-19 depends on the chance that you will come into contact with another infected person. The risk may be higher if you live in a community where there is a lot of COVID-19 infection or work in healthcare or another high-contact setting.  ° °What is my risk for severe complications if I get COVID-19?  °Data show that older pregnant women; those with preexisting health conditions, such as a body mass index higher than 35 kg/m2, diabetes, and heart disorders; and Black or Latinx women have an especially increased risk of severe disease and death from COVID-19. °  °If you still have questions about the vaccines or need more information, ask your health care provider or go to the Centers for Disease Control and Prevention’s COVID-19 vaccine webpage.  ° °An Update on the Johnson & Johnson Vaccine  ° °In April 2021, the FDA and CDC called for a brief pause to use of the Johnson & Johnson vaccine. They did so after reports of a severe side effect in a very small number of women younger than age 50 following vaccination. This side effect, called thrombosis with thrombocytopenia syndrome (TTS), causes blood clots (thrombosis) combined with low levels of platelets (thrombocytopenia). ° °TTS following the Johnson & Johnson vaccine is extremely rare. At the time of this update, it has occurred in only 7 people per 1 million Johnson & Johnson shots given. According to the CDC, being on hormonal birth control (the pill, patch, or ring), pregnancy, breastfeeding, or being recently pregnant does not make you more likely to develop TTS after getting the Johnson & Johnson vaccine. The pause was lifted on September 01, 2019, after the FDA and CDC determined that the known benefits of the Johnson & Johnson vaccine far outweigh the risks.  ° °Health care professionals  have been alerted to the possibility of this side effect in people who have received the Johnson & Johnson vaccine. National organizations continue to recommend COVID-19 vaccination with any of the vaccines for pregnant women. All women younger than age 50 years, whether pregnant, breastfeeding, or not, should be aware of the very rare risk of TTS after getting the Johnson & Johnson vaccine. The Pfizer and Moderna vaccines don’t have this risk. If you get the Johnson & Johnson vaccine,  °seek medical help right away if you develop any of the following symptoms within 3 weeks of getting your shot: ° °• Severe or persistent headaches or blurred vision °• Shortness of breath °• Chest pain °• Leg swelling °• Persistent abdominal pain °• Easy bruising or tiny blood spots under the skin beyond the injection site ° °Experts continue to collect health and safety information from pregnant people who have been vaccinated. If you have questions about vaccination during pregnancy, visit the CDC website or talk to your health care professional. Information for Breastfeeding/Lactating Individuals The Society for Maternal-Fetal Medicine and other pregnancy experts recommend COVID-19 vaccination for people who are breastfeeding/lactating. You don’t have to delay or stop  °breastfeeding just because you get vaccinated.  ° °Getting Vaccinated  °You can get vaccinated at   any time during pregnancy. The CDC is committed to monitoring the vaccines safety for all individuals. Your health professional or vaccine clinic may give you information about enrolling in the v-safe after vaccination health checker (see the box below).Even after youre fully vaccinated, it is important to follow the CDCs guidance for wearing a mask indoors in areas where there are substantial or high rates of COVID-19 infection.   What Happens When You Enroll in v-Safe?  The v-safe after vaccination health checker program lets the CDC check in with you after  your vaccination. At sign-up, you can indicate that you are pregnant. Once you do that, expect the following:  Someone may call you from the v-safe program to ask initial questions and get more information.  You may be asked to enroll in the vaccine pregnancy registry, which is collecting information about any effects of the vaccine during pregnancy. This is a great way to help scientists monitor the vaccines safety and effectiveness.   References 1. [address] [place] Lo M, Wallace M, Curran KG, Chamberland M, et al. The Advisory Committee on [employer] Interim Recommendation for Use of Pfizer-BioNTech  COVID-19 Vaccine -- [place], December 2020. MMWR Morbidity and Mortality Weekly Report 2020;69. 2. FDA Briefing Document. Janssen Ad26.COV2.S Vaccine for the Prevention of COVID-19. 2021. Accessed Jul 14, 2019; Available from: FemaleHumor.es 3. PFIZER-BIONTECH COVID-19 VACCINE [package insert] [place]: Pfizer and [place], [place]: Biontech;2020. 4. FDA Briefing Document. Moderna COVID-19 Vaccine. 2020. Accessed 2020, Dec 18; Available from: DateTunes.nl  5. Bea Laura, Bordt EA, Atyeo C, Deriso E, Akinwunmi B, Young N, et al. COVID-19 vaccine response in pregnant and lactating women: a cohort study. Am J Obstet Gynecol 2021 Mar 24. 6. Panagiotakopoulos Elbert Ewings, Omer Jack, Lipkind HS, Caprice Renshaw DS, et al. SARS-CoV-2 Infection Among Hospitalized Pregnant Women: Reasons for Admission and Pregnancy  Characteristics - [facility], March 1-Oct 08, 2018. MMWR Morb Baxter Flattery Rep 2020 Sep 23;69(38):1355-9. 7. Zambrano LD, [place], Strid P, [place], Baldo Ash [place], et al. Update: Characteristics of Symptomatic Women of Reproductive Age with Laboratory-Confirmed SARSCoV-2 Infection by Pregnancy Status - [place] States, January 22-February 11, 2019. MMWR Morb Baxter Flattery Rep 2020 Nov  6;69(44):1641-7. 8. Delahoy MJ, [place] Duff, Weston Settle PD, Blima Ledger, et al. Characteristics and Maternal and Birth Outcomes of Hospitalized Pregnant Women with Laboratory-Confirmed COVID-19 - COVID-NET, 49 States, March 1-December 31, 2018. MMWR Morb Baxter Flattery Rep 2020 Sep 25;69(38):1347-54.  Safe Medications in Pregnancy   Acne: Benzoyl Peroxide Salicylic Acid  Backache/Headache: Tylenol: 2 regular strength every 4 hours OR              2 Extra strength every 6 hours  Colds/Coughs/Allergies: Benadryl (alcohol free) 25 mg every 6 hours as needed Breath right strips Claritin Cepacol throat lozenges Chloraseptic throat spray Cold-Eeze- up to three times per day Cough drops, alcohol free Flonase (by prescription only) Guaifenesin Mucinex Robitussin DM (plain only, alcohol free) Saline nasal spray/drops Sudafed (pseudoephedrine) & Actifed ** use only after [redacted] weeks gestation and if you do not have high blood pressure Tylenol Vicks Vaporub Zinc lozenges Zyrtec   Constipation: Colace Ducolax suppositories Fleet enema Glycerin suppositories Metamucil Milk of magnesia Miralax Senokot Smooth move tea  Diarrhea: Kaopectate Imodium A-D  *NO pepto Bismol  Hemorrhoids: Anusol Anusol HC Preparation H Tucks  Indigestion: Tums Maalox Mylanta Zantac  Pepcid  Insomnia: Benadryl (alcohol free) 25mg  every 6 hours as needed  Tylenol PM Unisom, no Gelcaps  Leg Cramps: Tums MagGel  Nausea/Vomiting:  Bonine Dramamine Emetrol Ginger extract Sea bands Meclizine  Nausea medication to take during pregnancy:  Unisom (doxylamine succinate 25 mg tablets) Take one tablet daily at bedtime. If symptoms are not adequately controlled, the dose can be increased to a maximum recommended dose of two tablets daily (1/2 tablet in the morning, 1/2 tablet mid-afternoon and one at bedtime). Vitamin B6 100mg  tablets. Take one tablet twice a day (up to 200 mg per  day).  Skin Rashes: Aveeno products Benadryl cream or 25mg  every 6 hours as needed Calamine Lotion 1% cortisone cream  Yeast infection: Gyne-lotrimin 7 Monistat 7   **If taking multiple medications, please check labels to avoid duplicating the same active ingredients **take medication as directed on the label ** Do not exceed 4000 mg of tylenol in 24 hours **Do not take medications that contain aspirin or ibuprofen

## 2020-06-06 ENCOUNTER — Encounter: Payer: Self-pay | Admitting: Obstetrics and Gynecology

## 2020-06-06 ENCOUNTER — Ambulatory Visit (INDEPENDENT_AMBULATORY_CARE_PROVIDER_SITE_OTHER): Payer: Medicaid Other | Admitting: Obstetrics and Gynecology

## 2020-06-06 ENCOUNTER — Other Ambulatory Visit: Payer: Self-pay

## 2020-06-06 VITALS — Wt 141.0 lb

## 2020-06-06 DIAGNOSIS — Z3481 Encounter for supervision of other normal pregnancy, first trimester: Secondary | ICD-10-CM | POA: Diagnosis not present

## 2020-06-06 DIAGNOSIS — N760 Acute vaginitis: Secondary | ICD-10-CM

## 2020-06-06 DIAGNOSIS — Z3A08 8 weeks gestation of pregnancy: Secondary | ICD-10-CM | POA: Diagnosis not present

## 2020-06-06 DIAGNOSIS — B9689 Other specified bacterial agents as the cause of diseases classified elsewhere: Secondary | ICD-10-CM

## 2020-06-06 DIAGNOSIS — Z3490 Encounter for supervision of normal pregnancy, unspecified, unspecified trimester: Secondary | ICD-10-CM

## 2020-06-06 MED ORDER — METRONIDAZOLE 500 MG PO TABS: 500.0000 mg | ORAL_TABLET | Freq: Two times a day (BID) | ORAL | 0 refills | Status: AC

## 2020-06-06 NOTE — Progress Notes (Signed)
History:   Ashley Sparks is a 25 y.o. G2P1001 at [redacted]w[redacted]d by early ultrasound being seen today for her first obstetrical visit.  Her obstetrical history is significant for anemia, subclinical hyperthyroidism. Patient does intend to breast feed. Pregnancy history fully reviewed.  Patient reports N/V   HISTORY: OB History  Gravida Para Term Preterm AB Living  2 1 1  0 0 1  SAB IAB Ectopic Multiple Live Births  0 0 0 0 1    # Outcome Date GA Lbr Len/2nd Weight Sex Delivery Anes PTL Lv  2 Current           1 Term 01/12/17 [redacted]w[redacted]d 19:46 / 00:26 7 lb 12.9 oz (3.541 kg) F Vag-Spont EPI  LIV     Birth Comments: wnl     Name: [name],[name]     Apgar1: 9  Apgar5: 9    Last pap smear was done 01/2020 and was normal  Past Medical History:  Diagnosis Date  . Constipation   . Depression   . Gluten intolerance   . Hypothyroid   . Keloid   . Lactose intolerance   . PAC (premature atrial contraction) 08/24/2016  . Palpitations 07/01/2016  . PVC (premature ventricular contraction) 08/24/2016  . Seasonal allergies   . Shortness of breath 07/01/2016   typically during allergy season  . UTI (urinary tract infection)    History reviewed. No pertinent surgical history. Family History  Adopted: Yes  Problem Relation Age of Onset  . Diabetes Mother   . Seizures Mother   . Hyperthyroidism Maternal Aunt   . Celiac disease Neg Hx   . Cholelithiasis Neg Hx   . Ulcers Neg Hx    Social History   Tobacco Use  . Smoking status: Never Smoker  . Smokeless tobacco: Never Used  Vaping Use  . Vaping Use: Never used  Substance Use Topics  . Alcohol use: No  . Drug use: Not Currently    Types: Marijuana   Allergies  Allergen Reactions  . Lactose Intolerance (Gi) Diarrhea and Other (See Comments)    Gi upset   . Gluten Meal Hives, Diarrhea and Other (See Comments)    And abdominal pain  . Macrobid [Nitrofurantoin Monohyd Macro] Hives and Rash   Current Outpatient Medications on File Prior  to Visit  Medication Sig Dispense Refill  . promethazine (PHENERGAN) 25 MG tablet Take 0.5-1 tablets (12.5-25 mg total) by mouth every 6 (six) hours as needed for nausea or vomiting. 30 tablet 0   No current facility-administered medications on file prior to visit.    Review of Systems Pertinent items noted in HPI and remainder of comprehensive ROS otherwise negative.  Physical Exam:   Vitals:   06/06/20 1033  Weight: 141 lb (64 kg)     Bedside Ultrasound for FHR check: Viable intrauterine pregnancy with positive cardiac activity noted Patient informed that the ultrasound is considered a limited obstetric ultrasound and is not intended to be a complete ultrasound exam.  Patient also informed that the ultrasound is not being completed with the intent of assessing for fetal or placental anomalies or any pelvic abnormalities.  Explained that the purpose of today's ultrasound is to assess for fetal heart rate.  Patient acknowledges the purpose of the exam and the limitations of the study. General: well-developed, well-nourished female in no acute distress  Breasts:  normal appearance, no masses or tenderness bilaterally  Skin: normal coloration and turgor, no rashes  Neurologic: oriented, normal, negative, normal mood  Extremities: normal strength, tone, and muscle mass, ROM of all joints is normal  HEENT PERRLA, extraocular movement intact and sclera clear, anicteric  Neck supple and no masses  Cardiovascular: regular rate and rhythm  Respiratory:  no respiratory distress, normal breath sounds  Abdomen: soft, non-tender; bowel sounds normal; no masses,  no organomegaly  Pelvic: Deferred     Assessment:    Pregnancy: G2P1001 Patient Active Problem List   Diagnosis Date Noted  . Supervision of normal pregnancy, antepartum 06/06/2020  . B12 deficiency 01/27/2017  . Anemia complicating pregnancy in third trimester 11/16/2016  . Iron deficiency anemia secondary to blood loss (chronic)  10/28/2016  . PAC (premature atrial contraction) 08/24/2016  . PVC (premature ventricular contraction) 08/24/2016  . 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 Encounter for supervision of normal pregnancy, antepartum, unspecified gravidity  - CBC/D/Plt+RPR+Rh+ABO+Rub Ab... - Culture, OB Urine - Thyroid Panel With TSH  2. Bacterial vaginosis  Was positive at the end of Dec and was not treated. Continues to have odor. Rx: Flagyl    Initial labs drawn. Continue prenatal vitamins. Problem list reviewed and updated. Genetic Screening discussed, NIPS: requested. Ultrasound discussed; fetal anatomic survey: requested. Anticipatory guidance about prenatal visits given including labs, ultrasounds, and testing. Discussed usage of Bab[employer]s and virtual visits as additional source of managing and completing prenatal visits in midst of coronavirus and pandemic.   Encouraged to complete MyChart Registration for her ability to review results, send requests, and have questions addressed.  The nature of [facility] with multiple MDs and Advanced Practice Providers was explained to patient; also emphasized that residents, students are part of our team. Routine obstetric precautions reviewed. Encouraged to seek out care at office or emergency room [facility] preferred) for urgent and/or emergent concerns. Return in about 2 weeks (around 06/20/2020) for 2 weeks for genetic screen blood work, 4 weeks for return OB with app.     Rasch, Harolyn Rutherford, NP [facility] [employer], [facility]

## 2020-06-06 NOTE — Progress Notes (Signed)
NOB 8wks 2d   U/S on 05/19/2020 pt was 5wks5d EDD now 01/14/21. Pt made aware due to change in dates labs will not be done today.  cervical swab on 05/09/20 + BV   Pt has had first COVID Dose. Flu vaccine: received per pt 03/2020  Last pap: 01/30/20 WNL  Genetic Screening: will have to be scheduled for another day.  CC: BV Rx was never sent from hospital visit. Pt wants to discuss U/S done on 05/19/20. *Per pt had underactive Thyroid .  B/P cuff was not working .

## 2020-06-07 LAB — HCV INTERPRETATION

## 2020-06-07 LAB — CBC/D/PLT+RPR+RH+ABO+RUB AB...
Antibody Screen: NEGATIVE
Basophils Absolute: 0 10*3/uL (ref 0.0–0.2)
Basos: 0 %
EOS (ABSOLUTE): 0.1 10*3/uL (ref 0.0–0.4)
Eos: 1 %
HCV Ab: <0.1 s/co ratio (ref 0.0–0.9)
HIV Screen 4th Generation wRfx: NONREACTIVE
Hematocrit: 38.8 % (ref 34.0–46.6)
Hemoglobin: 13.1 g/dL (ref 11.1–15.9)
Hepatitis B Surface Ag: NEGATIVE
Immature Grans (Abs): 0 10*3/uL (ref 0.0–0.1)
Immature Granulocytes: 0 %
Lymphocytes Absolute: 2.2 10*3/uL (ref 0.7–3.1)
Lymphs: 21 %
MCH: 31.1 pg (ref 26.6–33.0)
MCHC: 33.8 g/dL (ref 31.5–35.7)
MCV: 92 fL (ref 79–97)
Monocytes Absolute: 0.6 10*3/uL (ref 0.1–0.9)
Monocytes: 5 %
Neutrophils Absolute: 7.4 10*3/uL — ABNORMAL HIGH (ref 1.4–7.0)
Neutrophils: 73 %
Platelets: 208 10*3/uL (ref 150–450)
RBC: 4.21 x10E6/uL (ref 3.77–5.28)
RDW: 11.8 % (ref 11.7–15.4)
RPR Ser Ql: NONREACTIVE
Rh Factor: POSITIVE
Rubella Antibodies, IGG: 3.54 index (ref >0.99)
WBC: 10.3 10*3/uL (ref 3.4–10.8)

## 2020-06-07 LAB — THYROID PANEL WITH TSH
Free Thyroxine Index: 2.2 (ref 1.2–4.9)
T3 Uptake Ratio: 24 % (ref 24–39)
T4, Total: 9.2 ug/dL (ref 4.5–12.0)
TSH: 0.015 u[IU]/mL — ABNORMAL LOW (ref 0.450–4.500)

## 2020-06-08 LAB — CULTURE, OB URINE

## 2020-06-08 LAB — URINE CULTURE, OB REFLEX

## 2020-06-20 ENCOUNTER — Other Ambulatory Visit: Payer: Medicaid Other

## 2020-06-20 ENCOUNTER — Other Ambulatory Visit: Payer: Self-pay

## 2020-06-20 DIAGNOSIS — Z3490 Encounter for supervision of normal pregnancy, unspecified, unspecified trimester: Secondary | ICD-10-CM

## 2020-06-21 LAB — CBC/D/PLT+RPR+RH+ABO+RUB AB...
Antibody Screen: NEGATIVE
Basophils Absolute: 0 10*3/uL (ref 0.0–0.2)
Basos: 0 %
EOS (ABSOLUTE): 0.1 10*3/uL (ref 0.0–0.4)
Eos: 1 %
HCV Ab: <0.1 s/co ratio (ref 0.0–0.9)
HIV Screen 4th Generation wRfx: NONREACTIVE
Hematocrit: 39.3 % (ref 34.0–46.6)
Hemoglobin: 13.7 g/dL (ref 11.1–15.9)
Hepatitis B Surface Ag: NEGATIVE
Immature Grans (Abs): 0 10*3/uL (ref 0.0–0.1)
Immature Granulocytes: 0 %
Lymphocytes Absolute: 1.5 10*3/uL (ref 0.7–3.1)
Lymphs: 12 %
MCH: 31 pg (ref 26.6–33.0)
MCHC: 34.9 g/dL (ref 31.5–35.7)
MCV: 89 fL (ref 79–97)
Monocytes Absolute: 0.7 10*3/uL (ref 0.1–0.9)
Monocytes: 6 %
Neutrophils Absolute: 10.1 10*3/uL — ABNORMAL HIGH (ref 1.4–7.0)
Neutrophils: 81 %
Platelets: 184 10*3/uL (ref 150–450)
RBC: 4.42 x10E6/uL (ref 3.77–5.28)
RDW: 12 % (ref 11.7–15.4)
RPR Ser Ql: NONREACTIVE
Rh Factor: POSITIVE
Rubella Antibodies, IGG: 3.07 index (ref >0.99)
WBC: 12.4 10*3/uL — ABNORMAL HIGH (ref 3.4–10.8)

## 2020-06-21 LAB — HCV INTERPRETATION

## 2020-06-22 LAB — CULTURE, OB URINE

## 2020-06-22 LAB — URINE CULTURE, OB REFLEX

## 2020-06-28 ENCOUNTER — Encounter: Payer: Self-pay | Admitting: Obstetrics and Gynecology

## 2020-07-03 ENCOUNTER — Other Ambulatory Visit: Payer: Self-pay

## 2020-07-03 ENCOUNTER — Ambulatory Visit (INDEPENDENT_AMBULATORY_CARE_PROVIDER_SITE_OTHER): Payer: Medicaid Other | Admitting: Women's Health

## 2020-07-03 VITALS — BP 128/74 | HR 94 | Wt 143.0 lb

## 2020-07-03 DIAGNOSIS — I491 Atrial premature depolarization: Secondary | ICD-10-CM

## 2020-07-03 DIAGNOSIS — Z3480 Encounter for supervision of other normal pregnancy, unspecified trimester: Secondary | ICD-10-CM

## 2020-07-03 DIAGNOSIS — E0590 Thyrotoxicosis, unspecified without thyrotoxic crisis or storm: Secondary | ICD-10-CM

## 2020-07-03 DIAGNOSIS — Z3A12 12 weeks gestation of pregnancy: Secondary | ICD-10-CM

## 2020-07-03 DIAGNOSIS — I493 Ventricular premature depolarization: Secondary | ICD-10-CM

## 2020-07-03 NOTE — Progress Notes (Signed)
Subjective:  Ashley Sparks is a 25 y.o. G2P1001 at [redacted]w[redacted]d being seen today for ongoing prenatal care.  She is currently monitored for the following issues for this low-risk pregnancy and has Lactose malabsorption; Subclinical hyperthyroidism; PAC (premature atrial contraction); PVC (premature ventricular contraction); Iron deficiency anemia secondary to blood loss (chronic); Anemia complicating pregnancy in third trimester; and Supervision of normal pregnancy, antepartum on their problem list.  Patient reports no complaints.  Contractions: Not present. Vag. Bleeding: None.   . Denies leaking of fluid.   The following portions of the patient's history were reviewed and updated as appropriate: allergies, current medications, past family history, past medical history, past social history, past surgical history and problem list. Problem list updated.  Objective:   Vitals:   07/03/20 1533  BP: 128/74  Pulse: 94  Weight: 143 lb (64.9 kg)    Fetal Status: Fetal Heart Rate (bpm): 160         General:  Alert, oriented and cooperative. Patient is in no acute distress.  Skin: Skin is warm and dry. No rash noted.   Cardiovascular: Normal heart rate noted  Respiratory: Normal respiratory effort, no problems with respiration noted  Abdomen: Soft, gravid, appropriate for gestational age. Pain/Pressure: Absent     Pelvic: Vag. Bleeding: None     Cervical exam deferred        Extremities: Normal range of motion.     Mental Status: Normal mood and affect. Normal behavior. Normal judgment and thought content.   Urinalysis:      Assessment and Plan:  Pregnancy: G2P1001 at [redacted]w[redacted]d  1. Supervision of other normal pregnancy, antepartum -CBE info given -anatomy scan ordered -PACs/PVCs - pt reports she was supposed to have f/u after her daughter was born 3 years ago, but she lost insurance coverage and did not get f/u, will refer to cardiology this pregnancy. Patient reports feeling palpitations in previous  pregnancy and she worse a monitor for a week and PACs/PVCs were discovered; pt reports palpitations now only with anxiety. -CBC normal on first trimester labs, no anemia present -thyroid labs - consulted with Dr. Debroah Loop, will refer to endocrinology for evaluation and recommendations, no medication at this time, pt reports subclinical hyperthyroid runs in her family and she has never had treatment for this in the past. Last time she was evaluated for this was 3 years ago when she was pregnant with her daughter.  Preterm labor symptoms and general obstetric precautions including but not limited to vaginal bleeding, contractions, leaking of fluid and fetal movement were reviewed in detail with the patient. I discussed the assessment and treatment plan with the patient. The patient was provided an opportunity to ask questions and all were answered. The patient agreed with the plan and demonstrated an understanding of the instructions. The patient was advised to call back or seek an in-person office evaluation/go to [facility] at [facility] for any urgent or concerning symptoms. Please refer to After Visit Summary for other counseling recommendations.  Return in about 4 weeks (around 07/31/2020) for in-person LOB/APP OK/AFP, needs anatomy scan, cardio and endo referrals entered.   Chia Rock, Odie Sera, NP

## 2020-07-03 NOTE — Patient Instructions (Signed)
Maternity Assessment Unit (MAU)  The Maternity Assessment Unit (MAU) is located at the [facility] at [facility]. The address is: [address] [place], [place], [place] [address]. Please see map below for additional directions.    The Maternity Assessment Unit is designed to help you during your pregnancy, and for up to 6 weeks after delivery, with any pregnancy- or postpartum-related emergencies, if you think you are in labor, or if your water has broken. For example, if you experience nausea and vomiting, vaginal bleeding, severe abdominal or pelvic pain, elevated blood pressure or other problems related to your pregnancy or postpartum time, please come to the Maternity Assessment Unit for assistance.       Childbirth Education Options: [facility] Department Classes:  Childbirth education classes can help you get ready for a positive parenting experience. You can also meet other expectant parents and get free stuff for your baby. Each class runs for five weeks on the same night and costs $45 for the mother-to-be and her support person. Medicaid covers the cost if you are eligible. Call [phone] to register. [facility] Childbirth Education:  [phone] or [phone] or [email or web address]  Baby & Me Class: Discuss newborn & infant parenting and family adjustment issues with other new mothers in a relaxed environment. Each week brings a new speaker or baby-centered activity. We encourage new mothers to join [place] every Thursday at 11:00am. Babies birth until crawling. No registration or fee. Daddy [employer]: This course offers Dads-to-be the tools and knowledge needed to feel confident on their journey to becoming new fathers. Experienced dads, who have been trained as coaches, teach dads-to-be how to hold, comfort, diaper, swaddle and play with their infant while being able to support the new mom as well. A class for men taught by  men. $25/dad Big Brother/Big Sister: Let your children share in the joy of a new brother or sister in this special class designed just for them. Class includes discussion about how families care for babies: swaddling, holding, diapering, safety as well as how they can be helpful in their new role. This class is designed for children ages 2 to 39, but any age is welcome. Please register each child individually. $5/child  Mom Talk: This mom-led group offers support and connection to mothers as they journey through the adjustments and struggles of that sometimes overwhelming first year after the birth of a child. Tuesdays at 10:00am and Thursdays at 6:00pm. Babies welcome. No registration or fee. Breastfeeding Support Group: This group is a mother-to-mother support circle where moms have the opportunity to share their breastfeeding experiences. A Lactation Consultant is present for questions and concerns. Meets each Tuesday at 11:00am. No fee or registration. Breastfeeding Your Baby: Learn what to expect in the first days of breastfeeding your newborn.  This class will help you feel more confident with the skills needed to begin your breastfeeding experience. Many new mothers are concerned about breastfeeding after leaving the hospital. This class will also address the most common fears and challenges about breastfeeding during the first few weeks, months and beyond. (call for fee) Comfort Techniques and Tour: This 2 hour interactive class will provide you the opportunity to learn & practice hands-on techniques that can help relieve some of the discomfort of labor and encourage your baby to rotate toward the best position for birth. You and your partner will be able to try a variety of labor positions with birth balls and rebozos as well  as practice breathing, relaxation, and visualization techniques. A tour of the [facility] is included with this class. $20 per registrant and support  person Childbirth Class- Weekend Option: This class is a Weekend version of our Birth & Baby series. It is designed for parents who have a difficult time fitting several weeks of classes into their schedule. It covers the care of your newborn and the basics of labor and childbirth. It also includes a Maternity Care Center Tour of [facility] and lunch. The class is held two consecutive days: beginning on Friday evening from 6:30 - 8:30 p.m. and the next day, Saturday from 9 a.m. - 4 p.m. (call for fee) Linden Dolin Class: Interested in a waterbirth?  This informational class will help you discover whether waterbirth is the right fit for you. Education about waterbirth itself, supplies you would need and how to assemble your support team is what you can expect from this class. Some obstetrical practices require this class in order to pursue a waterbirth. (Not all obstetrical practices offer waterbirth-check with your healthcare provider.) Register only the expectant mom, but you are encouraged to bring your partner to class! Required if planning waterbirth, no fee. Infant/Child CPR: Parents, grandparents, babysitters, and friends learn Cardio-Pulmonary Resuscitation skills for infants and children. You will also learn how to treat both conscious and unconscious choking in infants and children. This Family & Friends program does not offer certification. Register each participant individually to ensure that enough mannequins are available. (Call for fee) Grandparent Love: Expecting a grandbaby? This class is for you! Learn about the latest infant care and safety recommendations and ways to support your own child as he or she transitions into the parenting role. Taught by Registered Nurses who are childbirth instructors, but most importantly...they are grandmothers too! $10/person. Childbirth Class- Natural Childbirth: This series of 5 weekly classes is for expectant parents who want to learn and practice  natural methods of coping with the process of labor and childbirth. Relaxation, breathing, massage, visualization, role of the partner, and helpful positioning are highlighted. Participants learn how to be confident in their body's ability to give birth. This class will empower and help parents make informed decisions about their own care. Includes discussion that will help new parents transition into the immediate postpartum period. Maternity Care Center Tour of [facility] is included. We suggest taking this class between 25-32 weeks, but it's only a recommendation. $75 per registrant and one support person or $30 Medicaid. Childbirth Class- 3 week Series: This option of 3 weekly classes helps you and your labor partner prepare for childbirth. Newborn care, labor & birth, cesarean birth, pain management, and comfort techniques are discussed and a Maternity Care Center Tour of [facility] is included. The class meets at the same time, on the same day of the week for 3 consecutive weeks beginning with the starting date you choose. $60 for registrant and one support person.  Marvelous Multiples: Expecting twins, triplets, or more? This class covers the differences in labor, birth, parenting, and breastfeeding issues that face multiples' parents. NICU tour is included. Led by a Certified Childbirth Educator who is the mother of twins. No fee. Caring for Baby: This class is for expectant and adoptive parents who want to learn and practice the most up-to-date newborn care for their babies. Focus is on birth through the first six weeks of life. Topics include feeding, bathing, diapering, crying, umbilical cord care, circumcision care and safe sleep. Parents learn to  recognize symptoms of illness and when to call the pediatrician. Register only the mom-to-be and your partner or support person can plan to come with you! $10 per registrant and support person Childbirth Class- online option: This online class  offers you the freedom to complete a Birth and Baby series in the comfort of your own home. The flexibility of this option allows you to review sections at your own pace, at times convenient to you and your support people. It includes additional video information, animations, quizzes, and extended activities. Get organized with helpful eClass tools, checklists, and trackers. Once you register online for the class, you will receive an email within a few days to accept the invitation and begin the class when the time is right for you. The content will be available to you for 60 days. $60 for 60 days of online access for you and your support people.               Second Trimester of Pregnancy  The second trimester of pregnancy is from week 13 through week 27. This is months 4 through 6 of pregnancy. The second trimester is often a time when you feel your best. Your body has adjusted to being pregnant, and you begin to feel better physically. During the second trimester:  Morning sickness has lessened or stopped completely.  You may have more energy.  You may have an increase in appetite. The second trimester is also a time when the unborn baby (fetus) is growing rapidly. At the end of the sixth month, the fetus may be up to 12 inches long and weigh about 1 pounds. You will likely begin to feel the baby move (quickening) between 16 and 20 weeks of pregnancy. Body changes during your second trimester Your body continues to go through many changes during your second trimester. The changes vary and generally return to normal after the baby is born. Physical changes  Your weight will continue to increase. You will notice your lower abdomen bulging out.  You may begin to get stretch marks on your hips, abdomen, and breasts.  Your breasts will continue to grow and to become tender.  Dark spots or blotches (chloasma or mask of pregnancy) may develop on your face.  A dark line from your  belly button to the pubic area (linea nigra) may appear.  You may have changes in your hair. These can include thickening of your hair, rapid growth, and changes in texture. Some people also have hair loss during or after pregnancy, or hair that feels dry or thin. Health changes  You may develop headaches.  You may have heartburn.  You may develop constipation.  You may develop hemorrhoids or swollen, bulging veins (varicose veins).  Your gums may bleed and may be sensitive to brushing and flossing.  You may urinate more often because the fetus is pressing on your bladder.  You may have back pain. This is caused by: ? Weight gain. ? Pregnancy hormones that are relaxing the joints in your pelvis. ? A shift in weight and the muscles that support your balance. Follow these instructions at home: Medicines  Follow your health care provider's instructions regarding medicine use. Specific medicines may be either safe or unsafe to take during pregnancy. Do not take any medicines unless approved by your health care provider.  Take a prenatal vitamin that contains at least 600 micrograms (mcg) of folic acid. Eating and drinking  Eat a healthy diet that includes fresh fruits and  vegetables, whole grains, good sources of protein such as meat, eggs, or tofu, and low-fat dairy products.  Avoid raw meat and unpasteurized juice, milk, and cheese. These carry germs that can harm you and your baby.  You may need to take these actions to prevent or treat constipation: ? Drink enough fluid to keep your urine pale yellow. ? Eat foods that are high in fiber, such as beans, whole grains, and fresh fruits and vegetables. ? Limit foods that are high in fat and processed sugars, such as fried or sweet foods. Activity  Exercise only as directed by your health care provider. Most people can continue their usual exercise routine during pregnancy. Try to exercise for 30 minutes at least 5 days a week. Stop  exercising if you develop contractions in your uterus.  Stop exercising if you develop pain or cramping in the lower abdomen or lower back.  Avoid exercising if it is very hot or humid or if you are at a high altitude.  Avoid heavy lifting.  If you choose to, you may have sex unless your health care provider tells you not to. Relieving pain and discomfort  Wear a supportive bra to prevent discomfort from breast tenderness.  Take warm sitz baths to soothe any pain or discomfort caused by hemorrhoids. Use hemorrhoid cream if your health care provider approves.  Rest with your legs raised (elevated) if you have leg cramps or low back pain.  If you develop varicose veins: ? Wear support hose as told by your health care provider. ? Elevate your feet for 15 minutes, 3-4 times a day. ? Limit salt in your diet. Safety  Wear your seat belt at all times when driving or riding in a car.  Talk with your health care provider if someone is verbally or physically abusive to you. Lifestyle  Do not use hot tubs, steam rooms, or saunas.  Do not douche. Do not use tampons or scented sanitary pads.  Avoid cat litter boxes and soil used by cats. These carry germs that can cause birth defects in the baby and possibly loss of the fetus by miscarriage or stillbirth.  Do not use herbal remedies, alcohol, illegal drugs, or medicines that are not approved by your health care provider. Chemicals in these products can harm your baby.  Do not use any products that contain nicotine or tobacco, such as cigarettes, e-cigarettes, and chewing tobacco. If you need help quitting, ask your health care provider. General instructions  During a routine prenatal visit, your health care provider will do a physical exam and other tests. He or she will also discuss your overall health. Keep all follow-up visits. This is important.  Ask your health care provider for a referral to a local prenatal education class.  Ask  for help if you have counseling or nutritional needs during pregnancy. Your health care provider can offer advice or refer you to specialists for help with various needs. Where to find more information  American Pregnancy Association: americanpregnancy.org  [employer] of Obstetricians and Gynecologists: https://www.todd-brady.net/  Office on [employer] Health: [employer] Contact a health care provider if you have:  A headache that does not go away when you take medicine.  Vision changes or you see spots in front of your eyes.  Mild pelvic cramps, pelvic pressure, or nagging pain in the abdominal area.  Persistent nausea, vomiting, or diarrhea.  A bad-smelling vaginal discharge or foul-smelling urine.  Pain when you urinate.  Sudden or extreme swelling of  your face, hands, ankles, feet, or legs.  A fever. Get help right away if you:  Have fluid leaking from your vagina.  Have spotting or bleeding from your vagina.  Have severe abdominal cramping or pain.  Have difficulty breathing.  Have chest pain.  Have fainting spells.  Have not felt your baby move for the time period told by your health care provider.  Have new or increased pain, swelling, or redness in an arm or leg. Summary  The second trimester of pregnancy is from week 13 through week 27 (months 4 through 6).  Do not use herbal remedies, alcohol, illegal drugs, or medicines that are not approved by your health care provider. Chemicals in these products can harm your baby.  Exercise only as directed by your health care provider. Most people can continue their usual exercise routine during pregnancy.  Keep all follow-up visits. This is important. This information is not intended to replace advice given to you by your health care provider. Make sure you discuss any questions you have with your health care provider. Document Revised: 10/04/2019 Document Reviewed: 08/10/2019 Elsevier  Patient Education  2021 Elsevier Inc.        Alpha-Fetoprotein Test Why am I having this test? The alpha-fetoprotein test is a lab test most commonly used for pregnant women to help screen for birth defects in their unborn baby. It can be used to screen for chromosome (DNA) abnormalities, problems with the brain or spinal cord, or problems with the abdominal wall of the unborn baby (fetus). The alpha-fetoprotein test may also be done for men or nonpregnant women to check for certain cancers. What is being tested? This test measures the amount of alpha-fetoprotein (AFP) in your blood. AFP is a protein that is made by the liver. Levels can be detected in the mother's blood during pregnancy, starting at 10 weeks and peaking at 16-18 weeks of the pregnancy. Abnormal levels can sometimes be a sign of a birth defect in the baby. Certain cancers can cause a high level of AFP in men and nonpregnant women. What kind of sample is taken? A blood sample is required for this test. It is usually collected by inserting a needle into a blood vessel.   How are the results reported? Your test results will be reported as values. Your health care provider will compare your results to normal ranges that were established after testing a large group of people (reference values). Reference values may vary among labs and hospitals. For this test, common reference values are:  Adult: Less than 40 ng/mL or less than 40 mcg/L (SI units).  Child younger than 1 year: Less than 30 ng/mL. If you are pregnant, the values may also vary based on how long you have been pregnant. What do the results mean? Results that are above the reference values in pregnant women may indicate the following for the baby:  Neural tube defects, such as abnormalities of the spinal cord or brain.  Abdominal wall defects.  Multiple pregnancy such as twins.  Fetal distress or fetal death. Results that are above the reference values in men  or nonpregnant women may indicate:  Reproductive cancers, such as ovarian or testicular cancer.  Liver cancer.  Liver cell death.  Other types of cancer. Very low levels of AFP in pregnant women may indicate Down syndrome for the baby. Talk with your health care provider about what your results mean. Questions to ask your health care provider Ask your health care provider,  or the department that is doing the test:  When will my results be ready?  How will I get my results?  What are my treatment options?  What other tests do I need?  What are my next steps? Summary  The alpha-fetoprotein test is done on pregnant women to help screen for birth defects in their unborn baby.  Certain cancers can cause a high level of AFP in men and nonpregnant women.  For this test, a blood sample is usually collected by inserting a needle into a blood vessel.  Talk with your health care provider about what your results mean. This information is not intended to replace advice given to you by your health care provider. Make sure you discuss any questions you have with your health care provider. Document Revised: 11/17/2019 Document Reviewed: 11/17/2019 Elsevier Patient Education  2021 [employer]

## 2020-07-04 ENCOUNTER — Encounter: Payer: Medicaid Other | Admitting: Obstetrics and Gynecology

## 2020-07-09 ENCOUNTER — Encounter: Payer: Self-pay | Admitting: Obstetrics and Gynecology

## 2020-07-11 ENCOUNTER — Encounter: Payer: Self-pay | Admitting: Obstetrics and Gynecology

## 2020-07-18 NOTE — Progress Notes (Signed)
Cardiology Office Note:   Date:  07/19/2020  NAME:  Ashley Sparks    MRN: [id number] DOB:  02-10-1996   PCP:  Patient, No Pcp Per  Cardiologist:  No primary care provider on file.   Referring MD: Marylen Ponto, NP   Chief Complaint  Patient presents with  . Palpitations   History of Present Illness:   Ashley Sparks is a 25 y.o. female with a hx of PACs, subclinical hyperthyroidism who is being seen today for the evaluation of palpitations at the request of Nugent, Odie Sera, NP. Evaluated in 2018 for palpitations during pregnancy and had occasional PACs. Normal echo and follow-up was planned as needed. 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 She reports that she had similar symptoms when she was pregnant roughly 3 years ago.  She had extensive work-up that included a normal echocardiogram and monitor with brief PACs.  She reports she is getting heart racing episodes.  They can occur at any times.  They last seconds to minutes.  She also reports exertional shortness of breath and chest pain.  She reports anxiety and stress as well.  She is also been diagnosed with subclinical hyperthyroidism.  She has no evidence of heart failure examination.  Her EKG demonstrates sinus rhythm with a heart rate of 80.  She has no evidence of heart failure.  She is a never smoker.  She does not drink alcohol or use drugs.  No strong family history of heart disease.  She reports that when she did get pregnant last time she gets dizzy and lightheaded.  She is also having that.  She is not drinking nearly enough water.  I recommended 80 to 100 ounces of water daily.  She will start to do this.  She should also start light exercise.  I think this is reasonable.  In office BP 90/60.  She reports no dizziness or lightheadedness.  Again EKG and cardiovascular lamination are normal.  She will see endocrinologist next week.  I think this is very important.  We need to have her hyperthyroidism addressed.  Problem  List 1. PACs -2018 -EF 60% 2. Subclinical hyperthyroidism -TSH 0.01 T4 1.19  Past Medical History: Past Medical History:  Diagnosis Date  . Constipation   . Depression   . Gluten intolerance   . Hypothyroid   . Keloid   . Lactose intolerance   . PAC (premature atrial contraction) 08/24/2016  . Palpitations 07/01/2016  . PVC (premature ventricular contraction) 08/24/2016  . Seasonal allergies   . Shortness of breath 07/01/2016   typically during allergy season  . UTI (urinary tract infection)     Past Surgical History: History reviewed. No pertinent surgical history.  Current Medications: No outpatient medications have been marked as taking for the 07/19/20 encounter (Office Visit) with Sande Rives, MD.     Allergies:    Lactose intolerance (gi), Gluten meal, and Macrobid [nitrofurantoin monohyd macro]   Social History: Social History   Socioeconomic History  . Marital status: Single    Spouse name: Not on file  . Number of children: 1  . Years of education: Not on file  . Highest education level: Not on file  Occupational History  . Occupation: unemployed  Tobacco Use  . Smoking status: Never Smoker  . Smokeless tobacco: Never Used  Vaping Use  . Vaping Use: Never used  Substance and Sexual Activity  . Alcohol use: No  . Drug use: Not Currently  . Sexual activity: Yes  Partners: Male    Birth control/protection: None    Comment: Pregnant   Other Topics Concern  . Not on file  Social History Narrative   10th grade (online)   Social Determinants of Health   Financial Resource Strain: Not on file  Food Insecurity: Not on file  Transportation Needs: Not on file  Physical Activity: Not on file  Stress: Not on file  Social Connections: Not on file     Family History: The patient's family history includes Diabetes in her mother; Hyperthyroidism in her maternal aunt; Seizures in her mother. There is no history of Celiac disease, Cholelithiasis,  or Ulcers. She was adopted.  ROS:   All other ROS reviewed and negative. Pertinent positives noted in the HPI.     EKGs/Labs/Other Studies Reviewed:   The following studies were personally reviewed by me today:  EKG:  EKG is ordered today.  The ekg ordered today demonstrates normal sinus rhythm heart rate 80, no acute ischemic changes or evidence of infarction, and was personally reviewed by me.   TTE 07/17/2016 - Left ventricle: The cavity size was normal. Wall thickness was  normal. Systolic function was normal. The estimated ejection  fraction was in the range of 60% to 65%. Wall motion was normal;  there were no regional wall motion abnormalities. Left  ventricular diastolic function parameters were normal.  - Left atrium: The atrium was normal in size.  - Inferior vena cava: The vessel was normal in size. The  respirophasic diameter changes were in the normal range (>= 50%),  consistent with normal central venous pressure.   Holter 07/22/2016 Occasional PACs No arrhythmias   Recent Labs: 05/19/2020: ALT 19; B Natriuretic Peptide 30.8; BUN 8; Creatinine, Ser 0.60; Potassium 4.1; Sodium 135 06/06/2020: TSH 0.015 06/20/2020: Hemoglobin 13.7; Platelets 184   Recent Lipid Panel No results found for: CHOL, TRIG, HDL, CHOLHDL, VLDL, LDLCALC, LDLDIRECT  Physical Exam:   VS:  BP 90/60 (BP Location: Left Arm, Patient Position: Sitting)   Pulse 80   Ht 5\' 1"  (1.549 m)   Wt 150 lb 6.4 oz (68.2 kg)   LMP 03/16/2020   SpO2 99%   BMI 28.42 kg/m    Wt Readings from Last 3 Encounters:  07/19/20 150 lb 6.4 oz (68.2 kg)  07/03/20 143 lb (64.9 kg)  06/06/20 141 lb (64 kg)    General: Well nourished, well developed, in no acute distress Head: Atraumatic, normal size  Eyes: PEERLA, EOMI  Neck: Supple, no JVD Endocrine: No thryomegaly Cardiac: Normal S1, S2; RRR; no murmurs, rubs, or gallops Lungs: Clear to auscultation bilaterally, no wheezing, rhonchi or rales  Abd: Soft,  nontender, no hepatomegaly  Ext: No edema, pulses 2+ Musculoskeletal: No deformities, BUE and BLE strength normal and equal Skin: Warm and dry, no rashes   Neuro: Alert and oriented to person, place, time, and situation, CNII-XII grossly intact, no focal deficits  Psych: Normal mood and affect   ASSESSMENT:   Ashley Sparks is a 25 y.o. female who presents for the following: 1. Palpitations   2. PAC (premature atrial contraction)   3. Dizziness   4. SOB (shortness of breath)     PLAN:   1. Palpitations 2. PAC (premature atrial contraction) 3. Dizziness 4. SOB (shortness of breath) -She presents with a constellation of symptoms including palpitations, shortness of breath, dizziness, chest pain.  She has no evidence of volume overload on exam.  Her cardiovascular examination is normal.  Her EKG demonstrates normal sinus  rhythm with no acute ischemic changes or evidence of infarction.  She had similar symptoms in the past.  She was found to have PACs.  I think it is reasonable to repeat a heart monitor.  She will her 7-day Zio patch.  -I think ultimately her thyroid is the problem here.  She will see endocrinology next week for subclinical hyperthyroidism.  I have significant concerns about this and she needs this addressed.  -I suspect most of her symptoms are from dehydration and not drinking enough water.  She should do this.   -She also had anemia during her last pregnancy.  She should have this closely followed by her obstetrician.   -She had a normal echocardiogram in 2018 and we will not repeat this.  -I will plan to notify her of her monitor results over the phone.  If she needs to see us back she will.  I did discuss with her that if we find minimal PACs we will not treat these to not cause any potential harm to baby.  Her symptoms improved after pregnancy the last time. I suspect they will again.   Disposition: Return if symptoms worsen or fail to improve.  Medication  Adjustments/Labs and Tests Ordered: Current medicines are reviewed at length with the patient today.  Concerns regarding medicines are outlined above.  Orders Placed This Encounter  Procedures  . LONG TERM MONITOR (3-14 DAYS)  . EKG 12-Lead   No orders of the defined types were placed in this encounter.   Patient Instructions  Medication Instructions:  The current medical regimen is effective;  continue present plan and medications.  *If you need a refill on your cardiac medications before your next appointment, please call your pharmacy*   Testing/Procedures: ZIO XT- Long Term Monitor Instructions   Your physician has requested you wear your ZIO patch monitor____7___days.   This is a single patch monitor.  [employer] supplies one patch monitor per enrollment.  Additional stickers are not available.   Please do not apply patch if you will be having a Nuclear Stress Test, Echocardiogram, Cardiac CT, MRI, or Chest Xray during the time frame you would be wearing the monitor. The patch cannot be worn during these tests.  You cannot remove and re-apply the ZIO XT patch monitor.   Your ZIO patch monitor will be sent USPS Priority mail from [facility] directly to your home address. The monitor may also be mailed to a PO BOX if home delivery is not available.   It may take 3-5 days to receive your monitor after you have been enrolled.   Once you have received you monitor, please review enclosed instructions.  Your monitor has already been registered assigning a specific monitor serial # to you.   Applying the monitor   Shave hair from upper left chest.   Hold abrader disc by orange tab.  Rub abrader in 40 strokes over left upper chest as indicated in your monitor instructions.   Clean area with 4 enclosed alcohol pads .  Use all pads to assure are is cleaned thoroughly.  Let dry.   Apply patch as indicated in monitor instructions.  Patch will be place under collarbone on left  side of chest with arrow pointing upward.   Rub patch adhesive wings for 2 minutes.Remove white label marked "1".  Remove white label marked "2".  Rub patch adhesive wings for 2 additional minutes.   While looking in a mirror, press and release button in center of patch.  A small green light will flash 3-4 times .  This will be your only indicator the monitor has been turned on.     Do not shower for the first 24 hours.  You may shower after the first 24 hours.   Press button if you feel a symptom. You will hear a small click.  Record Date, Time and Symptom in the Patient Log Book.   When you are ready to remove patch, follow instructions on last 2 pages of Patient Log Book.  Stick patch monitor onto last page of Patient Log Book.   Place Patient Log Book in [place] box.  Use locking tab on box and tape box closed securely.  The [employer] and [employer] has [employer] on it.  Please place in mailbox as soon as possible.  Your physician should have your test results approximately 7 days after the monitor has been mailed back to [facility].   Call [facility] Customer Care at [phone] if you have questions regarding your ZIO XT patch monitor.  Call them immediately if you see an orange light blinking on your monitor.   If your monitor falls off in less than 4 days contact our Monitor department at [phone].  If your monitor becomes loose or falls off after 4 days call [employer] at [phone] for suggestions on securing your monitor.     Follow-Up: At [facility], you and your health needs are our priority.  As part of our continuing mission to provide you with exceptional heart care, we have created designated Provider Care Teams.  These Care Teams include your primary Cardiologist (physician) and Advanced Practice Providers (APPs -  Physician Assistants and Nurse Practitioners) who all work together to provide you with the care you need, when you need it.  We recommend  signing up for the patient portal called "MyChart".  Sign up information is provided on this After Visit Summary.  MyChart is used to connect with patients for Virtual Visits (Telemedicine).  Patients are able to view lab/test results, encounter notes, upcoming appointments, etc.  Non-urgent messages can be sent to your provider as well.   To learn more about what you can do with MyChart, go to [email or web address].    Your next appointment:   As needed  The format for your next appointment:   In Person  Provider:   Lennie Odor, MD      Signed, Lenna Gilford. Flora Lipps, MD, [facility]  [facility]  [address], [address] [place], [place] [address] [phone]  07/19/2020 3:31 PM

## 2020-07-19 ENCOUNTER — Encounter: Payer: Self-pay | Admitting: Radiology

## 2020-07-19 ENCOUNTER — Other Ambulatory Visit: Payer: Self-pay

## 2020-07-19 ENCOUNTER — Ambulatory Visit: Payer: Medicaid Other

## 2020-07-19 ENCOUNTER — Ambulatory Visit (INDEPENDENT_AMBULATORY_CARE_PROVIDER_SITE_OTHER): Payer: Medicaid Other | Admitting: Cardiovascular Disease

## 2020-07-19 ENCOUNTER — Encounter: Payer: Self-pay | Admitting: Cardiovascular Disease

## 2020-07-19 VITALS — BP 90/60 | HR 80 | Ht 61.0 in | Wt 150.4 lb

## 2020-07-19 DIAGNOSIS — R0602 Shortness of breath: Secondary | ICD-10-CM | POA: Diagnosis not present

## 2020-07-19 DIAGNOSIS — R002 Palpitations: Secondary | ICD-10-CM

## 2020-07-19 DIAGNOSIS — R42 Dizziness and giddiness: Secondary | ICD-10-CM

## 2020-07-19 DIAGNOSIS — I491 Atrial premature depolarization: Secondary | ICD-10-CM | POA: Diagnosis not present

## 2020-07-19 NOTE — Patient Instructions (Signed)
Medication Instructions:  The current medical regimen is effective;  continue present plan and medications.  *If you need a refill on your cardiac medications before your next appointment, please call your pharmacy*  Testing/Procedures: ZIO XT- Long Term Monitor Instructions   Your physician has requested you wear your ZIO patch monitor___7____days.   This is a single patch monitor.  [employer] supplies one patch monitor per enrollment.  Additional stickers are not available.   Please do not apply patch if you will be having a Nuclear Stress Test, Echocardiogram, Cardiac CT, MRI, or Chest Xray during the time frame you would be wearing the monitor. The patch cannot be worn during these tests.  You cannot remove and re-apply the ZIO XT patch monitor.   Your ZIO patch monitor will be sent USPS Priority mail from [employer] directly to your home address. The monitor may also be mailed to a PO BOX if home delivery is not available.   It may take 3-5 days to receive your monitor after you have been enrolled.   Once you have received you monitor, please review enclosed instructions.  Your monitor has already been registered assigning a specific monitor serial # to you.   Applying the monitor   Shave hair from upper left chest.   Hold abrader disc by orange tab.  Rub abrader in 40 strokes over left upper chest as indicated in your monitor instructions.   Clean area with 4 enclosed alcohol pads .  Use all pads to assure are is cleaned thoroughly.  Let dry.   Apply patch as indicated in monitor instructions.  Patch will be place under collarbone on left side of chest with arrow pointing upward.   Rub patch adhesive wings for 2 minutes.Remove white label marked "1".  Remove white label marked "2".  Rub patch adhesive wings for 2 additional minutes.   While looking in a mirror, press and release button in center of patch.  A small green light will flash 3-4 times .  This will be your only  indicator the monitor has been turned on.     Do not shower for the first 24 hours.  You may shower after the first 24 hours.   Press button if you feel a symptom. You will hear a small click.  Record Date, Time and Symptom in the Patient Log Book.   When you are ready to remove patch, follow instructions on last 2 pages of Patient Log Book.  Stick patch monitor onto last page of Patient Log Book.   Place Patient Log Book in Blue box.  Use locking tab on box and tape box closed securely.  The [employer] box has prepaid postage on it.  Please place in mailbox as soon as possible.  Your physician should have your test results approximately 7 days after the monitor has been mailed back to [employer].   Call [employer] Customer Care at [phone] if you have questions regarding your ZIO XT patch monitor.  Call them immediately if you see an orange light blinking on your monitor.   If your monitor falls off in less than 4 days contact our Monitor department at [phone].  If your monitor becomes loose or falls off after 4 days call [employer] at [phone] for suggestions on securing your monitor.     Follow-Up: At [facility], you and your health needs are our priority.  As part of our continuing mission to provide you with exceptional heart care, we have   created designated Provider Care Teams.  These Care Teams include your primary Cardiologist (physician) and Advanced Practice Providers (APPs -  Physician Assistants and Nurse Practitioners) who all work together to provide you with the care you need, when you need it.  We recommend signing up for the patient portal called "MyChart".  Sign up information is provided on this After Visit Summary.  MyChart is used to connect with patients for Virtual Visits (Telemedicine).  Patients are able to view lab/test results, encounter notes, upcoming appointments, etc.  Non-urgent messages can be sent to your provider as well.   To  learn more about what you can do with MyChart, go to [email or web address].    Your next appointment:   As needed  The format for your next appointment:   In Person  Provider:   Paris O'Neal, MD     

## 2020-07-19 NOTE — Progress Notes (Signed)
Enrolled patient for a 7 day Zio XT Monitor to be mailed to patients home.  

## 2020-07-23 ENCOUNTER — Ambulatory Visit
Admission: EM | Admit: 2020-07-23 | Discharge: 2020-07-23 | Disposition: A | Discharge status: Home / Self Care | Payer: Medicaid Other

## 2020-07-23 ENCOUNTER — Encounter: Payer: Self-pay | Admitting: Emergency Medicine

## 2020-07-23 ENCOUNTER — Other Ambulatory Visit: Payer: Self-pay

## 2020-07-23 DIAGNOSIS — S61412A Laceration without foreign body of left hand, initial encounter: Secondary | ICD-10-CM

## 2020-07-23 NOTE — Discharge Instructions (Signed)
Please return for any signs of infection-increased redness swelling pain or drainage from wound

## 2020-07-23 NOTE — ED Triage Notes (Signed)
Patient c/o LFT hand injury that started today.   Patient endorses that she punched a window upon onset of symptoms.   Patient endorses lacerations on hand.   Patient endorses that bleeding has subsided.   Patient has hand wrapped in a tissue currently.

## 2020-07-24 ENCOUNTER — Other Ambulatory Visit: Payer: Self-pay

## 2020-07-24 NOTE — ED Provider Notes (Signed)
[name]    CSN: [id number] Arrival date & time: 07/23/20  1821      History   Chief Complaint Chief Complaint  Patient presents with  . Hand Injury    HPI Ashley Sparks is a 25 y.o. female presenting today for evaluation of lacerations to left hand.  Reports earlier today punched a window, window cracked.  Reports that when it was 10.  She denies any difficulty bending any of her fingers or hand since, her main concern has been a few lacerations to her hand.  Last tetanus 2018.  Currently pregnant.  HPI  Past Medical History:  Diagnosis Date  . Constipation   . Depression   . Gluten intolerance   . Hypothyroid   . Keloid   . Lactose intolerance   . PAC (premature atrial contraction) 08/24/2016  . Palpitations 07/01/2016  . PVC (premature ventricular contraction) 08/24/2016  . Seasonal allergies   . Shortness of breath 07/01/2016   typically during allergy season  . UTI (urinary tract infection)     Patient Active Problem List   Diagnosis Date Noted  . Supervision of normal pregnancy, antepartum 06/06/2020  . Anemia complicating pregnancy in third trimester 11/16/2016  . 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 PAC (premature atrial contraction) 08/24/2016  . PVC (premature ventricular contraction) 08/24/2016  . 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 No pertinent surgical history.  OB History    Gravida  2   Para  1   Term  1   Preterm  0   AB  0   Living  1     SAB  0   IAB  0   Ectopic  0   Multiple  0   Live Births  1            Home Medications    Prior to Admission medications   Medication Sig Start Date End Date Taking? Authorizing Provider  Prenatal MV & Min w/FA-DHA (ONE A DAY PRENATAL PO) Take by mouth.    [provider]  promethazine (PHENERGAN) 25 MG tablet Take 0.5-1 tablets (12.5-25 mg total) by mouth every 6  (six) hours as needed for nausea or vomiting. 05/19/20   Armando Reichert, CNM    Family History Family History  Adopted: Yes  Problem Relation Age of Onset  . Diabetes Mother   . Seizures Mother   . Hyperthyroidism Maternal Aunt   . Celiac disease Neg Hx   . Cholelithiasis Neg Hx   . Ulcers Neg Hx     Social History Social History   Tobacco Use  . Smoking status: Never Smoker  . Smokeless tobacco: Never Used  Vaping Use  . Vaping Use: Never used  Substance Use Topics  . Alcohol use: No  . Drug use: Not Currently     Allergies   Lactose intolerance (gi), Gluten meal, and Macrobid [nitrofurantoin monohyd macro]   Review of Systems Review of Systems  Constitutional: Negative for fatigue and fever.  HENT: Negative for mouth sores.   Eyes: Negative for visual disturbance.  Respiratory: Negative for shortness of breath.   Cardiovascular: Negative for chest pain.  Gastrointestinal: Negative for abdominal pain, nausea and vomiting.  Genitourinary: Negative for genital sores.  Musculoskeletal: Negative for arthralgias and joint swelling.  Skin: Positive for wound. Negative for color change and rash.  Neurological: Negative for dizziness, weakness, light-headedness and headaches.  Physical Exam Triage Vital Signs ED Triage Vitals  Enc Vitals Group     BP 07/23/20 1934 112/61     Pulse Rate 07/23/20 1934 84     Resp 07/23/20 1934 16     Temp 07/23/20 1934 98.3 F (36.8 C)     Temp Source 07/23/20 1934 Oral     SpO2 07/23/20 1934 97 %     Weight --      Height --      Head Circumference --      Peak Flow --      Pain Score 07/23/20 1932 0     Pain Loc --      Pain Edu? --      Excl. in GC? --    No data found.  Updated Vital Signs BP 112/61 (BP Location: Right Arm)   Pulse 84   Temp 98.3 F (36.8 C) (Oral)   Resp 16   LMP 03/16/2020   SpO2 97%   Visual Acuity Right Eye Distance:   Left Eye Distance:   Bilateral Distance:    Right Eye Near:    Left Eye Near:    Bilateral Near:     Physical Exam Vitals and nursing note reviewed.  Constitutional:      Appearance: She is well-developed.     Comments: No acute distress  HENT:     Head: Normocephalic and atraumatic.     Nose: Nose normal.  Eyes:     Conjunctiva/sclera: Conjunctivae normal.  Cardiovascular:     Rate and Rhythm: Normal rate.  Pulmonary:     Effort: Pulmonary effort is normal. No respiratory distress.  Abdominal:     General: There is no distension.  Musculoskeletal:        General: Normal range of motion.     Cervical back: Neck supple.     Comments: Left hand with full active range of motion of all 5 fingers at MCP DIP and PIP, full active wrist motion at wrist, radial pulse 2+  Skin:    General: Skin is warm and dry.     Comments: Left wrist with superficial abrasions noted, left palmar thenar eminence with approximately 2 cm linear laceration, edges well reapproximated  Neurological:     Mental Status: She is alert and oriented to person, place, and time.      UC Treatments / Results  Labs (all labs ordered are listed, but only abnormal results are displayed) Labs Reviewed - No data to display  EKG   Radiology No results found.  Procedures Procedures (including critical care time)  Medications Ordered in UC Medications - No data to display  Initial Impression / Assessment and Plan / UC Course  I have reviewed the triage vital signs and the nursing notes.  Pertinent labs & imaging results that were available during my care of the patient were reviewed by me and considered in my medical decision making (see chart for details).     Wounds cleansed with soap and water, area on palmar aspect closed with Dermabond, other wound superficial.  Tetanus up-to-date.  Discussed wound care.  Monitor for healing, low suspicion of underlying fracture at this time.Discussed strict return precautions. Patient verbalized understanding and is agreeable  with plan.  Final Clinical Impressions(s) / UC Diagnoses   Final diagnoses:  Laceration of left hand without foreign body, initial encounter     Discharge Instructions     Please return for any signs of infection-increased redness swelling pain or  drainage from wound    ED Prescriptions    None     PDMP not reviewed this encounter.   Lew Dawes, PA-C 07/24/20 1144

## 2020-07-24 NOTE — Progress Notes (Signed)
Name: Ashley Sparks  MRN/ DOB: [id number], 09-03-1995    Age/ Sex: 25 y.o., female    PCP: Patient, No Pcp Per   Reason for Endocrinology Evaluation: Subclinical hyperthyroidism     Date of Initial Endocrinology Evaluation: 07/25/2020     HPI: Ms. Ashley Sparks is a 25 y.o. female with unremarkable past medical history. The patient presented for initial endocrinology clinic visit on 07/25/2020 for consultative assistance with her Subclinical Hyperthyroidism.   She was been noted with low TSH intermittently since 2018, with the lates on 06/06/2020 at 0.015 uIU/mL but with normal T4 at 9.2 ug/dL.   She has a 38 yr old daughter, currently at 15.2 weeks of gestation    Denies weight loss but has has been having morning sickness  Denies diarrhea more constipation  Has noted tremors and palpitations  Saw cariology and has  Heart monitor on  No local neck symptoms  No biotin except in the prenatal  Denies eye symptoms    EDD 01/14/2021    Maternal Aunt with thyroidectomy   HISTORY:  Past Medical History:  Past Medical History:  Diagnosis Date  . Constipation   . Depression   . Gluten intolerance   . Hypothyroid   . Keloid   . Lactose intolerance   . PAC (premature atrial contraction) 08/24/2016  . Palpitations 07/01/2016  . PVC (premature ventricular contraction) 08/24/2016  . Seasonal allergies   . Shortness of breath 07/01/2016   typically during allergy season  . UTI (urinary tract infection)     Past Surgical History: No past surgical history on file.   Social History:  reports that she has never smoked. She has never used smokeless tobacco. She reports previous drug use. She reports that she does not drink alcohol.  Family History: family history includes Diabetes in her mother; Hyperthyroidism in her maternal aunt; Seizures in her mother. She was adopted.   HOME MEDICATIONS: Allergies as of 07/25/2020      Reactions   Lactose Intolerance (gi) Diarrhea, Other  (See Comments)   Gi upset   Gluten Meal Hives, Diarrhea, Other (See Comments)   And abdominal pain   Macrobid [nitrofurantoin Monohyd Macro] Hives, Rash      Medication List       Accurate as of July 25, 2020 11:32 AM. If you have any questions, ask your nurse or doctor.        STOP taking these medications   promethazine 25 MG tablet Commonly known as: PHENERGAN Stopped by: Scarlette Shorts, MD     TAKE these medications   ONE A DAY PRENATAL PO Take by mouth.         REVIEW OF SYSTEMS: A comprehensive ROS was conducted with the patient and is negative except as per HPI and below:  ROS     OBJECTIVE:  VS: BP 116/72   Pulse 85   Ht 5\' 1"  (1.549 m)   Wt 151 lb 8 oz (68.7 kg)   LMP 03/16/2020   SpO2 99%   BMI 28.63 kg/m    Wt Readings from Last 3 Encounters:  07/25/20 151 lb 8 oz (68.7 kg)  07/19/20 150 lb 6.4 oz (68.2 kg)  07/03/20 143 lb (64.9 kg)     EXAM: General: Pt appears well and is in NAD  Hydration: Well-hydrated with moist mucous membranes and good skin turgor  Eyes: External eye exam normal without stare, lid lag or exophthalmos.  EOM intact.  PERRL.  Ears, Nose,  Throat: Hearing: Grossly intact bilaterally Dental: Good dentition  Throat: Clear without mass, erythema or exudate  Neck: General: Supple without adenopathy. Thyroid: Thyroid size normal.  No goiter or nodules appreciated. No thyroid bruit.  Lungs: Clear with good BS bilat with no rales, rhonchi, or wheezes  Heart: Auscultation: RRR.  Abdomen: Normoactive bowel sounds, soft, nontender, without masses or organomegaly palpable  Extremities: Gait and station: Normal gait  Digits and nails: No clubbing, cyanosis, petechiae, or nodes Head and neck: Normal alignment and mobility BL UE: Normal ROM and strength. BL LE: No pretibial edema normal ROM and strength.  Skin: Hair: Texture and amount normal with gender appropriate distribution Skin Inspection: No rashes, acanthosis  nigricans/skin tags. No lipohypertrophy Skin Palpation: Skin temperature, texture, and thickness normal to palpation  Neuro: Cranial nerves: II - XII grossly intact  Cerebellar: Normal coordination and movement; no tremor Motor: Normal strength throughout DTRs: 2+ and symmetric in UE without delay in relaxation phase  Mental Status: Judgment, insight: Intact Orientation: Oriented to time, place, and person Memory: Intact for recent and remote events Mood and affect: No depression, anxiety, or agitation     DATA REVIEWED: Results for Ashley Sparks, Ashley Sparks (MRN [id number]) as of 07/26/2020 09:45  Ref. Range 07/25/2020 12:04  TSH Latest Ref Range: 0.35 - 4.50 uIU/mL 0.53  Triiodothyronine (T3) Latest Ref Range: 76 - 181 ng/dL 774  Thyroxine (T4) Latest Ref Range: 5.1 - 11.9 mcg/dL 9.6     Results for Ashley Sparks, Ashley Sparks (MRN [id number]) as of 07/25/2020 11:51  Ref. Range 05/19/2020 14:18  Sodium Latest Ref Range: 135 - 145 mmol/L 135  Potassium Latest Ref Range: 3.5 - 5.1 mmol/L 4.1  Chloride Latest Ref Range: 98 - 111 mmol/L 103  CO2 Latest Ref Range: 22 - 32 mmol/L 19 (L)  Glucose Latest Ref Range: 70 - 99 mg/dL 93  BUN Latest Ref Range: 6 - 20 mg/dL 8  Creatinine Latest Ref Range: 0.44 - 1.00 mg/dL 2.09  Calcium Latest Ref Range: 8.9 - 10.3 mg/dL 9.7  Anion gap Latest Ref Range: 5 - 15  13  Alkaline Phosphatase Latest Ref Range: 38 - 126 U/L 52  Albumin Latest Ref Range: 3.5 - 5.0 g/dL 4.2  AST Latest Ref Range: 15 - 41 U/L 22  ALT Latest Ref Range: 0 - 44 U/L 19  Total Protein Latest Ref Range: 6.5 - 8.1 g/dL 7.4  Total Bilirubin Latest Ref Range: 0.3 - 1.2 mg/dL 1.1  GFR, Estimated Latest Ref Range: >60 mL/min >60    Results for Ashley Sparks, Ashley Sparks (MRN [id number]) as of 07/25/2020 11:51  Ref. Range 06/20/2020 13:36  WBC Latest Ref Range: 3.4 - 10.8 x10E3/uL 12.4 (H)  RBC Latest Ref Range: 3.77 - 5.28 x10E6/uL 4.42  Hemoglobin Latest Ref Range: 11.1 - 15.9 g/dL 62.9  HCT Latest Ref Range:  34.0 - 46.6 % 39.3  MCV Latest Ref Range: 79 - 97 fL 89  MCH Latest Ref Range: 26.6 - 33.0 pg 31.0  MCHC Latest Ref Range: 31.5 - 35.7 g/dL 47.6  RDW Latest Ref Range: 11.7 - 15.4 % 12.0  Platelets Latest Ref Range: 150 - 450 x10E3/uL 184  Neutrophils Latest Ref Range: Not Estab. % 81  Immature Granulocytes Latest Ref Range: Not Estab. % 0  NEUT# Latest Ref Range: 1.4 - 7.0 x10E3/uL 10.1 (H)  Lymphocyte # Latest Ref Range: 0.7 - 3.1 x10E3/uL 1.5  Monocytes Absolute Latest Ref Range: 0.1 - 0.9 x10E3/uL 0.7    ASSESSMENT/PLAN/RECOMMENDATIONS:   1. Subclinical  hyperthyroidism during pregnancy   - We discussed D/D of  Pregnancy related hyperthyroidism, graves' disease autonomous thyroid nodule - Pt is clinically euthyroid  - NO local neck symptoms  - The goal of treatment is to maintain persistent but mild hyperthyroidism in the mother in an attempt to prevent fetal hypothyroidism since the fetal thyroid is more sensitive to the action of thionamide therapy. Overtreatment of maternal hyperthyroidism can cause fetal goiter and primary hypothyroidism.  - Since the trimester specific TFT levels are not available, will have her T4/T3 level goal at 1.5 times above the nonpregnant reference range. The TSH goal should be mainatained 0.1-0.3 uIU/mL  - Repeat TFT's have normalized    F/u in 2 months Labs in 4 weeks   Signed electronically by: Lyndle Herrlich, MD  [facility] Endocrinology  [facility] [address] [place] [place], [place] [address] Phone: [phone] FAX: [phone]   CC: Patient, No Pcp Per No address on file Phone: None Fax: None   Return to Endocrinology clinic as below: Future Appointments  Date Time Provider Department Center  07/31/2020  3:30 PM Warden Fillers, MD [facility] None  08/22/2020  2:45 PM [facility] US4 [facility]

## 2020-07-25 ENCOUNTER — Encounter: Payer: Self-pay | Admitting: Internal Medicine

## 2020-07-25 ENCOUNTER — Ambulatory Visit: Payer: Medicaid Other | Admitting: Internal Medicine

## 2020-07-25 VITALS — BP 116/72 | HR 85 | Ht 61.0 in | Wt 151.5 lb

## 2020-07-25 DIAGNOSIS — E0590 Thyrotoxicosis, unspecified without thyrotoxic crisis or storm: Secondary | ICD-10-CM | POA: Diagnosis not present

## 2020-07-25 LAB — TSH: TSH: 0.53 u[IU]/mL (ref 0.35–4.50)

## 2020-07-28 LAB — T3: T3, Total: 158 ng/dL (ref 76–181)

## 2020-07-28 LAB — T4: T4, Total: 9.6 ug/dL (ref 5.1–11.9)

## 2020-07-28 LAB — TRAB (TSH RECEPTOR BINDING ANTIBODY): TRAB: 20.1 IU/L — ABNORMAL HIGH (ref <=2.00)

## 2020-07-31 ENCOUNTER — Other Ambulatory Visit: Payer: Self-pay

## 2020-07-31 ENCOUNTER — Ambulatory Visit (INDEPENDENT_AMBULATORY_CARE_PROVIDER_SITE_OTHER): Payer: Medicaid Other | Admitting: Obstetrics and Gynecology

## 2020-07-31 VITALS — BP 96/62 | HR 79 | Wt 151.0 lb

## 2020-07-31 DIAGNOSIS — E0590 Thyrotoxicosis, unspecified without thyrotoxic crisis or storm: Secondary | ICD-10-CM

## 2020-07-31 DIAGNOSIS — Z3480 Encounter for supervision of other normal pregnancy, unspecified trimester: Secondary | ICD-10-CM

## 2020-07-31 DIAGNOSIS — Z3A16 16 weeks gestation of pregnancy: Secondary | ICD-10-CM | POA: Insufficient documentation

## 2020-07-31 NOTE — Progress Notes (Signed)
ROB [redacted]w[redacted]d  AFP Due today .  CC: None

## 2020-07-31 NOTE — Progress Notes (Signed)
   PRENATAL VISIT NOTE  Subjective:  Ashley Sparks is a 25 y.o. G2P1001 at [redacted]w[redacted]d being seen today for ongoing prenatal care.  She is currently monitored for the following issues for this high-risk pregnancy and has Lactose malabsorption; Subclinical hyperthyroidism; PAC (premature atrial contraction); PVC (premature ventricular contraction); Iron deficiency anemia secondary to blood loss (chronic); Anemia complicating pregnancy in third trimester; Supervision of normal pregnancy, antepartum; and [redacted] weeks gestation of pregnancy on their problem list.  Patient doing well with no acute concerns today. She reports no complaints.  Contractions: Irritability. Vag. Bleeding: None.  Movement: Present. Denies leaking of fluid.   The following portions of the patient's history were reviewed and updated as appropriate: allergies, current medications, past family history, past medical history, past social history, past surgical history and problem list. Problem list updated.  Objective:   Vitals:   07/31/20 1541  BP: 96/62  Pulse: 79  Weight: 151 lb (68.5 kg)    Fetal Status: Fetal Heart Rate (bpm): 161 Fundal Height: 16 cm Movement: Present     General:  Alert, oriented and cooperative. Patient is in no acute distress.  Skin: Skin is warm and dry. No rash noted.   Cardiovascular: Normal heart rate noted  Respiratory: Normal respiratory effort, no problems with respiration noted  Abdomen: Soft, gravid, appropriate for gestational age.  Pain/Pressure: Absent     Pelvic: Cervical exam deferred        Extremities: Normal range of motion.  Edema: None  Mental Status:  Normal mood and affect. Normal behavior. Normal judgment and thought content.   Assessment and Plan:  Pregnancy: G2P1001 at [redacted]w[redacted]d  1. Supervision of other normal pregnancy, antepartum Reviewed chart and pt has seen cardiology and endocrinology.  Per pt she is about to start 7 day monitor for cardiology, but her EKG was normal.    Endocrinology is follow ing the patient closely and feels she is euthyroid.  There is no recommendation for PTU or methimazole. - AFP, Serum, Open Spina Bifida  2. [redacted] weeks gestation of pregnancy   3. Subclinical hyperthyroidism Expectant management per endocrinology  Preterm labor symptoms and general obstetric precautions including but not limited to vaginal bleeding, contractions, leaking of fluid and fetal movement were reviewed in detail with the patient.  Please refer to After Visit Summary for other counseling recommendations.   Return in about 4 weeks (around 08/28/2020) for ROB, in person.   Mariel Aloe, MD Faculty Attending Center for [facility] [facility]

## 2020-08-02 LAB — AFP, SERUM, OPEN SPINA BIFIDA
AFP MoM: 1.87
AFP Value: 69 ng/mL
Gest. Age on Collection Date: 16 weeks
Maternal Age At EDD: 22.9 yr
OSBR Risk 1 IN: 2166
Test Results:: NEGATIVE
Weight: 151 [lb_av]

## 2020-08-22 ENCOUNTER — Other Ambulatory Visit: Payer: Self-pay

## 2020-08-22 ENCOUNTER — Other Ambulatory Visit (INDEPENDENT_AMBULATORY_CARE_PROVIDER_SITE_OTHER): Payer: Medicaid Other

## 2020-08-22 ENCOUNTER — Ambulatory Visit: Payer: Medicaid Other | Attending: Women's Health

## 2020-08-22 DIAGNOSIS — Z3480 Encounter for supervision of other normal pregnancy, unspecified trimester: Secondary | ICD-10-CM | POA: Diagnosis not present

## 2020-08-22 DIAGNOSIS — E0590 Thyrotoxicosis, unspecified without thyrotoxic crisis or storm: Secondary | ICD-10-CM | POA: Diagnosis not present

## 2020-08-22 LAB — TSH: TSH: 0.76 u[IU]/mL (ref 0.35–4.50)

## 2020-08-23 LAB — T4: T4, Total: 11.9 ug/dL (ref 5.1–11.9)

## 2020-08-26 ENCOUNTER — Other Ambulatory Visit: Payer: Self-pay | Admitting: *Deleted

## 2020-08-26 DIAGNOSIS — Z362 Encounter for other antenatal screening follow-up: Secondary | ICD-10-CM

## 2020-08-28 ENCOUNTER — Other Ambulatory Visit: Payer: Self-pay

## 2020-08-28 ENCOUNTER — Ambulatory Visit (INDEPENDENT_AMBULATORY_CARE_PROVIDER_SITE_OTHER): Payer: Medicaid Other

## 2020-08-28 VITALS — BP 103/66 | HR 80 | Wt 154.0 lb

## 2020-08-28 DIAGNOSIS — E0590 Thyrotoxicosis, unspecified without thyrotoxic crisis or storm: Secondary | ICD-10-CM

## 2020-08-28 DIAGNOSIS — Z3480 Encounter for supervision of other normal pregnancy, unspecified trimester: Secondary | ICD-10-CM

## 2020-08-28 DIAGNOSIS — Z3A20 20 weeks gestation of pregnancy: Secondary | ICD-10-CM

## 2020-08-28 NOTE — Progress Notes (Signed)
   LOW-RISK PREGNANCY OFFICE VISIT  Patient name: Ashley Sparks MRN [id number]  Date of birth: [birth year] Chief Complaint:   Routine Prenatal Visit  Subjective:   [name] is a 25 y.o. G55P1001 female at [redacted]w[redacted]d with an Estimated Date of Delivery: 01/14/21 being seen today for ongoing management of a low-risk pregnancy aeb has Lactose malabsorption; Subclinical hyperthyroidism; PAC (premature atrial contraction); PVC (premature ventricular contraction); Iron deficiency anemia secondary to blood loss (chronic); Anemia complicating pregnancy in third trimester; and Supervision of normal pregnancy, antepartum on their problem list.  Patient presents today with no complaints.  Patient endorses fetal movement. Patient denies abdominal cramping or contractions.  Patient denies vaginal concerns including abnormal discharge, leaking of fluid, and bleeding.  Contractions: Not present. Vag. Bleeding: None.  Movement: Present.  Reviewed past medical,surgical, social, obstetrical and family history as well as problem list, medications and allergies.  Objective   Vitals:   08/28/20 1551  BP: 103/66  Pulse: 80  Weight: 154 lb (69.9 kg)  Body mass index is 29.1 kg/m.  Total Weight Gain:18 lb (8.165 kg)         Physical Examination:   General appearance: Well appearing, and in no distress  Mental status: Alert, oriented to person, place, and time  Skin: Warm & dry  Cardiovascular: Normal heart rate noted  Respiratory: Normal respiratory effort, no distress  Abdomen: Soft, gravid, nontender, AGA with Fundal Height: 21 cm  Pelvic: Cervical exam deferred           Extremities: Edema: None  Fetal Status: Fetal Heart Rate (bpm): 150  Movement: Present   No results found for this or any previous visit (from the past 24 hour(s)).  Assessment & Plan:  LoW-risk pregnancy of a 25 y.o., G2P1001 at [redacted]w[redacted]d with an Estimated Date of Delivery: 01/14/21   1. Supervision of other normal pregnancy,  antepartum -Anticipatory guidance for upcoming appts. -Patient to next appt in 4 weeks for an in-person visit. -Reviewed usage of mychart for questions and concerns.  2. [redacted] weeks gestation of pregnancy -Doing well  3. Subclinical hyperthyroidism -Reviewed labs and informed that they are normal.  -Patient reports follow up appt with endocrinologist is scheduled.     Meds: No orders of the defined types were placed in this encounter.  Labs/procedures today:  Lab Orders  No laboratory test(s) ordered today     Reviewed: Preterm labor symptoms and general obstetric precautions including but not limited to vaginal bleeding, contractions, leaking of fluid and fetal movement were reviewed in detail with the patient.  All questions were answered.  Follow-up: Return in about 4 weeks (around 09/25/2020) for LROB.  No orders of the defined types were placed in this encounter.  [name] MSN, CNM 08/28/2020

## 2020-08-28 NOTE — Progress Notes (Signed)
ROB 20w  Needs to discuss labs from endocrinologist   CC: None

## 2020-09-19 ENCOUNTER — Other Ambulatory Visit: Payer: Self-pay

## 2020-09-19 ENCOUNTER — Ambulatory Visit: Payer: Medicaid Other | Attending: Obstetrics and Gynecology

## 2020-09-19 DIAGNOSIS — Z362 Encounter for other antenatal screening follow-up: Secondary | ICD-10-CM | POA: Diagnosis present

## 2020-09-19 NOTE — Progress Notes (Signed)
Name: Ashley Sparks  MRN/ DOB: [id number], [birth year]    Age/ Sex: 25 y.o., female     PCP: Patient, No Pcp Per (Inactive)   Reason for Endocrinology Evaluation: Subclinical hyperthyriodism     Initial Endocrinology Clinic Visit: 07/25/2020    PATIENT IDENTIFIER: Ashley Sparks is a 25 y.o., female with unremarkable  past medical history . She has followed with [place] [facility] [facility] since 07/25/2020 for consultative assistance with management of her Subclinical hyperthyroidism.   HISTORICAL SUMMARY:  She was been noted with low TSH intermittently since 2018, with the lates on 06/06/2020 at 0.015 uIU/mL but with normal T4 at 9.2 ug/dL.   On her initial visit to our clinic she was [redacted] weeks pregnant. TR AB levels elevated at 20.1 IU/L  Maternal aunt with thyroidectomy SUBJECTIVE:   Today (09/20/2020):  Ashley Sparks is here for subclinical hyperthyroid.  She is currently at 23.3 weeks of gestation  She has been noted with weight appropriate weight gain  Denies diarrhea or loose stools  Denies palpitations but has tightness that she attributed to allergies  No local neck symptoms Has burning and itching of the eyes that she attributes to allergies  She is having a girl "[place] "   EDD 01/14/2021      HISTORY:  Past Medical History:  Past Medical History:  Diagnosis Date  . Constipation   . Depression   . Gluten intolerance   . Hypothyroid   . Keloid   . Lactose intolerance   . PAC (premature atrial contraction) 08/24/2016  . Palpitations 07/01/2016  . PVC (premature ventricular contraction) 08/24/2016  . Seasonal allergies   . Shortness of breath 07/01/2016   typically during allergy season  . UTI (urinary tract infection)     Past Surgical History: No past surgical history on file.  Social History:  reports that she has never smoked. She has never used smokeless tobacco. She reports previous drug use. She reports that she does not drink alcohol. Family  History:  Family History  Adopted: Yes  Problem Relation Age of Onset  . Diabetes Mother   . Seizures Mother   . Hyperthyroidism Maternal Aunt   . Celiac disease Neg Hx   . Cholelithiasis Neg Hx   . Ulcers Neg Hx      HOME MEDICATIONS: Allergies as of 09/20/2020      Reactions   Lactose Intolerance (gi) Diarrhea, Other (See Comments)   Gi upset   Gluten Meal Hives, Diarrhea, Other (See Comments)   And abdominal pain   Macrobid [nitrofurantoin Monohyd Macro] Hives, Rash      Medication List       Accurate as of Sep 20, 2020 11:06 AM. If you have any questions, ask your nurse or doctor.        ONE A DAY PRENATAL PO Take by mouth.         OBJECTIVE:   PHYSICAL EXAM: VS: BP 100/68   Pulse 88   Ht 5\' 1"  (1.549 m)   Wt 158 lb 6 oz (71.8 kg)   LMP 03/16/2020   SpO2 99%   BMI 29.92 kg/m    EXAM: General: Pt appears well and is in NAD  Eyes: External eye exam normal without stare, lid lag or exophthalmos.  EOM intact.    Neck: General: Supple without adenopathy. Thyroid: Thyroid size normal.  No goiter or nodules appreciated. No thyroid bruit.  Lungs: Clear with good BS bilat with no rales, rhonchi, or wheezes  Heart: Auscultation: RRR.  Abdomen: Gravid uterus  Extremities:  BL LE: No pretibial edema normal ROM and strength.  Mental Status: Judgment, insight: Intact Orientation: Oriented to time, place, and person Memory: Intact for recent and remote events Mood and affect: No depression, anxiety, or agitation     DATA REVIEWED: Results for Ashley Sparks, Ashley Sparks (MRN [id number]) as of 09/23/2020 07:41  Ref. Range 09/20/2020 11:24  TSH Latest Ref Range: 0.35 - 4.50 uIU/mL 0.71  Triiodothyronine (T3) Latest Ref Range: 76 - 181 ng/dL 675  Thyroxine (T4) Latest Ref Range: 5.1 - 11.9 mcg/dL 91.6    Results for Ashley Sparks, Ashley Sparks (MRN [id number]) as of 09/19/2020 14:17  Ref. Range 07/25/2020 12:04  TRAB Latest Ref Range: <=2.00 IU/L 20.10 (H)    ASSESSMENT / PLAN /  RECOMMENDATIONS:   1. 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  Graves' disease:  -No extrathyroidal manifestations of Graves' disease    F/U in 8 weeks    Signed electronically by: Lyndle Herrlich, MD  [facility] Endocrinology  [facility] [address] [place] [place], [place] [address] Phone: [phone] FAX: [phone]      CC: Patient, No Pcp Per (Inactive) No address on file Phone: None  Fax: None   Return to Endocrinology clinic as below: Future Appointments  Date Time Provider Department Center  09/20/2020 11:10 AM Larrell Rapozo, Konrad Dolores, MD [facility] None  09/25/2020  3:40 PM Nugent, Odie Sera, NP [facility] None  10/18/2020  3:30 PM [facility] NURSE [facility]  10/18/2020  3:45 PM [facility] US1 [facility]

## 2020-09-20 ENCOUNTER — Encounter: Payer: Self-pay | Admitting: Internal Medicine

## 2020-09-20 ENCOUNTER — Other Ambulatory Visit: Payer: Self-pay | Admitting: *Deleted

## 2020-09-20 ENCOUNTER — Ambulatory Visit: Payer: Medicaid Other | Admitting: Internal Medicine

## 2020-09-20 VITALS — BP 100/68 | HR 88 | Ht 61.0 in | Wt 158.4 lb

## 2020-09-20 DIAGNOSIS — E0590 Thyrotoxicosis, unspecified without thyrotoxic crisis or storm: Secondary | ICD-10-CM | POA: Diagnosis not present

## 2020-09-20 DIAGNOSIS — E0500 Thyrotoxicosis with diffuse goiter without thyrotoxic crisis or storm: Secondary | ICD-10-CM | POA: Diagnosis not present

## 2020-09-20 LAB — TSH: TSH: 0.71 u[IU]/mL (ref 0.35–4.50)

## 2020-09-21 LAB — T4: T4, Total: 11 ug/dL (ref 5.1–11.9)

## 2020-09-21 LAB — T3: T3, Total: 181 ng/dL (ref 76–181)

## 2020-09-25 ENCOUNTER — Other Ambulatory Visit: Payer: Self-pay

## 2020-09-25 ENCOUNTER — Encounter: Payer: Self-pay | Admitting: Women's Health

## 2020-09-25 ENCOUNTER — Ambulatory Visit (INDEPENDENT_AMBULATORY_CARE_PROVIDER_SITE_OTHER): Payer: Medicaid Other | Admitting: Women's Health

## 2020-09-25 VITALS — BP 105/62 | HR 77 | Wt 158.0 lb

## 2020-09-25 DIAGNOSIS — E0500 Thyrotoxicosis with diffuse goiter without thyrotoxic crisis or storm: Secondary | ICD-10-CM

## 2020-09-25 DIAGNOSIS — E0590 Thyrotoxicosis, unspecified without thyrotoxic crisis or storm: Secondary | ICD-10-CM

## 2020-09-25 DIAGNOSIS — I491 Atrial premature depolarization: Secondary | ICD-10-CM

## 2020-09-25 DIAGNOSIS — I493 Ventricular premature depolarization: Secondary | ICD-10-CM

## 2020-09-25 DIAGNOSIS — O99013 Anemia complicating pregnancy, third trimester: Secondary | ICD-10-CM

## 2020-09-25 DIAGNOSIS — Z3A24 24 weeks gestation of pregnancy: Secondary | ICD-10-CM

## 2020-09-25 DIAGNOSIS — Z3480 Encounter for supervision of other normal pregnancy, unspecified trimester: Secondary | ICD-10-CM

## 2020-09-25 MED ORDER — PROMETHAZINE HCL 25 MG PO TABS: 25.0000 mg | ORAL_TABLET | Freq: Four times a day (QID) | ORAL | 1 refills | Status: DC | PRN

## 2020-09-25 NOTE — Progress Notes (Signed)
Subjective:  Ashley Sparks is a 25 y.o. G2P1001 at [redacted]w[redacted]d being seen today for ongoing prenatal care.  She is currently monitored for the following issues for this high-risk pregnancy and has Lactose malabsorption; Subclinical hyperthyroidism; PAC (premature atrial contraction); PVC (premature ventricular contraction); Iron deficiency anemia secondary to blood loss (chronic); Anemia complicating pregnancy in third trimester; Supervision of normal pregnancy, antepartum; and Graves disease on their problem list.  Patient reports no complaints.  Contractions: Irritability. Vag. Bleeding: None.  Movement: Present. Denies leaking of fluid.   The following portions of the patient's history were reviewed and updated as appropriate: allergies, current medications, past family history, past medical history, past social history, past surgical history and problem list. Problem list updated.  Objective:   Vitals:   09/25/20 1555  BP: 105/62  Pulse: 77  Weight: 158 lb (71.7 kg)    Fetal Status: Fetal Heart Rate (bpm): 140   Movement: Present     General:  Alert, oriented and cooperative. Patient is in no acute distress.  Skin: Skin is warm and dry. No rash noted.   Cardiovascular: Normal heart rate noted  Respiratory: Normal respiratory effort, no problems with respiration noted  Abdomen: Soft, gravid, appropriate for gestational age. Pain/Pressure: Present     Pelvic: Vag. Bleeding: None     Cervical exam deferred        Extremities: Normal range of motion.  Edema: None  Mental Status: Normal mood and affect. Normal behavior. Normal judgment and thought content.   Urinalysis:      Assessment and Plan:  Pregnancy: G2P1001 at [redacted]w[redacted]d  1. Supervision of other normal pregnancy, antepartum -GTT/labs next visit -pt requests refill of phenergan as she states nausea without vomiting has returned and phenergan is the only thing that takes it all away. Pt denies other symptoms, phenergan refilled.  2.  Graves disease -last visit with endo 09/20/2020  3. PVC (premature ventricular contraction) - AMB Referral to Cardio Obstetrics  4. PAC (premature atrial contraction) - AMB Referral to Cardio Obstetrics  5. Subclinical hyperthyroidism -last visit with endo 09/20/2020  6. [redacted] weeks gestation of pregnancy   Preterm labor symptoms and general obstetric precautions including but not limited to vaginal bleeding, contractions, leaking of fluid and fetal movement were reviewed in detail with the patient. I discussed the assessment and treatment plan with the patient. The patient was provided an opportunity to ask questions and all were answered. The patient agreed with the plan and demonstrated an understanding of the instructions. The patient was advised to call back or seek an in-person office evaluation/go to [facility] at [facility] for any urgent or concerning symptoms. Please refer to After Visit Summary for other counseling recommendations.  Return in about 3 weeks (around 10/16/2020) for in-person HOB/APP OK/GTT/labs, needs appt with cardio OB.   Nature Vogelsang, Odie Sera, NP

## 2020-09-25 NOTE — Patient Instructions (Addendum)
Maternity Assessment Unit (MAU)  The Maternity Assessment Unit (MAU) is located at the [facility] and [place] [facility]. The address is: [address], [place], [place], [place] [address]. Please see map below for additional directions.    The Maternity Assessment Unit is designed to help you during your pregnancy, and for up to 6 weeks after delivery, with any pregnancy- or postpartum-related emergencies, if you think you are in labor, or if your water has broken. For example, if you experience nausea and vomiting, vaginal bleeding, severe abdominal or pelvic pain, elevated blood pressure or other problems related to your pregnancy or postpartum time, please come to the Maternity Assessment Unit for assistance.        Preterm Labor The normal length of a pregnancy is 39-41 weeks. Preterm labor is when labor starts before 37 completed weeks of pregnancy. Babies who are born prematurely and survive may not be fully developed and may be at an increased risk for long-term problems such as cerebral palsy, developmental delays, and vision and hearing problems. Babies who are born too early may have problems soon after birth. Problems may include regulating blood sugar, body temperature, heart rate, and breathing rate. These babies often have trouble with feeding. The risk of having problems is highest for babies who are born before 44 weeks of pregnancy. What are the causes? The exact cause of this condition is not known. What increases the risk? You are more likely to have preterm labor if you have certain risk factors that relate to your medical history, problems with present and past pregnancies, and lifestyle factors. Medical history  You have abnormalities of the uterus, including a short cervix.  You have STIs (sexually transmitted infections), or other infections of the urinary tract and the vagina.  You have chronic illnesses, such as blood clotting problems,  diabetes, or high blood pressure.  You are overweight or underweight. Present and past pregnancies  You have had preterm labor before.  You are pregnant with twins or other multiples.  You have been diagnosed with a condition in which the placenta covers your cervix (placenta previa).  You waited less than 6 months between giving birth and becoming pregnant again.  Your unborn baby has some abnormalities.  You have vaginal bleeding during pregnancy.  You became pregnant through in vitro fertilization (IVF). Lifestyle and environmental factors  You use tobacco products.  You drink alcohol.  You use street drugs.  You have stress and no social support.  You experience domestic violence.  You are exposed to certain chemicals or environmental pollutants. Other factors  You are younger than age 110 or older than age 19. What are the signs or symptoms? Symptoms of this condition include:  Cramps similar to those that can happen during a menstrual period. The cramps may happen with diarrhea.  Pain in the abdomen or lower back.  Regular contractions that may feel like tightening of the abdomen.  A feeling of increased pressure in the pelvis.  Increased watery or bloody mucus discharge from the vagina.  Water breaking (ruptured amniotic sac). How is this diagnosed? This condition is diagnosed based on:  Your medical history and a physical exam.  A pelvic exam.  An ultrasound.  Monitoring your uterus for contractions.  Other tests, including: ? A swab of the cervix to check for a chemical called fetal fibronectin. ? Urine tests. How is this treated? Treatment for this condition depends on the length of your pregnancy, your  condition, and the health of your baby. Treatment may include:  Taking medicines, such as: ? Hormone medicines. These may be given early in pregnancy to help support the pregnancy. ? Medicines to stop contractions. ? Medicines to help mature  the baby's lungs. These may be prescribed if the risk of delivery is high. ? Medicines to prevent your baby from developing cerebral palsy.  Bed rest. If the labor happens before 34 weeks of pregnancy, you may need to stay in the hospital.  Delivery of the baby. Follow these instructions at home:  Do not use any products that contain nicotine or tobacco, such as cigarettes, e-cigarettes, and chewing tobacco. If you need help quitting, ask your health care provider.  Do not drink alcohol.  Take over-the-counter and prescription medicines only as told by your health care provider.  Rest as told by your health care provider.  Return to your normal activities as told by your health care provider. Ask your health care provider what activities are safe for you.  Keep all follow-up visits as told by your health care provider. This is important.   How is this prevented? To increase your chance of having a full-term pregnancy:  Do not use street drugs or medicines that have not been prescribed to you during your pregnancy.  Talk with your health care provider before taking any herbal supplements, even if you have been taking them regularly.  Make sure you gain a healthy amount of weight during your pregnancy.  Watch for infection. If you think that you might have an infection, get it checked right away. Symptoms of infection may include: ? Fever. ? Abnormal vaginal discharge or discharge that smells bad. ? Pain or burning with urination. ? Needing to urinate urgently. ? Frequently urinating or passing small amounts of urine frequently. ? Blood in your urine. ? Urine that smells bad or unusual.  Tell your health care provider if you have had preterm labor before. Contact a health care provider if:  You think you are going into preterm labor.  You have signs or symptoms of preterm labor.  You have symptoms of infection. Get help right away if:  You are having regular, painful  contractions every 5 minutes or less.  Your water breaks. Summary  Preterm labor is labor that starts before you reach 37 weeks of pregnancy.  Delivering your baby early increases your baby's risk of developing lifelong problems.  The exact cause of preterm labor is unknown. However, having an abnormal uterus, an STI (sexually transmitted infection), or vaginal bleeding during pregnancy increases your risk for preterm labor.  Keep all follow-up visits as told by your health care provider. This is important.  Contact a health care provider if you have signs or symptoms of preterm labor. This information is not intended to replace advice given to you by your health care provider. Make sure you discuss any questions you have with your health care provider. Document Revised: 05/30/2019 Document Reviewed: 05/30/2019 Elsevier Patient Education  2021 Elsevier Inc.        Oral Glucose Tolerance Test During Pregnancy Why am I having this test? The oral glucose tolerance test (OGTT) is done to check how your body processes blood sugar (glucose). This is one of several tests used to diagnose diabetes that develops during pregnancy (gestational diabetes mellitus). Gestational diabetes is a short-term form of diabetes that some women develop while they are pregnant. It usually occurs during the second trimester of pregnancy and goes away after  delivery. Testing, or screening, for gestational diabetes usually occurs at weeks 24-28 of pregnancy. You may have the OGTT test after having a 1-hour glucose screening test if the results from that test indicate that you may have gestational diabetes. This test may also be needed if:  You have a history of gestational diabetes.  There is a history of giving birth to very large babies or of losing pregnancies (having stillbirths).  You have signs and symptoms of diabetes, such as: ? Changes in your eyesight. ? Tingling or numbness in your hands or  feet. ? Changes in hunger, thirst, and urination, and these are not explained by your pregnancy. What is being tested? This test measures the amount of glucose in your blood at different times during a period of 3 hours. This shows how well your body can process glucose. What kind of sample is taken? Blood samples are required for this test. They are usually collected by inserting a needle into a blood vessel.   How do I prepare for this test?  For 3 days before your test, eat normally. Have plenty of carbohydrate-rich foods.  Follow instructions from your health care provider about: ? Eating or drinking restrictions on the day of the test. You may be asked not to eat or drink anything other than water (to fast) starting 8-10 hours before the test. ? Changing or stopping your regular medicines. Some medicines may interfere with this test. Tell a health care provider about:  All medicines you are taking, including vitamins, herbs, eye drops, creams, and over-the-counter medicines.  Any blood disorders you have.  Any surgeries you have had.  Any medical conditions you have. What happens during the test? First, your blood glucose will be measured. This is referred to as your fasting blood glucose because you fasted before the test. Then, you will drink a glucose solution that contains a certain amount of glucose. Your blood glucose will be measured again 1, 2, and 3 hours after you drink the solution. This test takes about 3 hours to complete. You will need to stay at the testing location during this time. During the testing period:  Do not eat or drink anything other than the glucose solution.  Do not exercise.  Do not use any products that contain nicotine or tobacco, such as cigarettes, e-cigarettes, and chewing tobacco. These can affect your test results. If you need help quitting, ask your health care provider. The testing procedure may vary among health care providers and  hospitals. How are the results reported? Your results will be reported as milligrams of glucose per deciliter of blood (mg/dL) or millimoles per liter (mmol/L). There is more than one source for screening and diagnosis reference values used to diagnose gestational diabetes. Your health care provider will compare your results to normal values that were established after testing a large group of people (reference values). Reference values may vary among labs and hospitals. For this test (Carpenter-Coustan), reference values are:  Fasting: 95 mg/dL (5.3 mmol/L).  1 hour: 180 mg/dL (24.2 mmol/L).  2 hour: 155 mg/dL (8.6 mmol/L).  3 hour: 140 mg/dL (7.8 mmol/L). What do the results mean? Results below the reference values are considered normal. If two or more of your blood glucose levels are at or above the reference values, you may be diagnosed with gestational diabetes. If only one level is high, your health care provider may suggest repeat testing or other tests to confirm a diagnosis. Talk with your health care provider  about what your results mean. Questions to ask your health care provider Ask your health care provider, or the department that is doing the test:  When will my results be ready?  How will I get my results?  What are my treatment options?  What other tests do I need?  What are my next steps? Summary  The oral glucose tolerance test (OGTT) is one of several tests used to diagnose diabetes that develops during pregnancy (gestational diabetes mellitus). Gestational diabetes is a short-term form of diabetes that some women develop while they are pregnant.  You may have the OGTT test after having a 1-hour glucose screening test if the results from that test show that you may have gestational diabetes. You may also have this test if you have any symptoms or risk factors for this type of diabetes.  Talk with your health care provider about what your results mean. This  information is not intended to replace advice given to you by your health care provider. Make sure you discuss any questions you have with your health care provider. Document Revised: 10/05/2019 Document Reviewed: 10/05/2019 Elsevier Patient Education  2021 Elsevier Inc.  

## 2020-09-26 DIAGNOSIS — E039 Hypothyroidism, unspecified: Secondary | ICD-10-CM | POA: Insufficient documentation

## 2020-09-26 DIAGNOSIS — J302 Other seasonal allergic rhinitis: Secondary | ICD-10-CM | POA: Insufficient documentation

## 2020-09-26 DIAGNOSIS — K9041 Non-celiac gluten sensitivity: Secondary | ICD-10-CM | POA: Insufficient documentation

## 2020-09-26 DIAGNOSIS — F32A Depression, unspecified: Secondary | ICD-10-CM | POA: Insufficient documentation

## 2020-09-26 DIAGNOSIS — K5900 Constipation, unspecified: Secondary | ICD-10-CM | POA: Insufficient documentation

## 2020-09-26 DIAGNOSIS — L910 Hypertrophic scar: Secondary | ICD-10-CM | POA: Insufficient documentation

## 2020-09-26 DIAGNOSIS — E739 Lactose intolerance, unspecified: Secondary | ICD-10-CM | POA: Insufficient documentation

## 2020-09-26 DIAGNOSIS — N390 Urinary tract infection, site not specified: Secondary | ICD-10-CM | POA: Insufficient documentation

## 2020-09-27 ENCOUNTER — Other Ambulatory Visit: Payer: Self-pay

## 2020-09-27 ENCOUNTER — Encounter: Payer: Self-pay | Admitting: Cardiology

## 2020-09-27 ENCOUNTER — Ambulatory Visit (INDEPENDENT_AMBULATORY_CARE_PROVIDER_SITE_OTHER): Payer: Medicaid Other | Admitting: Cardiology

## 2020-09-27 VITALS — BP 102/66 | HR 85 | Ht 60.0 in | Wt 159.3 lb

## 2020-09-27 DIAGNOSIS — I491 Atrial premature depolarization: Secondary | ICD-10-CM | POA: Diagnosis not present

## 2020-09-27 DIAGNOSIS — E669 Obesity, unspecified: Secondary | ICD-10-CM | POA: Diagnosis not present

## 2020-09-27 DIAGNOSIS — I493 Ventricular premature depolarization: Secondary | ICD-10-CM

## 2020-09-27 NOTE — Progress Notes (Signed)
Cardio-Obstetrics Clinic  New Evaluation   This is a 25 year old female G2 P1-0-0-1 who is currently 24 weeks and 3 days pregnant with medical history of premature atrial complex, subclinical hyperthyroidism with recent normal thyroid function pending antibody testing, presents to be evaluated in the cardio obstetrics clinic at the request of her provider Donia Ast, NP.  The patient on chart review was seen on July 19, 2020 by my partner Dr. Stephenie Acres at which time she presented with palpitations, shortness of breath and intermittent chest pain.  During that visit we discussed anxiety and stress as well as subclinical hyperthyroidism that may be contributing to her symptoms.  She was advised to increase her fluid intake and an ambulatory monitor was placed on the patient.    On presentation today she tells me that she wore the ambulatory monitor for couple hours and she had reaction with her skin and she took it off.  But she is very happy that she has not had any symptoms of palpitation they have rather improved since she increase her fluid intake.  Today she is here with her boyfriend.  She denies any chest pain, shortness of breath, palpitations or dizziness.   Prior CV Studies Reviewed: The following studies were reviewed today:  Transthoracic echocardiogram July 17, 2016 Study Conclusions  - Left ventricle: The cavity size was normal. Wall thickness was  normal. Systolic function was normal. The estimated ejection  fraction was in the range of 60% to 65%. Wall motion was normal;  there were no regional wall motion abnormalities. Left  ventricular diastolic function parameters were normal.  - Left atrium: The atrium was normal in size.  - Inferior vena cava: The vessel was normal in size. The  respirophasic diameter changes were in the normal range (>= 50%),  consistent with normal central venous pressure.  Impressions:  - Normal study.   07/07/2016 48 Hour  Holter Monitor Quality: Fair.  Baseline artifact. Predominant rhythm: sinus rhythm Average heart rate: 83 bpm Max heart rate: 179 bpm Min heart rate: 50 bpm Occasional PACs Sinus arrhythmia No arrhythmias noted.   Past Medical History:  Diagnosis Date  . Anemia complicating pregnancy in third trimester 11/16/2016  . Constipation   . Depression   . Gluten intolerance   . Hypothyroid   . Keloid   . Lactose intolerance   . PAC (premature atrial contraction) 08/24/2016  . Palpitations 07/01/2016  . PVC (premature ventricular contraction) 08/24/2016  . Seasonal allergies   . Shortness of breath 07/01/2016   typically during allergy season  . UTI (urinary tract infection)     History reviewed. No pertinent surgical history.    OB History    Gravida  2   Para  1   Term  1   Preterm  0   AB  0   Living  1     SAB  0   IAB  0   Ectopic  0   Multiple  0   Live Births  1               Current Medications: Current Meds  Medication Sig  . Prenatal MV & Min w/FA-DHA (ONE A DAY PRENATAL PO) Take by mouth.  . promethazine (PHENERGAN) 25 MG tablet Take 1 tablet (25 mg total) by mouth every 6 (six) hours as needed for nausea.     Allergies:   Lactose intolerance (gi), Gluten meal, and Macrobid [nitrofurantoin monohyd macro]   Social History  Socioeconomic History  . Marital status: Single    Spouse name: Not on file  . Number of children: 1  . Years of education: Not on file  . Highest education level: Not on file  Occupational History  . Occupation: unemployed  Tobacco Use  . Smoking status: Never Smoker  . Smokeless tobacco: Never Used  Vaping Use  . Vaping Use: Never used  Substance and Sexual Activity  . Alcohol use: No  . Drug use: Not Currently  . Sexual activity: Yes    Partners: Male    Birth control/protection: None    Comment: Pregnant   Other Topics Concern  . Not on file  Social History Narrative   10th grade (online)   Social  Determinants of Health   Financial Resource Strain: Low Risk   . Difficulty of Paying Living Expenses: Not hard at all  Food Insecurity: No Food Insecurity  . Worried About Programme researcher, broadcasting/film/video in the Last Year: Never true  . Ran Out of Food in the Last Year: Never true  Transportation Needs: No Transportation Needs  . Lack of Transportation (Medical): No  . Lack of Transportation (Non-Medical): No  Physical Activity: Inactive  . Days of Exercise per Week: 0 days  . Minutes of Exercise per Session: 0 min  Stress: Not on file  Social Connections: Not on file      Family History  Adopted: Yes  Problem Relation Age of Onset  . Diabetes Mother   . Seizures Mother   . Hyperthyroidism Maternal Aunt   . Celiac disease Neg Hx   . Cholelithiasis Neg Hx   . Ulcers Neg Hx       ROS:   Please see the history of present illness.    Review of Systems  Constitution: Negative for decreased appetite, fever and weight gain.  HENT: Negative for congestion, ear discharge, hoarse voice and sore throat.   Eyes: Negative for discharge, redness, vision loss in right eye and visual halos.  Cardiovascular: Negative for chest pain, dyspnea on exertion, leg swelling, orthopnea and palpitations.  Respiratory: Negative for cough, hemoptysis, shortness of breath and snoring.   Endocrine: Negative for heat intolerance and polyphagia.  Hematologic/Lymphatic: Negative for bleeding problem. Does not bruise/bleed easily.  Skin: Negative for flushing, nail changes, rash and suspicious lesions.  Musculoskeletal: Negative for arthritis, joint pain, muscle cramps, myalgias, neck pain and stiffness.  Gastrointestinal: Negative for abdominal pain, bowel incontinence, diarrhea and excessive appetite.  Genitourinary: Negative for decreased libido, genital sores and incomplete emptying.  Neurological: Negative for brief paralysis, focal weakness, headaches and loss of balance.  Psychiatric/Behavioral: Negative for  altered mental status, depression and suicidal ideas.  Allergic/Immunologic: Negative for HIV exposure and persistent infections.     Labs/EKG Reviewed:    EKG:   EKG was ordered today.  The ekg ordered today demonstrates sinus rhythm, heart rate 85 beats minute, compared to EKG done in March 2021 no significant change.  Recent Labs: 05/19/2020: ALT 19; B Natriuretic Peptide 30.8; BUN 8; Creatinine, Ser 0.60; Potassium 4.1; Sodium 135 06/20/2020: Hemoglobin 13.7; Platelets 184 09/20/2020: TSH 0.71   Recent Lipid Panel No results found for: CHOL, TRIG, HDL, CHOLHDL, LDLCALC, LDLDIRECT  Physical Exam:    VS:  BP 102/66 (BP Location: Left Arm)   Pulse 85   Ht 5' (1.524 m)   Wt 159 lb 4.8 oz (72.3 kg)   LMP 03/16/2020   SpO2 99%   BMI 31.11 kg/m  Wt Readings from Last 3 Encounters:  09/27/20 159 lb 4.8 oz (72.3 kg)  09/25/20 158 lb (71.7 kg)  09/20/20 158 lb 6 oz (71.8 kg)     GEN: Well nourished, well developed in no acute distress HEENT: Normal NECK: No JVD; No carotid bruits LYMPHATICS: No lymphadenopathy CARDIAC: RRR, no murmurs, rubs, gallops RESPIRATORY:  Clear to auscultation without rales, wheezing or rhonchi  ABDOMEN: Soft, non-tender, non-distended MUSCULOSKELETAL:  No edema; No deformity  SKIN: Warm and dry NEUROLOGIC:  Alert and oriented x 3 PSYCHIATRIC:  Normal affect    Risk Assessment/Risk Calculators:     CARPREG II Risk Prediction Index Score:  1.  The patient's risk for a primary cardiac event is 5%.   Modified World Health [facility]) Classification of Maternal CV Risk   WHO Risk Class I (No detectable increased risk of maternal mortality and no or mild increase in morbidity.)         ASSESSMENT & PLAN:    PACs Obesity  The patient is not experiencing prior palpitations.  Unfortunately she was unable to tolerate the ZIO monitor adhesive and could not wear this device.  She denies any further symptoms since she was seen back in  March.  I encouraged the patient to maintain proper hydration.  She is still following with endocrinology right now her TSH is normal.  At  The patient understands the need to lose weight with diet and exercise. We have discussed specific strategies for this during pregnancy.  Dispo: As needed.  I have also educated the patient to use MyChart to contact me if symptoms recur.  Medication Adjustments/Labs and Tests Ordered: Current medicines are reviewed at length with the patient today.  Concerns regarding medicines are outlined above.  Tests Ordered: Orders Placed This Encounter  Procedures  . EKG 12-Lead   Medication Changes: No orders of the defined types were placed in this encounter.

## 2020-09-27 NOTE — Patient Instructions (Signed)
Medication Instructions:  Your physician recommends that you continue on your current medications as directed. Please refer to the Current Medication list given to you today.  *If you need a refill on your cardiac medications before your next appointment, please call your pharmacy*   Lab Work: None If you have labs (blood work) drawn today and your tests are completely normal, you will receive your results only by: Marland Kitchen MyChart Message (if you have MyChart) OR . A paper copy in the mail If you have any lab test that is abnormal or we need to change your treatment, we will call you to review the results.   Testing/Procedures: None   Follow-Up: At [facility], you and your health needs are our priority.  As part of our continuing mission to provide you with exceptional heart care, we have created designated Provider Care Teams.  These Care Teams include your primary Cardiologist (physician) and Advanced Practice Providers (APPs -  Physician Assistants and Nurse Practitioners) who all work together to provide you with the care you need, when you need it.  We recommend signing up for the patient portal called "MyChart".  Sign up information is provided on this After Visit Summary.  MyChart is used to connect with patients for Virtual Visits (Telemedicine).  Patients are able to view lab/test results, encounter notes, upcoming appointments, etc.  Non-urgent messages can be sent to your provider as well.   To learn more about what you can do with MyChart, go to [email or web address].    Your next appointment:   As needed  The format for your next appointment:   In Person  Provider:   [facility]   Other Instructions

## 2020-10-17 ENCOUNTER — Encounter: Payer: Self-pay | Admitting: Obstetrics

## 2020-10-17 ENCOUNTER — Ambulatory Visit (INDEPENDENT_AMBULATORY_CARE_PROVIDER_SITE_OTHER): Payer: Medicaid Other | Admitting: Obstetrics

## 2020-10-17 ENCOUNTER — Other Ambulatory Visit: Payer: Self-pay

## 2020-10-17 ENCOUNTER — Other Ambulatory Visit: Payer: Medicaid Other

## 2020-10-17 VITALS — BP 108/56 | HR 94 | Wt 168.0 lb

## 2020-10-17 DIAGNOSIS — Z23 Encounter for immunization: Secondary | ICD-10-CM

## 2020-10-17 DIAGNOSIS — Z3480 Encounter for supervision of other normal pregnancy, unspecified trimester: Secondary | ICD-10-CM

## 2020-10-17 DIAGNOSIS — E0590 Thyrotoxicosis, unspecified without thyrotoxic crisis or storm: Secondary | ICD-10-CM

## 2020-10-17 NOTE — Progress Notes (Signed)
OB/GTT.  TDAP given in RD, tolerated well.

## 2020-10-17 NOTE — Progress Notes (Signed)
Subjective:  Ashley Sparks is a 25 y.o. G2P1001 at [redacted]w[redacted]d being seen today for ongoing prenatal care.  She is currently monitored for the following issues for this low-risk pregnancy and has Lactose malabsorption; Subclinical hyperthyroidism; PAC (premature atrial contraction); PVC (premature ventricular contraction); Iron deficiency anemia secondary to blood loss (chronic); Anemia complicating pregnancy in third trimester; Supervision of normal pregnancy, antepartum; Graves disease; UTI (urinary tract infection); Shortness of breath; Seasonal allergies; Palpitations; Lactose intolerance; Keloid; Hypothyroid; Gluten intolerance; Depression; Constipation; and Obesity (BMI 30-39.9) on their problem list.  Patient reports no complaints.  Contractions: Irritability. Vag. Bleeding: None.  Movement: Present. Denies leaking of fluid.   The following portions of the patient's history were reviewed and updated as appropriate: allergies, current medications, past family history, past medical history, past social history, past surgical history and problem list. Problem list updated.  Objective:   Vitals:   10/17/20 0859  BP: (!) 108/56  Pulse: 94  Weight: 168 lb (76.2 kg)    Fetal Status: Fetal Heart Rate (bpm): 147   Movement: Present     General:  Alert, oriented and cooperative. Patient is in no acute distress.  Skin: Skin is warm and dry. No rash noted.   Cardiovascular: Normal heart rate noted  Respiratory: Normal respiratory effort, no problems with respiration noted  Abdomen: Soft, gravid, appropriate for gestational age. Pain/Pressure: Absent     Pelvic:  Cervical exam deferred        Extremities: Normal range of motion.  Edema: Trace  Mental Status: Normal mood and affect. Normal behavior. Normal judgment and thought content.   Urinalysis:      Assessment and Plan:  Pregnancy: G2P1001 at [redacted]w[redacted]d  1. Supervision of other normal pregnancy, antepartum Rx: - Glucose Tolerance, 2 Hours w/1  Hour - RPR - CBC - HIV antibody (with reflex) - Tdap vaccine greater than or equal to 7yo IM  2. Subclinical hyperthyroidism - clinically stable   Preterm labor symptoms and general obstetric precautions including but not limited to vaginal bleeding, contractions, leaking of fluid and fetal movement were reviewed in detail with the patient. Please refer to After Visit Summary for other counseling recommendations.   Return in about 2 weeks (around 10/31/2020) for ROB.   Brock Bad, MD  10/17/20

## 2020-10-18 ENCOUNTER — Ambulatory Visit: Payer: Medicaid Other | Attending: Maternal & Fetal Medicine

## 2020-10-18 ENCOUNTER — Ambulatory Visit: Payer: Medicaid Other | Admitting: *Deleted

## 2020-10-18 ENCOUNTER — Encounter: Payer: Self-pay | Admitting: *Deleted

## 2020-10-18 VITALS — BP 118/62 | HR 101

## 2020-10-18 DIAGNOSIS — E0590 Thyrotoxicosis, unspecified without thyrotoxic crisis or storm: Secondary | ICD-10-CM

## 2020-10-18 DIAGNOSIS — O99282 Endocrine, nutritional and metabolic diseases complicating pregnancy, second trimester: Secondary | ICD-10-CM | POA: Diagnosis not present

## 2020-10-18 DIAGNOSIS — Z362 Encounter for other antenatal screening follow-up: Secondary | ICD-10-CM | POA: Diagnosis not present

## 2020-10-18 DIAGNOSIS — Z3A27 27 weeks gestation of pregnancy: Secondary | ICD-10-CM

## 2020-10-18 LAB — CBC
Hematocrit: 31.2 % — ABNORMAL LOW (ref 34.0–46.6)
Hemoglobin: 10.1 g/dL — ABNORMAL LOW (ref 11.1–15.9)
MCH: 29.1 pg (ref 26.6–33.0)
MCHC: 32.4 g/dL (ref 31.5–35.7)
MCV: 90 fL (ref 79–97)
Platelets: 174 10*3/uL (ref 150–450)
RBC: 3.47 x10E6/uL — ABNORMAL LOW (ref 3.77–5.28)
RDW: 11.8 % (ref 11.7–15.4)
WBC: 13.3 10*3/uL — ABNORMAL HIGH (ref 3.4–10.8)

## 2020-10-18 LAB — GLUCOSE TOLERANCE, 2 HOURS W/ 1HR
Glucose, 1 hour: 84 mg/dL (ref 65–179)
Glucose, 2 hour: 78 mg/dL (ref 65–152)
Glucose, Fasting: 80 mg/dL (ref 65–91)

## 2020-10-18 LAB — HIV ANTIBODY (ROUTINE TESTING W REFLEX): HIV Screen 4th Generation wRfx: NONREACTIVE

## 2020-10-18 LAB — RPR: RPR Ser Ql: NONREACTIVE

## 2020-10-19 ENCOUNTER — Other Ambulatory Visit: Payer: Self-pay | Admitting: Obstetrics

## 2020-10-19 DIAGNOSIS — O99013 Anemia complicating pregnancy, third trimester: Secondary | ICD-10-CM

## 2020-10-19 MED ORDER — FERROUS SULFATE 325 (65 FE) MG PO TABS: 325.0000 mg | ORAL_TABLET | ORAL | 5 refills | Status: DC

## 2020-10-21 ENCOUNTER — Telehealth: Payer: Self-pay

## 2020-10-21 NOTE — Telephone Encounter (Signed)
-----   Message from Brock Bad, MD sent at 10/19/2020 11:33 AM EDT ----- FeSO4 Rx for anemia.

## 2020-10-21 NOTE — Telephone Encounter (Signed)
Patient reviewed test results online. 

## 2020-10-31 ENCOUNTER — Ambulatory Visit (INDEPENDENT_AMBULATORY_CARE_PROVIDER_SITE_OTHER): Payer: Medicaid Other

## 2020-10-31 ENCOUNTER — Other Ambulatory Visit: Payer: Self-pay

## 2020-10-31 VITALS — BP 107/61 | HR 89 | Wt 169.0 lb

## 2020-10-31 DIAGNOSIS — Z3480 Encounter for supervision of other normal pregnancy, unspecified trimester: Secondary | ICD-10-CM

## 2020-10-31 DIAGNOSIS — O26899 Other specified pregnancy related conditions, unspecified trimester: Secondary | ICD-10-CM

## 2020-10-31 DIAGNOSIS — R1030 Lower abdominal pain, unspecified: Secondary | ICD-10-CM

## 2020-10-31 DIAGNOSIS — Z3A29 29 weeks gestation of pregnancy: Secondary | ICD-10-CM

## 2020-10-31 NOTE — Progress Notes (Signed)
   LOW-RISK PREGNANCY OFFICE VISIT  Patient name: Ashley Sparks MRN [id number]  Date of birth: June 07, 1995 Chief Complaint:   Routine Prenatal Visit  Subjective:   Ashley Sparks is a 25 y.o. G52P1001 female at [redacted]w[redacted]d with an Estimated Date of Delivery: 01/14/21 being seen today for ongoing management of a low-risk pregnancy aeb has Lactose malabsorption; Subclinical hyperthyroidism; PAC (premature atrial contraction); PVC (premature ventricular contraction); Iron deficiency anemia secondary to blood loss (chronic); Anemia complicating pregnancy in third trimester; Supervision of normal pregnancy, antepartum; Graves disease; UTI (urinary tract infection); Shortness of breath; Seasonal allergies; Palpitations; Lactose intolerance; Keloid; Hypothyroid; Gluten intolerance; Depression; Constipation; and Obesity (BMI 30-39.9) on their problem list.  Patient presents today with  abdominal cramping .  Patient endorses fetal movement. Patient denies contractions and states her cramping is primarily on her right side.  Patient states her pain is likely sciatica as she experienced this with her previous pregnancy.  However, patient is tearful and reports increased discomfort with sitting.   Patient denies vaginal concerns including abnormal discharge, leaking of fluid, and bleeding.  Contractions: Irritability. Vag. Bleeding: None.  Movement: Present.  Reviewed past medical,surgical, social, obstetrical and family history as well as problem list, medications and allergies.  Objective   Vitals:   10/31/20 1553  BP: 107/61  Pulse: 89  Weight: 169 lb (76.7 kg)  Body mass index is 33.01 kg/m.  Total Weight Gain:33 lb (15 kg)         Physical Examination:   General appearance: Well appearing, and in no distress  Mental status: Alert, oriented to person, place, and time  Skin: Warm & dry  Cardiovascular: Normal heart rate noted  Respiratory: Normal respiratory effort, no distress  Abdomen: Soft, gravid,  nontender, AGA with Fundal Height: 29 cm  Pelvic: Cervical exam deferred           Extremities:    Fetal Status: Fetal Heart Rate (bpm): 140  Movement: Present   No results found for this or any previous visit (from the past 24 hour(s)).  Assessment & Plan:  Low-risk pregnancy of a 25 y.o., G2P1001 at [redacted]w[redacted]d with an Estimated Date of Delivery: 01/14/21   1. Supervision of other normal pregnancy, antepartum -Anticipatory guidance for upcoming appts. -Patient to schedule next appt in 2-3 weeks for an in-person visit.  2. [redacted] weeks gestation of pregnancy -Reviewed complaint. -FH measurement completed and abdomen palpated.  No ctx appreciated.  Soft RT, No tenderness at fundus. Fetal movement noted.   3. Pregnancy with abdominal cramping of lower quadrant, antepartum -Instructed to go home rest, hydrate, and monitor symptoms. -Reviewed usage of tylenol as necessary. -Instructed to report to MAU if symptoms worsen or if no resolution in 3 hours despite symptoms. -Patient able to ambulate out of office without assistance.    Meds: No orders of the defined types were placed in this encounter.  Labs/procedures today:  Lab Orders  No laboratory test(s) ordered today     Reviewed: Preterm labor symptoms and general obstetric precautions including but not limited to vaginal bleeding, contractions, leaking of fluid and fetal movement were reviewed in detail with the patient.  All questions were answered.  Follow-up: No follow-ups on file.  No orders of the defined types were placed in this encounter.  [name] MSN, CNM 10/31/2020

## 2020-11-25 ENCOUNTER — Ambulatory Visit: Payer: Medicaid Other | Admitting: Internal Medicine

## 2020-11-25 NOTE — Progress Notes (Deleted)
Name: Ashley Sparks  MRN/ DOB: [id number], 16-Apr-1996    Age/ Sex: 25 y.o., female     PCP: Patient, No Pcp Per (Inactive)   Reason for Endocrinology Evaluation: Subclinical hyperthyriodism     Initial Endocrinology Clinic Visit: 07/25/2020    PATIENT IDENTIFIER: Ms. Ashley Sparks is a 25 y.o., female with unremarkable  past medical history . She has followed with [facility] since 07/25/2020 for consultative assistance with management of her Subclinical hyperthyroidism.   HISTORICAL SUMMARY:  She was been noted with low TSH intermittently since 2018, with the lates on 06/06/2020 at 0.015 uIU/mL but with normal T4 at 9.2 ug/dL.   On her initial visit to our clinic she was [redacted] weeks pregnant. TRAB levels elevated at 20.1 IU/L  Maternal aunt with thyroidectomy SUBJECTIVE:   Today (11/25/2020):  Ashley Sparks is here for subclinical hyperthyroid.  She is currently at 32.6 weeks of gestation  She has been noted with weight appropriate weight gain  Denies diarrhea or loose stools  Denies palpitations but has tightness that she attributed to allergies  No local neck symptoms Has burning and itching of the eyes that she attributes to allergies  She is having a girl "Ashley Sparks "   EDD 01/14/2021      HISTORY:  Past Medical History:  Past Medical History:  Diagnosis Date   Anemia complicating pregnancy in third trimester 11/16/2016   Constipation    Depression    Gluten intolerance    Hypothyroid    Keloid    Lactose intolerance    PAC (premature atrial contraction) 08/24/2016   Palpitations 07/01/2016   PVC (premature ventricular contraction) 08/24/2016   Seasonal allergies    Shortness of breath 07/01/2016   typically during allergy season   UTI (urinary tract infection)    Past Surgical History: No past surgical history on file. Social History:  reports that she has never smoked. She has never used smokeless tobacco. She reports previous drug use. She reports  that she does not drink alcohol. Family History:  Family History  Adopted: Yes  Problem Relation Age of Onset   Diabetes Mother    Seizures Mother    Hyperthyroidism Maternal Aunt    Celiac disease Neg Hx    Cholelithiasis Neg Hx    Ulcers Neg Hx      HOME MEDICATIONS: Allergies as of 11/25/2020       Reactions   Lactose Intolerance (gi) Diarrhea, Other (See Comments)   Gi upset   Gluten Meal Hives, Diarrhea, Other (See Comments)   And abdominal pain   Macrobid [nitrofurantoin Monohyd Macro] Hives, Rash        Medication List        Accurate as of November 25, 2020 10:11 AM. If you have any questions, ask your nurse or doctor.          ferrous sulfate 325 (65 FE) MG tablet Take 1 tablet (325 mg total) by mouth every other day.   ONE A DAY PRENATAL PO Take by mouth.   promethazine 25 MG tablet Commonly known as: PHENERGAN Take 1 tablet (25 mg total) by mouth every 6 (six) hours as needed for nausea.          OBJECTIVE:   PHYSICAL EXAM: VS: LMP 03/16/2020    EXAM: General: Pt appears well and is in NAD  Eyes: External eye exam normal without stare, lid lag or exophthalmos.  EOM intact.    Neck: General: Supple without adenopathy. Thyroid:  Thyroid size normal.  No goiter or nodules appreciated. No thyroid bruit.  Lungs: Clear with good BS bilat with no rales, rhonchi, or wheezes  Heart: Auscultation: RRR.  Abdomen: Gravid uterus  Extremities:  BL LE: No pretibial edema normal ROM and strength.  Mental Status: Judgment, insight: Intact Orientation: Oriented to time, place, and person Memory: Intact for recent and remote events Mood and affect: No depression, anxiety, or agitation     DATA REVIEWED: Results for ZEMA, LIZARDO (MRN [id number]) as of 09/23/2020 07:41  Ref. Range 09/20/2020 11:24  TSH Latest Ref Range: 0.35 - 4.50 uIU/mL 0.71  Triiodothyronine (T3) Latest Ref Range: 76 - 181 ng/dL 765  Thyroxine (T4) Latest Ref Range: 5.1 - 11.9 mcg/dL  46.5    Results for EVAMAE, ROWEN (MRN [id number]) as of 09/19/2020 14:17  Ref. 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  Graves' disease:  -No extrathyroidal manifestations of Graves' disease    F/U in 8 weeks    Signed electronically by: Lyndle Herrlich, MD  [facility]  [facility] [address] [place] [place], [place] [address] Phone: [phone] FAX: [phone]      CC: Patient, No Pcp Per (Inactive) No address on file Phone: None  Fax: None   Return to Endocrinology clinic as below: Future Appointments  Date Time Provider Department Center  11/25/2020  1:00 PM Lewellyn Fultz, Konrad Dolores, MD [facility] None

## 2020-12-02 ENCOUNTER — Encounter: Payer: Self-pay | Admitting: Obstetrics

## 2020-12-02 ENCOUNTER — Other Ambulatory Visit: Payer: Self-pay

## 2020-12-02 ENCOUNTER — Ambulatory Visit (INDEPENDENT_AMBULATORY_CARE_PROVIDER_SITE_OTHER): Payer: Medicaid Other | Admitting: Obstetrics

## 2020-12-02 VITALS — BP 106/61 | HR 94 | Wt 173.8 lb

## 2020-12-02 DIAGNOSIS — O99013 Anemia complicating pregnancy, third trimester: Secondary | ICD-10-CM

## 2020-12-02 DIAGNOSIS — E0590 Thyrotoxicosis, unspecified without thyrotoxic crisis or storm: Secondary | ICD-10-CM

## 2020-12-02 DIAGNOSIS — Z3480 Encounter for supervision of other normal pregnancy, unspecified trimester: Secondary | ICD-10-CM

## 2020-12-02 MED ORDER — FERROUS SULFATE 325 (65 FE) MG PO TABS: 325.0000 mg | ORAL_TABLET | ORAL | 5 refills | Status: DC

## 2020-12-02 NOTE — Progress Notes (Signed)
Subjective:  Ashley Sparks is a 25 y.o. G2P1001 at [redacted]w[redacted]d being seen today for ongoing prenatal care.  She is currently monitored for the following issues for this low-risk pregnancy and has Lactose malabsorption; Subclinical hyperthyroidism; PAC (premature atrial contraction); PVC (premature ventricular contraction); Iron deficiency anemia secondary to blood loss (chronic); Anemia complicating pregnancy in third trimester; Supervision of normal pregnancy, antepartum; Graves disease; UTI (urinary tract infection); Shortness of breath; Seasonal allergies; Palpitations; Lactose intolerance; Keloid; Hypothyroid; Gluten intolerance; Depression; Constipation; and Obesity (BMI 30-39.9) on their problem list.  Patient reports backache.  Contractions: Irritability. Vag. Bleeding: None.  Movement: Present. Denies leaking of fluid.   The following portions of the patient's history were reviewed and updated as appropriate: allergies, current medications, past family history, past medical history, past social history, past surgical history and problem list. Problem list updated.  Objective:   Vitals:   12/02/20 1407  BP: 106/61  Pulse: 94  Weight: 173 lb 12.8 oz (78.8 kg)    Fetal Status:     Movement: Present     General:  Alert, oriented and cooperative. Patient is in no acute distress.  Skin: Skin is warm and dry. No rash noted.   Cardiovascular: Normal heart rate noted  Respiratory: Normal respiratory effort, no problems with respiration noted  Abdomen: Soft, gravid, appropriate for gestational age. Pain/Pressure: Present     Pelvic:  Cervical exam deferred        Extremities: Normal range of motion.  Edema: Trace  Mental Status: Normal mood and affect. Normal behavior. Normal judgment and thought content.   Urinalysis:      Assessment and Plan:  Pregnancy: G2P1001 at [redacted]w[redacted]d  1. Supervision of other normal pregnancy, antepartum  2. Subclinical hyperthyroidism - clinically stable.  Thyroid  labs normal.at 23 weeks - thyroid labs repeated today   3. Anemia ( HgB = 10 ) - iron Rx but has not picked it up from pharmacy Rx: - FeSO4 325 mg po every other day    Preterm labor symptoms and general obstetric precautions including but not limited to vaginal bleeding, contractions, leaking of fluid and fetal movement were reviewed in detail with the patient. Please refer to After Visit Summary for other counseling recommendations.   Return in about 2 weeks (around 12/16/2020) for ROB.   Brock Bad, MD  12/02/20

## 2020-12-02 NOTE — Progress Notes (Signed)
Patient presents for ROB. Patient has no concerns today. 

## 2020-12-03 LAB — THYROID PANEL WITH TSH
Free Thyroxine Index: 1.4 (ref 1.2–4.9)
T3 Uptake Ratio: 12 % — ABNORMAL LOW (ref 24–39)
T4, Total: 11.6 ug/dL (ref 4.5–12.0)
TSH: 0.991 u[IU]/mL (ref 0.450–4.500)

## 2020-12-06 ENCOUNTER — Telehealth: Payer: Self-pay

## 2020-12-06 NOTE — Telephone Encounter (Signed)
Return call to pt regarding triage vm Pt c/o braxton hicks contractions And losing mucous plug pt wanted to know was this ok to be at home or go to MAU.  Pt advised can lose mucous  Denies any decreased FM, no vaginal bleeding, no leaking  Pt has on panty liner  Pt advised if pad becomes soaked to report to MAU Monitor ctx's if 3-5 mins apart lasting 1 min for 1 hr , any bleeding, decreased FM to report to MAU Pt voiced understanding.

## 2020-12-16 ENCOUNTER — Ambulatory Visit (INDEPENDENT_AMBULATORY_CARE_PROVIDER_SITE_OTHER): Payer: Medicaid Other | Admitting: Obstetrics & Gynecology

## 2020-12-16 ENCOUNTER — Encounter: Payer: Self-pay | Admitting: Obstetrics & Gynecology

## 2020-12-16 ENCOUNTER — Other Ambulatory Visit: Payer: Self-pay

## 2020-12-16 VITALS — BP 115/74 | HR 84 | Wt 176.5 lb

## 2020-12-16 DIAGNOSIS — O99013 Anemia complicating pregnancy, third trimester: Secondary | ICD-10-CM

## 2020-12-16 DIAGNOSIS — E0500 Thyrotoxicosis with diffuse goiter without thyrotoxic crisis or storm: Secondary | ICD-10-CM

## 2020-12-16 DIAGNOSIS — Z3480 Encounter for supervision of other normal pregnancy, unspecified trimester: Secondary | ICD-10-CM

## 2020-12-16 MED ORDER — FERROUS SULFATE 325 (65 FE) MG PO TABS: 325.0000 mg | ORAL_TABLET | ORAL | 5 refills | Status: DC

## 2020-12-16 NOTE — Progress Notes (Signed)
   PRENATAL VISIT NOTE  Subjective:  Ashley Sparks is a 25 y.o. G2P1001 at [redacted]w[redacted]d being seen today for ongoing prenatal care.  She is currently monitored for the following issues for this high-risk pregnancy and has Lactose malabsorption; Subclinical hyperthyroidism; PAC (premature atrial contraction); PVC (premature ventricular contraction); Iron deficiency anemia secondary to blood loss (chronic); Anemia complicating pregnancy in third trimester; Supervision of normal pregnancy, antepartum; Graves disease; UTI (urinary tract infection); Shortness of breath; Seasonal allergies; Palpitations; Lactose intolerance; Keloid; Hypothyroid; Gluten intolerance; Depression; Constipation; and Obesity (BMI 30-39.9) on their problem list.  Patient reports no complaints.  Contractions: Irritability. Vag. Bleeding: None.  Movement: Present. Denies leaking of fluid.   The following portions of the patient's history were reviewed and updated as appropriate: allergies, current medications, past family history, past medical history, past social history, past surgical history and problem list.   Objective:   Vitals:   12/16/20 1559  BP: 115/74  Pulse: 84  Weight: 176 lb 8 oz (80.1 kg)    Fetal Status: Fetal Heart Rate (bpm): 145   Movement: Present     General:  Alert, oriented and cooperative. Patient is in no acute distress.  Skin: Skin is warm and dry. No rash noted.   Cardiovascular: Normal heart rate noted  Respiratory: Normal respiratory effort, no problems with respiration noted  Abdomen: Soft, gravid, appropriate for gestational age.  Pain/Pressure: Present     Pelvic: Cervical exam deferred        Extremities: Normal range of motion.  Edema: None  Mental Status: Normal mood and affect. Normal behavior. Normal judgment and thought content.   Assessment and Plan:  Pregnancy: G2P1001 at [redacted]w[redacted]d 1. Supervision of other normal pregnancy, antepartum - GBS/GC/CT in one week - Reviewed PTL  precautions  2. Anemia complicating pregnancy in third trimester - Pharmacy didn't fill Rx - Resent Iron every other day  3. Graves disease - Lab rechecked 7/25 and overall stable and normal off medication - TSH 0.991 but free T4 1.4   Preterm labor symptoms and general obstetric precautions including but not limited to vaginal bleeding, contractions, leaking of fluid and fetal movement were reviewed in detail with the patient. Please refer to After Visit Summary for other counseling recommendations.   Return in about 1 week (around 12/23/2020) for OB VISIT, MD or APP.  Future Appointments  Date Time Provider Department Center  12/23/2020  1:50 PM Warden Fillers, MD [facility] None    Milas Hock, MD

## 2020-12-23 ENCOUNTER — Inpatient Hospital Stay (HOSPITAL_COMMUNITY)
Admission: AD | Admit: 2020-12-23 | Discharge: 2020-12-24 | Disposition: A | Discharge status: Home / Self Care | Payer: Medicaid Other | Attending: Obstetrics & Gynecology | Admitting: Obstetrics & Gynecology

## 2020-12-23 ENCOUNTER — Other Ambulatory Visit: Payer: Self-pay

## 2020-12-23 ENCOUNTER — Encounter (HOSPITAL_COMMUNITY): Payer: Self-pay | Admitting: Obstetrics & Gynecology

## 2020-12-23 ENCOUNTER — Ambulatory Visit (INDEPENDENT_AMBULATORY_CARE_PROVIDER_SITE_OTHER): Payer: Medicaid Other | Admitting: Obstetrics and Gynecology

## 2020-12-23 ENCOUNTER — Other Ambulatory Visit (HOSPITAL_COMMUNITY)
Admission: RE | Admit: 2020-12-23 | Discharge: 2020-12-23 | Disposition: A | Discharge status: Home / Self Care | Payer: Medicaid Other | Source: Ambulatory Visit | Attending: Obstetrics and Gynecology | Admitting: Obstetrics and Gynecology

## 2020-12-23 VITALS — BP 109/70 | HR 116 | Wt 174.3 lb

## 2020-12-23 DIAGNOSIS — R110 Nausea: Secondary | ICD-10-CM

## 2020-12-23 DIAGNOSIS — Z881 Allergy status to other antibiotic agents status: Secondary | ICD-10-CM | POA: Insufficient documentation

## 2020-12-23 DIAGNOSIS — J45909 Unspecified asthma, uncomplicated: Secondary | ICD-10-CM | POA: Diagnosis not present

## 2020-12-23 DIAGNOSIS — Z3480 Encounter for supervision of other normal pregnancy, unspecified trimester: Secondary | ICD-10-CM | POA: Diagnosis not present

## 2020-12-23 DIAGNOSIS — O99013 Anemia complicating pregnancy, third trimester: Secondary | ICD-10-CM

## 2020-12-23 DIAGNOSIS — O219 Vomiting of pregnancy, unspecified: Secondary | ICD-10-CM | POA: Diagnosis not present

## 2020-12-23 DIAGNOSIS — O99513 Diseases of the respiratory system complicating pregnancy, third trimester: Secondary | ICD-10-CM | POA: Diagnosis not present

## 2020-12-23 DIAGNOSIS — O4703 False labor before 37 completed weeks of gestation, third trimester: Secondary | ICD-10-CM | POA: Insufficient documentation

## 2020-12-23 DIAGNOSIS — Z3A36 36 weeks gestation of pregnancy: Secondary | ICD-10-CM | POA: Insufficient documentation

## 2020-12-23 DIAGNOSIS — J4521 Mild intermittent asthma with (acute) exacerbation: Secondary | ICD-10-CM

## 2020-12-23 DIAGNOSIS — O26899 Other specified pregnancy related conditions, unspecified trimester: Secondary | ICD-10-CM

## 2020-12-23 DIAGNOSIS — E0590 Thyrotoxicosis, unspecified without thyrotoxic crisis or storm: Secondary | ICD-10-CM

## 2020-12-23 DIAGNOSIS — O212 Late vomiting of pregnancy: Secondary | ICD-10-CM | POA: Insufficient documentation

## 2020-12-23 DIAGNOSIS — Z3689 Encounter for other specified antenatal screening: Secondary | ICD-10-CM

## 2020-12-23 DIAGNOSIS — O479 False labor, unspecified: Secondary | ICD-10-CM

## 2020-12-23 MED ORDER — ONDANSETRON HCL 4 MG/2ML IJ SOLN
4.0000 mg | Freq: Once | INTRAMUSCULAR | Status: AC
  Administered 2020-12-23: 4 mg via INTRAVENOUS
  Filled 2020-12-23: qty 2

## 2020-12-23 MED ORDER — LACTATED RINGERS IV BOLUS
1000.0000 mL | Freq: Once | INTRAVENOUS | Status: AC
  Administered 2020-12-23: 1000 mL via INTRAVENOUS

## 2020-12-23 MED ORDER — ALBUTEROL SULFATE (2.5 MG/3ML) 0.083% IN NEBU
3.0000 mL | INHALATION_SOLUTION | RESPIRATORY_TRACT | Status: DC | PRN
  Administered 2020-12-23: 3 mL via RESPIRATORY_TRACT
  Filled 2020-12-23: qty 3

## 2020-12-23 MED ORDER — FERROUS SULFATE 325 (65 FE) MG PO TABS: 325.0000 mg | ORAL_TABLET | ORAL | 5 refills | Status: DC

## 2020-12-23 NOTE — Progress Notes (Signed)
   PRENATAL VISIT NOTE  Subjective:  Ashley Sparks is a 25 y.o. G2P1001 at [redacted]w[redacted]d being seen today for ongoing prenatal care.  She is currently monitored for the following issues for this low-risk pregnancy and has Lactose malabsorption; Subclinical hyperthyroidism; PAC (premature atrial contraction); PVC (premature ventricular contraction); Iron deficiency anemia secondary to blood loss (chronic); Anemia complicating pregnancy in third trimester; Supervision of normal pregnancy, antepartum; Graves disease; UTI (urinary tract infection); Shortness of breath; Seasonal allergies; Palpitations; Lactose intolerance; Keloid; Hypothyroid; Gluten intolerance; Depression; Constipation; Obesity (BMI 30-39.9); and [redacted] weeks gestation of pregnancy on their problem list.  Patient doing well with no acute concerns today. She reports occasional contractions.  Contractions: Irregular. Vag. Bleeding: None.  Movement: Present. Denies leaking of fluid.   The following portions of the patient's history were reviewed and updated as appropriate: allergies, current medications, past family history, past medical history, past social history, past surgical history and problem list. Problem list updated.  Objective:   Vitals:   12/23/20 1358  BP: 109/70  Pulse: (!) 116  Weight: 174 lb 4.8 oz (79.1 kg)    Fetal Status: Fetal Heart Rate (bpm): 139 Fundal Height: 37 cm Movement: Present     General:  Alert, oriented and cooperative. Patient is in no acute distress.  Skin: Skin is warm and dry. No rash noted.   Cardiovascular: Normal heart rate noted  Respiratory: Normal respiratory effort, no problems with respiration noted  Abdomen: Soft, gravid, appropriate for gestational age.  Pain/Pressure: Present     Pelvic: Cervical exam performed Dilation: 1 Effacement (%): 60 Station: -2  Extremities: Normal range of motion.  Edema: Trace  Mental Status:  Normal mood and affect. Normal behavior. Normal judgment and thought  content.   Assessment and Plan:  Pregnancy: G2P1001 at [redacted]w[redacted]d  1. Anemia affecting pregnancy in third trimester Pt states rx keeps getting canceled at the pharmacy, rx has been resent - ferrous sulfate 325 (65 FE) MG tablet; Take 1 tablet (325 mg total) by mouth every other day.  Dispense: 60 tablet; Refill: 5  2. [redacted] weeks gestation of pregnancy   3. Subclinical hyperthyroidism   4. Anemia complicating pregnancy in third trimester   5. Supervision of other normal pregnancy, antepartum Continue routine care - Culture, beta strep (group b only) - Cervicovaginal ancillary only( [place])  Term labor symptoms and general obstetric precautions including but not limited to vaginal bleeding, contractions, leaking of fluid and fetal movement were reviewed in detail with the patient.  Please refer to After Visit Summary for other counseling recommendations.   Return in about 1 week (around 12/30/2020) for ROB, in person.   Mariel Aloe, MD Faculty Attending Center for [facility]

## 2020-12-23 NOTE — MAU Note (Signed)
Pt arrives via EMS with complaint of contractions since her MD appointment today. Denies bleeding or ROM. Reports good fetal movement.

## 2020-12-23 NOTE — Progress Notes (Signed)
ROB 36.6 wks GBS, GC/CC today Reports "bad" UC's last night. Resolved.

## 2020-12-23 NOTE — MAU Note (Signed)
Pt reports she initially called EMS because her chest was feeling tight. Reports a history of asthma. Contractions started when ems arrived. Pt reports she is still having the chest tightness off/on

## 2020-12-23 NOTE — MAU Provider Note (Signed)
Chief Complaint:  Contractions   Event Date/Time   First Provider Initiated Contact with Patient 12/23/20 2122     HPI: Ashley Sparks is a 25 y.o. G2P1001 at [redacted]w[redacted]d who presents to maternity admissions via EMS reporting asthma exacerbation and contractions. She has been having contractions q2-81min today but also nausea with one episode of vomiting earlier today. Around 7pm began noting tightness in her chest with contractions and felt like she was going to have an asthma attack so called EMS (she cannot find her inhaler). Still feeling nauseated, SOB with contractions, and lightheaded. Denies vaginal bleeding, leaking of fluid, decreased fetal movement, fever, falls, or recent illness.   Pregnancy Course: Receives care at [facility], records reviewed  Past Medical History:  Diagnosis Date   Constipation    Depression    Gluten intolerance    Hypothyroid    Keloid    Lactose intolerance    PAC (premature atrial contraction) 08/24/2016   Palpitations 07/01/2016   PVC (premature ventricular contraction) 08/24/2016   Seasonal allergies    Shortness of breath 07/01/2016   typically during allergy season   UTI (urinary tract infection)    OB History  Gravida Para Term Preterm AB Living  2 1 1  0 0 1  SAB IAB Ectopic Multiple Live Births  0 0 0 0 1    # Outcome Date GA Lbr Len/2nd Weight Sex Delivery Anes PTL Lv  2 Current           1 Term 01/12/17 [redacted]w[redacted]d 19:46 / 00:26 7 lb 12.9 oz (3.541 kg) F Vag-Spont EPI  LIV     Birth Comments: wnl   History reviewed. No pertinent surgical history. Family History  Adopted: Yes  Problem Relation Age of Onset   Diabetes Mother    Seizures Mother    Hyperthyroidism Maternal Aunt    Celiac disease Neg Hx    Cholelithiasis Neg Hx    Ulcers Neg Hx    Social History   Tobacco Use   Smoking status: Never   Smokeless tobacco: Never  Vaping Use   Vaping Use: Never used  Substance Use Topics   Alcohol use: No   Drug use: Not Currently    Allergies  Allergen Reactions   Lactose Intolerance (Gi) Diarrhea and Other (See Comments)    Gi upset    Gluten Meal Hives, Diarrhea and Other (See Comments)    And abdominal pain   Macrobid [Nitrofurantoin Monohyd Macro] Hives and Rash   No medications prior to admission.   I have reviewed patient's Past Medical Hx, Surgical Hx, Family Hx, Social Hx, medications and allergies.   ROS:  Pertinent items noted in HPI and remainder of comprehensive ROS otherwise negative.  Physical Exam  Patient Vitals for the past 24 hrs:  BP Temp Temp src Pulse Resp SpO2  12/24/20 0127 -- -- -- -- 17 --  12/24/20 0124 (!) 120/57 -- -- 76 -- --  12/24/20 0120 -- -- -- -- -- 99 %  12/23/20 2228 -- -- -- -- -- 100 %  12/23/20 2013 116/70 -- -- (!) 106 -- --  12/23/20 2008 (!) 85/65 98.7 F (37.1 C) Oral (!) 109 19 100 %   Constitutional: Well-developed, well-nourished female in mild distress.  Cardiovascular: initially tachycardic but normal rhythm and no murmurs heard Respiratory: normal effort, lung sounds clear throughout GI: Abd soft, non-tender, gravid appropriate for gestational age. Pos BS x 4 MS: Extremities nontender, no edema, normal ROM Neurologic: Alert and oriented  x 4.  GU: no CVA tenderness Pelvic: NEFG, physiologic discharge, no blood, cervix clean.   Dilation: 2 Effacement (%): 50 Cervical Position: Posterior Station: -2 Presentation: Vertex Exam by:: weston,rn Cervical exam remained unchanged x3  Fetal Tracing: reactive Baseline: 135 Variability: moderate Accelerations: 15x15 Decelerations: none Toco: q2.5-59min   Labs: No results found for this or any previous visit (from the past 24 hour(s)).  Imaging:  No results found.  MAU Course: Orders Placed This Encounter  Procedures   Discharge patient   Meds ordered this encounter  Medications   lactated ringers bolus 1,000 mL   ondansetron (ZOFRAN) injection 4 mg   albuterol (PROVENTIL) (2.5 MG/3ML) 0.083%  nebulizer solution 3 mL   albuterol (PROVENTIL) (2.5 MG/3ML) 0.083% nebulizer solution    Sig: Inhale 3 mLs into the lungs every 4 (four) hours as needed for wheezing or shortness of breath.    Dispense:  75 mL    Refill:  12    Order Specific Question:   Supervising Provider    Answer:   Reva Bores [[id number]]   MDM: LR bolus + zofran and an albuterol inhaler ordered with good relief of nausea/SOB with contractions. Contractions continued so we put her in observation and did two additional cervical exams. Both were unchanged.  Went to discuss prodromal labor, pt started red raspberry leaf tea and evening primrose oil last night. Advised her to stop using cervical ripening agents until 39wks, encouraged consumption of dates since they are shown to be effective without uterine stimulant properties until labor has begun on its own. Pt expressed understanding.  Assessment: 1. False labor   2. NST (non-stress test) reactive   3. Pregnancy related nausea, antepartum   4. Mild intermittent asthma with exacerbation    Plan: Discharge home in stable condition with term labor precautions.     Follow-up Information     [facility]. Go to.   Specialty: Obstetrics and Gynecology Why: as scheduled for ongoing prenatal care Contact information: [address] [place] [address] [phone]                Allergies as of 12/24/2020       Reactions   Lactose Intolerance (gi) Diarrhea, Other (See Comments)   Gi upset   Gluten Meal Hives, Diarrhea, Other (See Comments)   And abdominal pain   Macrobid [nitrofurantoin Monohyd Macro] Hives, Rash        Medication List     STOP taking these medications    promethazine 25 MG tablet Commonly known as: PHENERGAN       TAKE these medications    albuterol (2.5 MG/3ML) 0.083% nebulizer solution Commonly known as: PROVENTIL Inhale 3 mLs into the lungs every 4 (four) hours  as needed for wheezing or shortness of breath.   ferrous sulfate 325 (65 FE) MG tablet Take 1 tablet (325 mg total) by mouth every other day.   ONE A DAY PRENATAL PO Take by mouth.       Edd Arbour, CNM, MSN, IBCLC Certified Nurse Midwife, [facility]

## 2020-12-24 DIAGNOSIS — J45909 Unspecified asthma, uncomplicated: Secondary | ICD-10-CM

## 2020-12-24 DIAGNOSIS — O219 Vomiting of pregnancy, unspecified: Secondary | ICD-10-CM

## 2020-12-24 DIAGNOSIS — O99513 Diseases of the respiratory system complicating pregnancy, third trimester: Secondary | ICD-10-CM

## 2020-12-24 DIAGNOSIS — O4703 False labor before 37 completed weeks of gestation, third trimester: Secondary | ICD-10-CM

## 2020-12-24 DIAGNOSIS — Z3A36 36 weeks gestation of pregnancy: Secondary | ICD-10-CM

## 2020-12-24 LAB — CERVICOVAGINAL ANCILLARY ONLY
Chlamydia: NEGATIVE
Comment: NEGATIVE
Comment: NORMAL
Neisseria Gonorrhea: NEGATIVE

## 2020-12-24 MED ORDER — ALBUTEROL SULFATE (2.5 MG/3ML) 0.083% IN NEBU: 3.0000 mL | INHALATION_SOLUTION | RESPIRATORY_TRACT | 12 refills | Status: DC | PRN

## 2020-12-24 NOTE — MAU Note (Signed)
Pt reports less tightness in her chest after nebulizer

## 2020-12-27 LAB — CULTURE, BETA STREP (GROUP B ONLY): Strep Gp B Culture: POSITIVE — AB

## 2020-12-30 ENCOUNTER — Ambulatory Visit (INDEPENDENT_AMBULATORY_CARE_PROVIDER_SITE_OTHER): Payer: Medicaid Other | Admitting: Obstetrics and Gynecology

## 2020-12-30 ENCOUNTER — Encounter: Payer: Self-pay | Admitting: Obstetrics and Gynecology

## 2020-12-30 ENCOUNTER — Other Ambulatory Visit: Payer: Self-pay

## 2020-12-30 VITALS — BP 114/72 | HR 97 | Wt 176.0 lb

## 2020-12-30 DIAGNOSIS — Z3480 Encounter for supervision of other normal pregnancy, unspecified trimester: Secondary | ICD-10-CM

## 2020-12-30 DIAGNOSIS — O99013 Anemia complicating pregnancy, third trimester: Secondary | ICD-10-CM

## 2020-12-30 MED ORDER — ALBUTEROL SULFATE HFA 108 (90 BASE) MCG/ACT IN AERS: 2.0000 | INHALATION_SPRAY | Freq: Four times a day (QID) | RESPIRATORY_TRACT | 2 refills | Status: AC | PRN

## 2020-12-30 NOTE — Progress Notes (Signed)
Pt states that she has had some recent spotting.  Pt is interested in cervix check.

## 2020-12-30 NOTE — Addendum Note (Signed)
Addended by: Milas Hock A on: 12/30/2020 04:27 PM   Modules accepted: Orders

## 2020-12-30 NOTE — Progress Notes (Signed)
   PRENATAL VISIT NOTE  Subjective:  Ashley Sparks is a 25 y.o. G2P1001 at [redacted]w[redacted]d being seen today for ongoing prenatal care.  She is currently monitored for the following issues for this low-risk pregnancy and has Lactose malabsorption; Subclinical hyperthyroidism; PAC (premature atrial contraction); PVC (premature ventricular contraction); Iron deficiency anemia secondary to blood loss (chronic); Anemia complicating pregnancy in third trimester; Supervision of normal pregnancy, antepartum; Graves disease; UTI (urinary tract infection); Shortness of breath; Seasonal allergies; Palpitations; Lactose intolerance; Keloid; Hypothyroid; Gluten intolerance; Depression; Constipation; and Obesity (BMI 30-39.9) on their problem list.  Patient reports occasional contractions.  Contractions: Irregular. Vag. Bleeding: None.  Movement: Present. Denies leaking of fluid.   The following portions of the patient's history were reviewed and updated as appropriate: allergies, current medications, past family history, past medical history, past social history, past surgical history and problem list.   Objective:   Vitals:   12/30/20 1555  BP: 114/72  Pulse: 97  Weight: 176 lb (79.8 kg)    Fetal Status: Fetal Heart Rate (bpm): 130   Movement: Present     General:  Alert, oriented and cooperative. Patient is in no acute distress.  Skin: Skin is warm and dry. No rash noted.   Cardiovascular: Normal heart rate noted  Respiratory: Normal respiratory effort, no problems with respiration noted  Abdomen: Soft, gravid, appropriate for gestational age.  Pain/Pressure: Present     Pelvic: 1/60/-4, head is ballotable        Extremities: Normal range of motion.     Mental Status: Normal mood and affect. Normal behavior. Normal judgment and thought content.   Assessment and Plan:  Pregnancy: G2P1001 at [redacted]w[redacted]d 1. Supervision of other normal pregnancy, antepartum Reviewed GBS pos and PCN in labor Discussed elective  IOL. She would be interested if possible. Discussed wouldn't yet scheduled based on cervical exam - would recheck next week and then schedule accordingly.   2. Anemia complicating pregnancy in third trimester - Will have staff call pharmacy about iron  Term labor symptoms and general obstetric precautions including but not limited to vaginal bleeding, contractions, leaking of fluid and fetal movement were reviewed in detail with the patient. Please refer to After Visit Summary for other counseling recommendations.   Return in about 1 week (around 01/06/2021) for OB VISIT, MD or APP.  No future appointments.  Milas Hock, MD

## 2021-01-05 ENCOUNTER — Inpatient Hospital Stay (HOSPITAL_COMMUNITY)
Admission: AD | Admit: 2021-01-05 | Discharge: 2021-01-05 | Disposition: A | Discharge status: Home / Self Care | Payer: Medicaid Other | Attending: Obstetrics & Gynecology | Admitting: Obstetrics & Gynecology

## 2021-01-05 ENCOUNTER — Encounter (HOSPITAL_COMMUNITY): Payer: Self-pay | Admitting: Obstetrics & Gynecology

## 2021-01-05 ENCOUNTER — Other Ambulatory Visit: Payer: Self-pay

## 2021-01-05 DIAGNOSIS — Z79899 Other long term (current) drug therapy: Secondary | ICD-10-CM | POA: Diagnosis not present

## 2021-01-05 DIAGNOSIS — U071 COVID-19: Secondary | ICD-10-CM | POA: Diagnosis not present

## 2021-01-05 DIAGNOSIS — Z3A38 38 weeks gestation of pregnancy: Secondary | ICD-10-CM | POA: Diagnosis not present

## 2021-01-05 DIAGNOSIS — O98513 Other viral diseases complicating pregnancy, third trimester: Secondary | ICD-10-CM

## 2021-01-05 DIAGNOSIS — O4693 Antepartum hemorrhage, unspecified, third trimester: Secondary | ICD-10-CM | POA: Diagnosis not present

## 2021-01-05 DIAGNOSIS — O26893 Other specified pregnancy related conditions, third trimester: Secondary | ICD-10-CM | POA: Diagnosis present

## 2021-01-05 LAB — URINALYSIS, ROUTINE W REFLEX MICROSCOPIC
Bilirubin Urine: NEGATIVE
Glucose, UA: NEGATIVE mg/dL
Ketones, ur: NEGATIVE mg/dL
Nitrite: NEGATIVE
Protein, ur: NEGATIVE mg/dL
Specific Gravity, Urine: 1.004 — ABNORMAL LOW (ref 1.005–1.030)
pH: 7 (ref 5.0–8.0)

## 2021-01-05 LAB — CBC
HCT: 29.9 % — ABNORMAL LOW (ref 36.0–46.0)
Hemoglobin: 9.4 g/dL — ABNORMAL LOW (ref 12.0–15.0)
MCH: 25.1 pg — ABNORMAL LOW (ref 26.0–34.0)
MCHC: 31.4 g/dL (ref 30.0–36.0)
MCV: 79.9 fL — ABNORMAL LOW (ref 80.0–100.0)
Platelets: 172 10*3/uL (ref 150–400)
RBC: 3.74 MIL/uL — ABNORMAL LOW (ref 3.87–5.11)
RDW: 14 % (ref 11.5–15.5)
WBC: 12.5 10*3/uL — ABNORMAL HIGH (ref 4.0–10.5)
nRBC: 0.2 % (ref 0.0–0.2)

## 2021-01-05 NOTE — MAU Provider Note (Signed)
Chief Complaint:  Vaginal Bleeding and Pelvic Pain   Event Date/Time   First Provider Initiated Contact with Patient 01/05/21 1854      HPI: Ashley Sparks is a 25 y.o. G2P1001 at [redacted]w[redacted]d by early ultrasound who presents to maternity admissions reporting vaginal bleeding x 4 days. She is also COVID positive with mild congestion only with known exposure with he daughter who is positive.  She had a cervical exam in the office 6 days ago but started bleeding 2 days later. The bleeding is enough to require a pantyliner but not a pad.  She reports seeing quarter sized clots.  She has an appointment at [facility] tomorrow and was supposed to discuss her elective 39 week IOL. She is concerned about this appointment and the plan with her COVID diagnosis.  She reports intermittent back pain and some wetness in her underwear in addition to the bleeding and some difficulty urinating but no other symptoms.  She reports good fetal movement.   Location: back Quality: cramping Severity: 5/10 on pain scale Duration: 1 day Timing: intermittent Modifying factors: none Associated signs and symptoms: vaginal bleeding  HPI  Past Medical History: Past Medical History:  Diagnosis Date   Constipation    Depression    Gluten intolerance    Hypothyroid    Keloid    Lactose intolerance    PAC (premature atrial contraction) 08/24/2016   Palpitations 07/01/2016   PVC (premature ventricular contraction) 08/24/2016   Seasonal allergies    Shortness of breath 07/01/2016   typically during allergy season   UTI (urinary tract infection)     Past obstetric history: OB History  Gravida Para Term Preterm AB Living  2 1 1  0 0 1  SAB IAB Ectopic Multiple Live Births  0 0 0 0 1    # Outcome Date GA Lbr Len/2nd Weight Sex Delivery Anes PTL Lv  2 Current           1 Term 01/12/17 [redacted]w[redacted]d 19:46 / 00:26 3541 g F Vag-Spont EPI  LIV     Birth Comments: wnl    Past Surgical History: History reviewed. No pertinent  surgical history.  Family History: Family History  Adopted: Yes  Problem Relation Age of Onset   Diabetes Mother    Seizures Mother    Hyperthyroidism Maternal Aunt    Celiac disease Neg Hx    Cholelithiasis Neg Hx    Ulcers Neg Hx     Social History: Social History   Tobacco Use   Smoking status: Never   Smokeless tobacco: Never  Vaping Use   Vaping Use: Never used  Substance Use Topics   Alcohol use: No   Drug use: Not Currently    Allergies:  Allergies  Allergen Reactions   Lactose Intolerance (Gi) Diarrhea and Other (See Comments)    Gi upset    Gluten Meal Hives, Diarrhea and Other (See Comments)    And abdominal pain   Macrobid [Nitrofurantoin Monohyd Macro] Hives and Rash    Meds:  Medications Prior to Admission  Medication Sig Dispense Refill Last Dose   Prenatal MV & Min w/FA-DHA (ONE A DAY PRENATAL PO) Take by mouth.   01/04/2021   albuterol (VENTOLIN HFA) 108 (90 Base) MCG/ACT inhaler Inhale 2 puffs into the lungs every 6 (six) hours as needed for wheezing or shortness of breath. 8 g 2    ferrous sulfate 325 (65 FE) MG tablet Take 1 tablet (325 mg total) by mouth every other day.  60 tablet 5     ROS:  Review of Systems  Constitutional:  Negative for chills, fatigue and fever.  HENT:  Positive for congestion.   Eyes:  Negative for visual disturbance.  Respiratory:  Negative for shortness of breath.   Cardiovascular:  Negative for chest pain.  Gastrointestinal:  Negative for abdominal pain, nausea and vomiting.  Genitourinary:  Positive for vaginal bleeding and vaginal discharge. Negative for difficulty urinating, dysuria, flank pain, pelvic pain and vaginal pain.  Musculoskeletal:  Positive for back pain.  Neurological:  Negative for dizziness and headaches.  Psychiatric/Behavioral: Negative.      I have reviewed patient's Past Medical Hx, Surgical Hx, Family Hx, Social Hx, medications and allergies.   Physical Exam  Patient Vitals for the past  24 hrs:  BP Temp Temp src Pulse Resp SpO2  01/05/21 1815 115/60 98.1 F (36.7 C) Oral (!) 108 18 98 %   Constitutional: Well-developed, well-nourished female in no acute distress.  Cardiovascular: normal rate Respiratory: normal effort GI: Abd soft, non-tender, gravid appropriate for gestational age.  MS: Extremities nontender, no edema, normal ROM Neurologic: Alert and oriented x 4.  GU: Neg CVAT.  PELVIC EXAM: Cervix pink, visually closed, without lesion, scant white creamy discharge, vaginal walls and external genitalia normal Bimanual exam: Cervix 0/long/high, firm, anterior, neg CMT, uterus nontender, nonenlarged, adnexa without tenderness, enlargement, or mass  Dilation: 2 Effacement (%): 80 Cervical Position: Posterior Exam by:: Sharen Counter, CNM  FHT:  Baseline 125 , moderate variability, accelerations present, no decelerations Contractions: q 2-6 mins, mild to palpation   Labs: Results for orders placed or performed during the hospital encounter of 01/05/21 (from the past 24 hour(s))  CBC     Status: Abnormal   Collection Time: 01/05/21  8:04 PM  Result Value Ref Range   WBC 12.5 (H) 4.0 - 10.5 K/uL   RBC 3.74 (L) 3.87 - 5.11 MIL/uL   Hemoglobin 9.4 (L) 12.0 - 15.0 g/dL   HCT 16.1 (L) 09.6 - 04.5 %   MCV 79.9 (L) 80.0 - 100.0 fL   MCH 25.1 (L) 26.0 - 34.0 pg   MCHC 31.4 30.0 - 36.0 g/dL   RDW 40.9 81.1 - 91.4 %   Platelets 172 150 - 400 K/uL   nRBC 0.2 0.0 - 0.2 %   A/Positive/-- (02/10 1336)  Imaging:  No results found.  MAU Course/MDM: Orders Placed This Encounter  Procedures   Urinalysis, Routine w reflex microscopic   CBC   Discharge patient    No orders of the defined types were placed in this encounter.    NST reviewed and reactive Negative pooling and ferning Cervix unchanged from previous exam in the office, no blood on SSE or on glove following digital exam Consult Dr Charlotta Newton with presentation, exam findings and test results.  CBC and  UA obtained CBC wnl, UA pending No bleeding on pad in MAU Pt desires discharge so D/C home with labor/bleeding/COVID precautions Keep scheduled appt tomorrow virtually with [facility] Will review urine results at tomorrow's visit   Assessment: 1. Vaginal bleeding in pregnancy, third trimester   2. COVID-19 affecting pregnancy in third trimester   3. [redacted] weeks gestation of pregnancy     Plan: Discharge home Labor precautions and fetal kick counts  Follow-up Information     [facility] Follow up.   Why: Your appointment on 01/06/21 at 0955 am will be changed to a virtual visit. The office will call you. Contact information: [address] [place] [address] [phone]        [facility] Follow up.   Specialty: Obstetrics and Gynecology Why: With signs of labor or emergencies Contact information: [address] [id number] [place] [address] [phone]               Allergies as of 01/05/2021       Reactions   Lactose Intolerance (gi) Diarrhea, Other (See Comments)   Gi upset   Gluten Meal Hives, Diarrhea, Other (See Comments)   And abdominal pain   Macrobid [nitrofurantoin Monohyd Macro] Hives, Rash        Medication List     TAKE these medications    albuterol 108 (90 Base) MCG/ACT inhaler Commonly known as: VENTOLIN HFA Inhale 2 puffs into the lungs every 6 (six) hours as needed for wheezing or shortness of breath.   ferrous sulfate 325 (65 FE) MG tablet Take 1 tablet (325 mg total) by mouth every other day.   ONE A DAY PRENATAL PO Take by mouth.        Sharen Counter Certified Nurse-Midwife 01/05/2021 9:01 PM

## 2021-01-05 NOTE — Discharge Instructions (Signed)
Things to Try After 37 weeks to Encourage Labor/Get Ready for Labor:  ° ° Try the Miles Circuit at www.milescircuit.com daily to improve baby's position and encourage the onset of labor. ° °Walk a little and rest a little every day.  Change positions often. ° °Cervical Ripening: May try one or both °Red Raspberry Leaf capsules or tea:  two 300mg or 400mg tablets with each meal, 2-3 times a day, or 1-3 cups of tea daily  °Potential Side Effects Of Raspberry Leaf:  °Most women do not experience any side effects from drinking raspberry leaf tea. However, nausea and loose stools are possible  ° °Evening Primrose Oil capsules: take 1 capsule by mouth and place one capsule in the vagina every night.    °Some of the potential side effects:  °Upset stomach  °Loose stools or diarrhea  °Headaches  °Nausea ° °Sex can also help the cervix ripen and encourage labor onset. °  °Reasons to return to MAU at [facility]: ° °1.  Contractions are  5 minutes apart or less, each last 1 minute, these have been going on for 1-2 hours, and you cannot walk or talk during them °2.  You have a large gush of fluid, or a trickle of fluid that will not stop and you have to wear a pad °3.  You have bleeding that is bright red, heavier than spotting--like menstrual bleeding (spotting can be normal in early labor or after a check of your cervix) °4.  You do not feel the baby moving like he/she normally does  °

## 2021-01-05 NOTE — MAU Note (Signed)
Pt reports to mau with c/o vag bleeding for the past week.  Pt reports seeing quarter size clots.  Pt reports pelvic pressure since last night.  Denies LOF or ctx.  Reports pos covid test today after exposure to her daughter.  Reports loss of taste, fever, body aches and congestion.  Reports DFM for past 4 days.  FHR 137.

## 2021-01-06 ENCOUNTER — Encounter: Payer: Self-pay | Admitting: Advanced Practice Midwife

## 2021-01-06 ENCOUNTER — Telehealth (INDEPENDENT_AMBULATORY_CARE_PROVIDER_SITE_OTHER): Payer: Medicaid Other | Admitting: Advanced Practice Midwife

## 2021-01-06 VITALS — Wt 176.0 lb

## 2021-01-06 DIAGNOSIS — O2343 Unspecified infection of urinary tract in pregnancy, third trimester: Secondary | ICD-10-CM

## 2021-01-06 DIAGNOSIS — O98513 Other viral diseases complicating pregnancy, third trimester: Secondary | ICD-10-CM

## 2021-01-06 DIAGNOSIS — Z3A38 38 weeks gestation of pregnancy: Secondary | ICD-10-CM

## 2021-01-06 DIAGNOSIS — D509 Iron deficiency anemia, unspecified: Secondary | ICD-10-CM

## 2021-01-06 DIAGNOSIS — O99013 Anemia complicating pregnancy, third trimester: Secondary | ICD-10-CM

## 2021-01-06 DIAGNOSIS — U071 COVID-19: Secondary | ICD-10-CM | POA: Insufficient documentation

## 2021-01-06 DIAGNOSIS — Z3480 Encounter for supervision of other normal pregnancy, unspecified trimester: Secondary | ICD-10-CM

## 2021-01-06 NOTE — Progress Notes (Signed)
+   Fetal movement. Pt is being seen through virtual visit due to +Covid diagnosis. Pt states she is feeling ok except for being congested. Pt is afebrile today.

## 2021-01-06 NOTE — Progress Notes (Signed)
OBSTETRICS PRENATAL VIRTUAL VISIT ENCOUNTER NOTE  Provider location: [facility]   Patient location: Home  I connected with Lyndal Pulley on 01/06/21 at  9:55 AM EDT by MyChart Video Encounter and verified that I am speaking with the correct person using two identifiers. I discussed the limitations, risks, security and privacy concerns of performing an evaluation and management service virtually and the availability of in person appointments. I also discussed with the patient that there may be a patient responsible charge related to this service. The patient expressed understanding and agreed to proceed. Subjective:  Ashley Sparks is a 25 y.o. G2P1001 at [redacted]w[redacted]d being seen today for ongoing prenatal care.  She is currently monitored for the following issues for this low-risk pregnancy and has Lactose malabsorption; Subclinical hyperthyroidism; PAC (premature atrial contraction); PVC (premature ventricular contraction); Iron deficiency anemia secondary to blood loss (chronic); Anemia complicating pregnancy in third trimester; Supervision of normal pregnancy, antepartum; Graves disease; UTI (urinary tract infection); Shortness of breath; Seasonal allergies; Palpitations; Lactose intolerance; Keloid; Hypothyroid; Gluten intolerance; Depression; Constipation; and Obesity (BMI 30-39.9) on their problem list.  Patient reports occasional contractions and body aches today .  Contractions: Irregular. Vag. Bleeding: None.  Movement: Present. Denies any leaking of fluid.   The following portions of the patient's history were reviewed and updated as appropriate: allergies, current medications, past family history, past medical history, past social history, past surgical history and problem list.   Objective:   Vitals:   01/06/21 0918  Weight: 176 lb (79.8 kg)    Fetal Status:     Movement: Present     General:  Alert, oriented and cooperative. Patient is in no acute  distress.  Respiratory: Normal respiratory effort, no problems with respiration noted  Mental Status: Normal mood and affect. Normal behavior. Normal judgment and thought content.  Rest of physical exam deferred due to type of encounter  Imaging: No results found.  Assessment and Plan:  Pregnancy: G2P1001 at [redacted]w[redacted]d 1. Supervision of other normal pregnancy, antepartum --Anticipatory guidance about next visits/weeks of pregnancy given. --IOL on 9/7 or 9/8, elective, 10 days after COVID + --IOL orders placed --Reviewed signs of labor/warning signs/reasons to come to hospital  2. COVID-19 affecting pregnancy in third trimester --Dx on 01/05/21 on home test, pt daughter is positive with symptoms --mild symptoms of congestion, body aches today, no fever or SOB  3. Anemia complicating pregnancy in third trimester --Hgb 9.4, stable, taking oral iron   4. UTI (urinary tract infection) during pregnancy, third trimester --Pt was seen in MAU on 8/28 for bleeding but no vaginal bleeding was visualized.  UA resulted after pt discharge with moderate leukocytes and small hemoglobin.  Will treat for possible UTI with Duricef BID x 7 days.     Term labor symptoms and general obstetric precautions including but not limited to vaginal bleeding, contractions, leaking of fluid and fetal movement were reviewed in detail with the patient. I discussed the assessment and treatment plan with the patient. The patient was provided an opportunity to ask questions and all were answered. The patient agreed with the plan and demonstrated an understanding of the instructions. The patient was advised to call back or seek an in-person office evaluation/go to MAU at [facility] for any urgent or concerning symptoms. Please refer to After Visit Summary for other counseling recommendations.   I provided 7 minutes of face-to-face time during this encounter.  No follow-ups on file.  Future Appointments  Date Time Provider Department Center  01/06/2021  9:55 AM Leftwich-Kirby, Wilmer Floor, CNM [facility] None    Sharen Counter, CNM [facility] [employer]

## 2021-01-09 DIAGNOSIS — Z8616 Personal history of COVID-19: Secondary | ICD-10-CM

## 2021-01-09 HISTORY — DX: Personal history of COVID-19: Z86.16

## 2021-01-10 ENCOUNTER — Encounter: Payer: Self-pay | Admitting: Hematology and Oncology

## 2021-01-10 ENCOUNTER — Encounter (HOSPITAL_COMMUNITY): Payer: Self-pay

## 2021-01-10 ENCOUNTER — Other Ambulatory Visit: Payer: Self-pay

## 2021-01-10 ENCOUNTER — Telehealth (HOSPITAL_COMMUNITY): Payer: Self-pay | Admitting: *Deleted

## 2021-01-10 ENCOUNTER — Encounter (HOSPITAL_COMMUNITY): Payer: Self-pay | Admitting: *Deleted

## 2021-01-10 ENCOUNTER — Inpatient Hospital Stay (HOSPITAL_COMMUNITY)
Admission: AD | Admit: 2021-01-10 | Discharge: 2021-01-12 | DRG: 805 | Disposition: A | Discharge status: Home / Self Care | Payer: Medicaid Other | Attending: Family Medicine | Admitting: Family Medicine

## 2021-01-10 ENCOUNTER — Encounter (HOSPITAL_COMMUNITY): Payer: Self-pay | Admitting: Obstetrics and Gynecology

## 2021-01-10 DIAGNOSIS — O4290 Premature rupture of membranes, unspecified as to length of time between rupture and onset of labor, unspecified weeks of gestation: Secondary | ICD-10-CM

## 2021-01-10 DIAGNOSIS — O9081 Anemia of the puerperium: Secondary | ICD-10-CM | POA: Diagnosis not present

## 2021-01-10 DIAGNOSIS — O99013 Anemia complicating pregnancy, third trimester: Secondary | ICD-10-CM | POA: Diagnosis present

## 2021-01-10 DIAGNOSIS — Z3A40 40 weeks gestation of pregnancy: Secondary | ICD-10-CM | POA: Diagnosis not present

## 2021-01-10 DIAGNOSIS — B9689 Other specified bacterial agents as the cause of diseases classified elsewhere: Secondary | ICD-10-CM | POA: Diagnosis present

## 2021-01-10 DIAGNOSIS — Z0371 Encounter for suspected problem with amniotic cavity and membrane ruled out: Secondary | ICD-10-CM

## 2021-01-10 DIAGNOSIS — Z8616 Personal history of COVID-19: Secondary | ICD-10-CM | POA: Diagnosis not present

## 2021-01-10 DIAGNOSIS — D62 Acute posthemorrhagic anemia: Secondary | ICD-10-CM | POA: Diagnosis not present

## 2021-01-10 DIAGNOSIS — O26893 Other specified pregnancy related conditions, third trimester: Secondary | ICD-10-CM | POA: Diagnosis present

## 2021-01-10 DIAGNOSIS — E0590 Thyrotoxicosis, unspecified without thyrotoxic crisis or storm: Secondary | ICD-10-CM | POA: Diagnosis present

## 2021-01-10 DIAGNOSIS — D500 Iron deficiency anemia secondary to blood loss (chronic): Secondary | ICD-10-CM

## 2021-01-10 DIAGNOSIS — O99891 Other specified diseases and conditions complicating pregnancy: Secondary | ICD-10-CM

## 2021-01-10 DIAGNOSIS — N760 Acute vaginitis: Secondary | ICD-10-CM

## 2021-01-10 DIAGNOSIS — E0500 Thyrotoxicosis with diffuse goiter without thyrotoxic crisis or storm: Secondary | ICD-10-CM | POA: Diagnosis present

## 2021-01-10 DIAGNOSIS — U071 COVID-19: Secondary | ICD-10-CM

## 2021-01-10 DIAGNOSIS — Z3A39 39 weeks gestation of pregnancy: Secondary | ICD-10-CM

## 2021-01-10 DIAGNOSIS — O99824 Streptococcus B carrier state complicating childbirth: Secondary | ICD-10-CM | POA: Diagnosis present

## 2021-01-10 DIAGNOSIS — O99820 Streptococcus B carrier state complicating pregnancy: Secondary | ICD-10-CM | POA: Diagnosis not present

## 2021-01-10 DIAGNOSIS — O98513 Other viral diseases complicating pregnancy, third trimester: Secondary | ICD-10-CM

## 2021-01-10 LAB — TYPE AND SCREEN
ABO/RH(D): A POS
Antibody Screen: NEGATIVE

## 2021-01-10 LAB — CBC
HCT: 31 % — ABNORMAL LOW (ref 36.0–46.0)
Hemoglobin: 9.6 g/dL — ABNORMAL LOW (ref 12.0–15.0)
MCH: 24.4 pg — ABNORMAL LOW (ref 26.0–34.0)
MCHC: 31 g/dL (ref 30.0–36.0)
MCV: 78.7 fL — ABNORMAL LOW (ref 80.0–100.0)
Platelets: 190 10*3/uL (ref 150–400)
RBC: 3.94 MIL/uL (ref 3.87–5.11)
RDW: 14.2 % (ref 11.5–15.5)
WBC: 18.6 10*3/uL — ABNORMAL HIGH (ref 4.0–10.5)
nRBC: 0 % (ref 0.0–0.2)

## 2021-01-10 LAB — WET PREP, GENITAL
Sperm: NONE SEEN
Trich, Wet Prep: NONE SEEN
Yeast Wet Prep HPF POC: NONE SEEN

## 2021-01-10 MED ORDER — ACETAMINOPHEN 325 MG PO TABS: 650.0000 mg | ORAL_TABLET | ORAL | Status: DC | PRN

## 2021-01-10 MED ORDER — METHYLERGONOVINE MALEATE 0.2 MG/ML IJ SOLN
INTRAMUSCULAR | Status: AC
  Administered 2021-01-10: 0.2 mg
  Filled 2021-01-10: qty 1

## 2021-01-10 MED ORDER — LACTATED RINGERS IV SOLN: INTRAVENOUS | Status: DC

## 2021-01-10 MED ORDER — TRANEXAMIC ACID-NACL 1000-0.7 MG/100ML-% IV SOLN
INTRAVENOUS | Status: AC
  Administered 2021-01-10: 1000 mg
  Filled 2021-01-10: qty 100

## 2021-01-10 MED ORDER — OXYCODONE-ACETAMINOPHEN 5-325 MG PO TABS: 1.0000 | ORAL_TABLET | ORAL | Status: DC | PRN

## 2021-01-10 MED ORDER — ONDANSETRON HCL 4 MG/2ML IJ SOLN: 4.0000 mg | Freq: Four times a day (QID) | INTRAMUSCULAR | Status: DC | PRN

## 2021-01-10 MED ORDER — TERBUTALINE SULFATE 1 MG/ML IJ SOLN
0.2500 mg | Freq: Once | INTRAMUSCULAR | Status: DC | PRN
Start: 2021-01-10 — End: 2021-01-10

## 2021-01-10 MED ORDER — SODIUM CHLORIDE 0.9 % IV SOLN: 5.0000 10*6.[IU] | Freq: Once | INTRAVENOUS | Status: DC

## 2021-01-10 MED ORDER — OXYTOCIN-SODIUM CHLORIDE 30-0.9 UT/500ML-% IV SOLN
INTRAVENOUS | Status: AC
  Filled 2021-01-10: qty 500

## 2021-01-10 MED ORDER — OXYTOCIN BOLUS FROM INFUSION
333.0000 mL | Freq: Once | INTRAVENOUS | Status: AC
  Administered 2021-01-10: 333 mL via INTRAVENOUS

## 2021-01-10 MED ORDER — PENICILLIN G POT IN DEXTROSE 60000 UNIT/ML IV SOLN: 3.0000 10*6.[IU] | INTRAVENOUS | Status: DC

## 2021-01-10 MED ORDER — LACTATED RINGERS IV SOLN
500.0000 mL | INTRAVENOUS | Status: DC | PRN
Start: 2021-01-10 — End: 2021-01-10

## 2021-01-10 MED ORDER — LIDOCAINE HCL (PF) 1 % IJ SOLN
30.0000 mL | INTRAMUSCULAR | Status: AC | PRN
Start: 2021-01-10 — End: 2021-01-10
  Administered 2021-01-10: 30 mL via SUBCUTANEOUS
  Filled 2021-01-10: qty 30

## 2021-01-10 MED ORDER — ONDANSETRON 4 MG PO TBDP
4.0000 mg | ORAL_TABLET | Freq: Once | ORAL | Status: AC
  Administered 2021-01-10: 4 mg via ORAL
  Filled 2021-01-10: qty 1

## 2021-01-10 MED ORDER — OXYCODONE-ACETAMINOPHEN 5-325 MG PO TABS: 2.0000 | ORAL_TABLET | ORAL | Status: DC | PRN

## 2021-01-10 MED ORDER — SOD CITRATE-CITRIC ACID 500-334 MG/5ML PO SOLN: 30.0000 mL | ORAL | Status: DC | PRN

## 2021-01-10 MED ORDER — OXYTOCIN-SODIUM CHLORIDE 30-0.9 UT/500ML-% IV SOLN: 2.5000 [IU]/h | INTRAVENOUS | Status: DC

## 2021-01-10 MED ORDER — OXYTOCIN-SODIUM CHLORIDE 30-0.9 UT/500ML-% IV SOLN: 1.0000 m[IU]/min | INTRAVENOUS | Status: DC

## 2021-01-10 MED ORDER — FENTANYL CITRATE (PF) 100 MCG/2ML IJ SOLN
INTRAMUSCULAR | Status: AC
  Administered 2021-01-10: 50 ug
  Filled 2021-01-10: qty 2

## 2021-01-10 NOTE — H&P (Signed)
OBSTETRIC ADMISSION HISTORY AND PHYSICAL  Ashley Sparks is a 25 y.o. female G2P1001 with IUP at [redacted]w[redacted]d by [place] presenting for labor. She reports +FMs, No LOF, no VB, no blurry vision, headaches or peripheral edema, and RUQ pain.  She plans on bottle feeding. She declines birth control at time of delivery/admission. She received her prenatal care at  [facility]    Dating: By LMP --->  Estimated Date of Delivery: 01/14/21  Sono:    @[redacted]w[redacted]d , CWD, normal anatomy, cephalic presentation, posterior placental lie, 1053g, 32% EFW   Prenatal History/Complications:  Subclinical hypothyroidism - no treatment in pregnancy per endocrinology in 09/20/2020 Graves Disease Anemia in Pregnancy  Past Medical History: Past Medical History:  Diagnosis Date   Constipation    Depression    Gluten intolerance    Hypothyroid    Keloid    Lactose intolerance    PAC (premature atrial contraction) 08/24/2016   Palpitations 07/01/2016   PVC (premature ventricular contraction) 08/24/2016   Retention of urine, unspecified    Seasonal allergies    Shortness of breath 07/01/2016   typically during allergy season   UTI (urinary tract infection)     Past Surgical History: History reviewed. No pertinent surgical history.  Obstetrical History: OB History     Gravida  2   Para  1   Term  1   Preterm  0   AB  0   Living  1      SAB  0   IAB  0   Ectopic  0   Multiple  0   Live Births  1           Social History Social History   Socioeconomic History   Marital status: Single    Spouse name: Not on file   Number of children: 1   Years of education: Not on file   Highest education level: Not on file  Occupational History   Occupation: unemployed  Tobacco Use   Smoking status: Never   Smokeless tobacco: Never  Vaping Use   Vaping Use: Never used  Substance and Sexual Activity   Alcohol use: No   Drug use: Not Currently   Sexual activity: Yes    Partners: Male    Birth  control/protection: None    Comment: Pregnant   Other Topics Concern   Not on file  Social History Narrative   10th grade (online)   Social Determinants of Health   Financial Resource Strain: Low Risk    Difficulty of Paying Living Expenses: Not hard at all  Food Insecurity: No Food Insecurity   Worried About 07/03/2016 in the Last Year: Never true   Ran Out of Food in the Last Year: Never true  Transportation Needs: No Transportation Needs   Lack of Transportation (Medical): No   Lack of Transportation (Non-Medical): No  Physical Activity: Inactive   Days of Exercise per Week: 0 days   Minutes of Exercise per Session: 0 min  Stress: Not on file  Social Connections: Not on file    Family History: Family History  Adopted: Yes  Problem Relation Age of Onset   Diabetes Mother    Seizures Mother    Hyperthyroidism Maternal Aunt    Celiac disease Neg Hx    Cholelithiasis Neg Hx    Ulcers Neg Hx     Allergies: Allergies  Allergen Reactions   Lactose Intolerance (Gi) Diarrhea and Other (See Comments)    Gi upset  Gluten Meal Hives, Diarrhea and Other (See Comments)    And abdominal pain   Macrobid [Nitrofurantoin Monohyd Macro] Hives and Rash    Medications Prior to Admission  Medication Sig Dispense Refill Last Dose   ferrous sulfate 325 (65 FE) MG tablet Take 1 tablet (325 mg total) by mouth every other day. 60 tablet 5 01/10/2021   Prenatal MV & Min w/FA-DHA (ONE A DAY PRENATAL PO) Take by mouth.   01/09/2021   albuterol (VENTOLIN HFA) 108 (90 Base) MCG/ACT inhaler Inhale 2 puffs into the lungs every 6 (six) hours as needed for wheezing or shortness of breath. 8 g 2      Review of Systems   All systems reviewed and negative except as stated in HPI  Blood pressure 107/89, pulse (!) 112, resp. rate 18, last menstrual period 03/16/2020, currently breastfeeding. General appearance: alert Lungs: clear to auscultation bilaterally Heart: regular rate and  rhythm Abdomen: soft, non-tender; bowel sounds normal Extremities: Homans sign is negative, no sign of DVT Presentation: cephalic Fetal monitoring Baseline: 140 bpm, Variability: Good {> 6 bpm), Accelerations: Reactive, and Decelerations: Absent Uterine activityFrequency: Every 1-2 minutes Dilation: 10 Effacement (%): 100 Station: 0 Exam by:: Dr Ephriam Jenkins   Prenatal labs: ABO, Rh: --/--/A POS (09/02 2144) Antibody: NEG (09/02 2144) Rubella: 3.07 (02/10 1336) RPR: Non Reactive (06/09 0957)  HBsAg: Negative (02/10 1336)  HIV: Non Reactive (06/09 0957)  GBS: Positive/-- (08/15 0224)  2 hr Glucola negative Genetic screening  NIPS LR, AFP negative Anatomy US normal  Prenatal Transfer Tool  Maternal Diabetes: No Genetic Screening: Normal Maternal Ultrasounds/Referrals: Normal Fetal Ultrasounds or other Referrals:  None Maternal Substance Abuse:  No Significant Maternal Medications:  Meds include: Other: IROn, PRN albuterol Significant Maternal Lab Results: Group B Strep positive  Results for orders placed or performed during the hospital encounter of 01/10/21 (from the past 24 hour(s))  Wet prep, genital   Collection Time: 01/10/21  7:35 PM   Specimen: Vaginal  Result Value Ref Range   Yeast Wet Prep HPF POC NONE SEEN NONE SEEN   Trich, Wet Prep NONE SEEN NONE SEEN   Clue Cells Wet Prep HPF POC PRESENT (A) NONE SEEN   WBC, Wet Prep HPF POC MANY (A) NONE SEEN   Sperm NONE SEEN   CBC   Collection Time: 01/10/21  9:44 PM  Result Value Ref Range   WBC 18.6 (H) 4.0 - 10.5 K/uL   RBC 3.94 3.87 - 5.11 MIL/uL   Hemoglobin 9.6 (L) 12.0 - 15.0 g/dL   HCT 74.9 (L) 44.9 - 67.5 %   MCV 78.7 (L) 80.0 - 100.0 fL   MCH 24.4 (L) 26.0 - 34.0 pg   MCHC 31.0 30.0 - 36.0 g/dL   RDW 91.6 38.4 - 66.5 %   Platelets 190 150 - 400 K/uL   nRBC 0.0 0.0 - 0.2 %  Type and screen   Collection Time: 01/10/21  9:44 PM  Result Value Ref Range   ABO/RH(D) A POS    Antibody Screen NEG    Sample  Expiration      01/13/2021,2359 Performed at [facility], 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 G2P1001 at [redacted]w[redacted]d here for active labor  #Labor:Presented in early labor, progressed from 2cm to 8cm, expectant management #Pain: IV fentanyl after #FWB: Cat I  #ID:  GBS +, not treated due to precipitous delivery #MOF: Bottle #MOC:Declines #Circ:  N/A, female infant  Warner Mccreedy, MD  01/10/2021, 10:45 PM

## 2021-01-10 NOTE — MAU Note (Signed)
Ashley Sparks is a 25 y.o. at [redacted]w[redacted]d here in MAU reporting: contractions since 1600, they are every 3-5 minutes. Denies bleeding. ? LOF, states she had a trickle while she was using the bathroom. DFM.  Onset of complaint: today

## 2021-01-10 NOTE — MAU Provider Note (Signed)
S: Ms. Ashley Sparks is a 25 y.o. G2P1001 at [redacted]w[redacted]d  who presents to MAU today complaining of leaking of fluid since 1600. She denies vaginal bleeding. She endorses contractions since 1600 also. She reports normal fetal movement.    O: BP 119/60   Pulse 93   Resp 18   LMP 03/16/2020  GENERAL: Well-developed, well-nourished female in no acute distress.  HEAD: Normocephalic, atraumatic.  CHEST: Normal effort of breathing, regular heart rate ABDOMEN: Soft, nontender, gravid PELVIC: Normal external female genitalia. Vagina is pink and rugated. Cervix with normal contour, no lesions. Thin watery, cream colored vaginal discharge trickling from introitus. (+) pooling.   Cervical exam:  Dilation: 2 Effacement (%): 80 Cervical Position: Posterior Station: -3 Exam by:: R.Anab Vivar,CNM   Fetal Monitoring: Baseline: 135 Variability: moderate Accelerations: present Decelerations: occ variables Contractions: regular every 4-5 minutes  Results for orders placed or performed during the hospital encounter of 01/10/21 (from the past 24 hour(s))  Wet prep, genital     Status: Abnormal   Collection Time: 01/10/21  7:35 PM   Specimen: Vaginal  Result Value Ref Range   Yeast Wet Prep HPF POC NONE SEEN NONE SEEN   Trich, Wet Prep NONE SEEN NONE SEEN   Clue Cells Wet Prep HPF POC PRESENT (A) NONE SEEN   WBC, Wet Prep HPF POC MANY (A) NONE SEEN   Sperm NONE SEEN     A: SIUP at [redacted]w[redacted]d  Membranes intact No leakage of amniotic fluid into vagina  Bacterial vaginosis  [redacted] weeks gestation of pregnancy   P: Discharge home Information provided on sign and symptoms of labor  Keep scheduled IOL appt on 01/17/21 Patient verbalized an understanding of the plan of care and agrees.    Raelyn Mora, CNM 01/10/2021, 8:49 PM

## 2021-01-10 NOTE — MAU Note (Signed)
Pt getting dressed to go home and her water broke. Moderate amount of meconium stained fluid. Notified provider and discharge cancelled

## 2021-01-10 NOTE — Telephone Encounter (Signed)
Preadmission screen  

## 2021-01-11 ENCOUNTER — Encounter (HOSPITAL_COMMUNITY): Payer: Self-pay | Admitting: Family Medicine

## 2021-01-11 LAB — CBC
HCT: 29.6 % — ABNORMAL LOW (ref 36.0–46.0)
Hemoglobin: 9.3 g/dL — ABNORMAL LOW (ref 12.0–15.0)
MCH: 24.7 pg — ABNORMAL LOW (ref 26.0–34.0)
MCHC: 31.4 g/dL (ref 30.0–36.0)
MCV: 78.5 fL — ABNORMAL LOW (ref 80.0–100.0)
Platelets: 168 10*3/uL (ref 150–400)
RBC: 3.77 MIL/uL — ABNORMAL LOW (ref 3.87–5.11)
RDW: 14.1 % (ref 11.5–15.5)
WBC: 21.1 10*3/uL — ABNORMAL HIGH (ref 4.0–10.5)
nRBC: 0 % (ref 0.0–0.2)

## 2021-01-11 LAB — RPR: RPR Ser Ql: NONREACTIVE

## 2021-01-11 MED ORDER — BENZOCAINE-MENTHOL 20-0.5 % EX AERO: 1.0000 "application " | INHALATION_SPRAY | CUTANEOUS | Status: DC | PRN

## 2021-01-11 MED ORDER — WITCH HAZEL-GLYCERIN EX PADS: 1.0000 "application " | MEDICATED_PAD | CUTANEOUS | Status: DC | PRN

## 2021-01-11 MED ORDER — MEDROXYPROGESTERONE ACETATE 150 MG/ML IM SUSP: 150.0000 mg | INTRAMUSCULAR | Status: DC | PRN

## 2021-01-11 MED ORDER — MEASLES, MUMPS & RUBELLA VAC IJ SOLR: 0.5000 mL | Freq: Once | INTRAMUSCULAR | Status: DC

## 2021-01-11 MED ORDER — PRENATAL MULTIVITAMIN CH
1.0000 | ORAL_TABLET | Freq: Every day | ORAL | Status: DC
  Administered 2021-01-11 – 2021-01-12 (×2): 1 via ORAL
  Filled 2021-01-11 (×2): qty 1

## 2021-01-11 MED ORDER — FERROUS SULFATE 325 (65 FE) MG PO TABS
325.0000 mg | ORAL_TABLET | ORAL | Status: DC
  Administered 2021-01-11: 325 mg via ORAL
  Filled 2021-01-11: qty 1

## 2021-01-11 MED ORDER — ACETAMINOPHEN 325 MG PO TABS
650.0000 mg | ORAL_TABLET | ORAL | Status: DC | PRN
  Administered 2021-01-11 – 2021-01-12 (×4): 650 mg via ORAL
  Filled 2021-01-11 (×4): qty 2

## 2021-01-11 MED ORDER — DIPHENHYDRAMINE HCL 25 MG PO CAPS: 25.0000 mg | ORAL_CAPSULE | Freq: Four times a day (QID) | ORAL | Status: DC | PRN

## 2021-01-11 MED ORDER — ONDANSETRON HCL 4 MG/2ML IJ SOLN: 4.0000 mg | INTRAMUSCULAR | Status: DC | PRN

## 2021-01-11 MED ORDER — COCONUT OIL OIL: 1.0000 "application " | TOPICAL_OIL | Status: DC | PRN

## 2021-01-11 MED ORDER — DIBUCAINE (PERIANAL) 1 % EX OINT: 1.0000 "application " | TOPICAL_OINTMENT | CUTANEOUS | Status: DC | PRN

## 2021-01-11 MED ORDER — SIMETHICONE 80 MG PO CHEW: 80.0000 mg | CHEWABLE_TABLET | ORAL | Status: DC | PRN

## 2021-01-11 MED ORDER — SENNOSIDES-DOCUSATE SODIUM 8.6-50 MG PO TABS
2.0000 | ORAL_TABLET | Freq: Every day | ORAL | Status: DC
  Administered 2021-01-11 – 2021-01-12 (×2): 2 via ORAL
  Filled 2021-01-11 (×2): qty 2

## 2021-01-11 MED ORDER — TETANUS-DIPHTH-ACELL PERTUSSIS 5-2.5-18.5 LF-MCG/0.5 IM SUSY: 0.5000 mL | PREFILLED_SYRINGE | Freq: Once | INTRAMUSCULAR | Status: DC

## 2021-01-11 MED ORDER — IBUPROFEN 600 MG PO TABS
600.0000 mg | ORAL_TABLET | Freq: Four times a day (QID) | ORAL | Status: DC
  Administered 2021-01-11 – 2021-01-12 (×7): 600 mg via ORAL
  Filled 2021-01-11 (×7): qty 1

## 2021-01-11 MED ORDER — ONDANSETRON HCL 4 MG PO TABS: 4.0000 mg | ORAL_TABLET | ORAL | Status: DC | PRN

## 2021-01-11 NOTE — Progress Notes (Signed)
Pt called stating she cannot order what she wants for breakfast/states she eats gluten and dairy at home/she knows what she can eat that doesn't upset her stomach so allergies corrected

## 2021-01-11 NOTE — Progress Notes (Addendum)
Post Partum Day 1 Subjective: no complaints, up ad lib, voiding, tolerating PO, and + flatus  Objective: Blood pressure 112/64, pulse 73, temperature 98.7 F (37.1 C), temperature source Oral, resp. rate 18, last menstrual period 03/16/2020, SpO2 100 %, unknown if currently breastfeeding.  Physical Exam:  General: alert, cooperative, and no distress Lochia: appropriate Uterine Fundus: firm DVT Evaluation: No evidence of DVT seen on physical exam.  Recent Labs    01/10/21 2144 01/11/21 0447  HGB 9.6* 9.3*  HCT 31.0* 29.6*    Assessment/Plan: Plan for discharge tomorrow and Contraception patient remains undecided, currently with no plan for contraception. Discussed options with her today, she prefers to defer decision.  #Acute on chronic anemia: EBL , Hgb 9.6>9.3. Asymptomatic. Continue home PO iron supplementation  #GBS pos: Inadequate treatment in setting of precipitous delivery. Pediatrics will likely want to observe baby for 48 hours postpartum.   LOS: 1 day   Dorothyann Gibbs 01/11/2021, 7:58 AM

## 2021-01-12 LAB — URINALYSIS, ROUTINE W REFLEX MICROSCOPIC
Bacteria, UA: NONE SEEN
Bilirubin Urine: NEGATIVE
Glucose, UA: NEGATIVE mg/dL
Ketones, ur: NEGATIVE mg/dL
Nitrite: NEGATIVE
Protein, ur: 100 mg/dL — AB
RBC / HPF: >50 RBC/hpf — ABNORMAL HIGH (ref 0–5)
Specific Gravity, Urine: 1.015 (ref 1.005–1.030)
WBC, UA: >50 WBC/hpf — ABNORMAL HIGH (ref 0–5)
pH: 6 (ref 5.0–8.0)

## 2021-01-12 MED ORDER — IBUPROFEN 600 MG PO TABS: 600.0000 mg | ORAL_TABLET | Freq: Four times a day (QID) | ORAL | 0 refills | Status: DC

## 2021-01-12 NOTE — Progress Notes (Signed)
CSW spoke with MOB via room # [phone] to complete consult for mental health concerns. CSW explained role, and reason for consult. MOB was pleasant, and polite during conversation with CSW. MOB reported, history of anxiety, and depression around the birth of her first child. MOB reported, history of Prozac, and her last dose being Jan 2021. MOB reported, since then, she has been able to manage symptoms without medication needed. CSW encourage MOB to implement healthy coping skills when symptoms arises.   CSW provided education regarding the baby blues period vs. perinatal mood disorders, discussed treatment and gave resources for mental health follow up if concerns arise. CSW recommends self- evaluation during the postpartum time period using the New Mom Checklist from Postpartum Progress and encouraged MOB to contact a medical professional if symptoms are noted at any time.   MOB reported, since delivery she feels,"fine". MOB reported, her mother is very supportive. MOB denied SI, HI, and DV when CSW assessed for safety.   MOB reported, there are no barriers to follow up infant's care. MOB reported, she has all essentials needed to care for infant. MOB reported, infant has a car seat, and crib. MOB denied any additional barriers.     CSW provided education on sudden infant death syndrome (SIDS).  CSW identifies no further need for intervention or barriers to discharge at this time.  Dolores Frame, MSW, LCSW-A Clinical Social Worker- Weekends [phone]

## 2021-01-12 NOTE — Discharge Summary (Signed)
Postpartum Discharge Summary      Patient Name: Ashley Sparks DOB: 10/15/1995 MRN: [id number]  Date of admission: 01/10/2021 Delivery date:01/10/2021  Delivering provider: Renard Matter  Date of discharge: 01/12/2021  Admitting diagnosis: [redacted] weeks gestation of pregnancy [Z3A.40] Intrauterine pregnancy: [redacted]w[redacted]d    Secondary diagnosis:  Active Problems:   Subclinical hyperthyroidism   Anemia complicating pregnancy in third trimester   Graves disease   [redacted] weeks gestation of pregnancy   Vaginal delivery  Additional problems: None   Discharge diagnosis: Term Pregnancy Delivered                                              Post partum procedures: None Augmentation: NOne Complications: None  Hospital course: Onset of Labor With Vaginal Delivery      25y.o. yo [id number] admitted in Active Labor on 01/10/2021. Patient progressed precipitously and had precipitous delivery of head. There was a nuchal cord present and few seconds of dystocia that was resolved with reducing the nuchal cord and suprapubic pressure. She otherwise had an uncomplicated labor course as follows:  Membrane Rupture Time/Date: 9:20 PM ,01/10/2021   Delivery Method:Vaginal, Spontaneous  Episiotomy: None  Lacerations:  None  Patient had an uncomplicated postpartum course.  She is ambulating, tolerating a regular diet, passing flatus, and urinating well. Patient is discharged home in stable condition on 01/12/21. Has mild Covid Sx. Discussed comfort measures. Red flags.   Newborn Data: Birth date:01/10/2021  Birth time:10:09 PM  Gender:Female  Living status:Living  Apgars:8 ,9  Weight:3651 g   Magnesium Sulfate received: No BMZ received: No Rhophylac:N/A MMR:N/A T-DaP:Given prenatally Flu: N/A Transfusion:No  Physical exam  Vitals:   01/11/21 0921 01/11/21 1721 01/11/21 2035 01/12/21 0624  BP: 103/66 100/66 (!) 103/55 (!) 90/57  Pulse: 77 85 81 80  Resp: 18 18 18 18   Temp: (!) 97.5 F (36.4 C) 98  F (36.7 C) 98 F (36.7 C) 98 F (36.7 C)  TempSrc: Oral Oral Oral Oral  SpO2: 93%  100% 100%   General: alert, cooperative, and no distress Lochia: appropriate Uterine Fundus: firm Incision: N/A DVT Evaluation: No evidence of DVT seen on physical exam. Labs: Lab Results  Component Value Date   WBC 21.1 (H) 01/11/2021   HGB 9.3 (L) 01/11/2021   HCT 29.6 (L) 01/11/2021   MCV 78.5 (L) 01/11/2021   PLT 168 01/11/2021   CMP Latest Ref Rng & Units 05/19/2020  Glucose 70 - 99 mg/dL 93  BUN 6 - 20 mg/dL 8  Creatinine 0.44 - 1.00 mg/dL 0.60  Sodium 135 - 145 mmol/L 135  Potassium 3.5 - 5.1 mmol/L 4.1  Chloride 98 - 111 mmol/L 103  CO2 22 - 32 mmol/L 19(L)  Calcium 8.9 - 10.3 mg/dL 9.7  Total Protein 6.5 - 8.1 g/dL 7.4  Total Bilirubin 0.3 - 1.2 mg/dL 1.1  Alkaline Phos 38 - 126 U/L 52  AST 15 - 41 U/L 22  ALT 0 - 44 U/L 19   Edinburgh Score: Edinburgh Postnatal Depression Scale Screening Tool 02/11/2017  I have been able to laugh and see the funny side of things. 0  I have looked forward with enjoyment to things. 0  I have blamed myself unnecessarily when things went wrong. 0  I have been anxious or worried for no good reason. 2  I have felt scared or panicky for no good reason. 0  Things have been getting on top of me. 0  I have been so unhappy that I have had difficulty sleeping. 0  I have felt sad or miserable. 2  I have been so unhappy that I have been crying. 0  The thought of harming myself has occurred to me. 0  Edinburgh Postnatal Depression Scale Total 4     After visit meds:  Allergies as of 01/12/2021       Reactions   Macrobid [nitrofurantoin Monohyd Macro] Hives, Rash        Medication List     TAKE these medications    albuterol 108 (90 Base) MCG/ACT inhaler Commonly known as: VENTOLIN HFA Inhale 2 puffs into the lungs every 6 (six) hours as needed for wheezing or shortness of breath.   ferrous sulfate 325 (65 FE) MG tablet Take 1 tablet (325 mg  total) by mouth every other day.   ibuprofen 600 MG tablet Commonly known as: ADVIL Take 1 tablet (600 mg total) by mouth every 6 (six) hours.   ONE A DAY PRENATAL PO Take by mouth.          Discharge home in stable condition Infant Feeding: Bottle Infant Disposition:home with mother Discharge instruction: per After Visit Summary and Postpartum booklet. Activity: Advance as tolerated. Pelvic rest for 6 weeks.  Diet: routine diet Future Appointments: No future appointments.  Follow up Visit:  Follow-up Information     [facility]. Go on 01/17/2021.   Specialty: Obstetrics and Gynecology Why: For induction Contact information: [address] [id number] [place] [phone]                Message sent to [facility] by Dr. Cy Blamer on 9/3  Please schedule this patient for a In person postpartum visit in 4 weeks with the following provider: MD. Additional Postpartum F/U: protect  None   High risk pregnancy complicated by:  thyroid disease and hx of PACs/PVCs Delivery mode:  Vaginal, Spontaneous  Anticipated Birth Control:  Unsure   01/12/2021 Manya Silvas, CNM

## 2021-01-14 ENCOUNTER — Encounter: Payer: Self-pay | Admitting: Hematology and Oncology

## 2021-01-14 LAB — URINE CULTURE

## 2021-01-16 ENCOUNTER — Encounter: Payer: Self-pay | Admitting: Hematology and Oncology

## 2021-01-17 ENCOUNTER — Inpatient Hospital Stay (HOSPITAL_COMMUNITY): Admission: AD | Admit: 2021-01-17 | Payer: Medicaid Other | Source: Home / Self Care | Admitting: Family Medicine

## 2021-01-17 ENCOUNTER — Inpatient Hospital Stay (HOSPITAL_COMMUNITY): Payer: Medicaid Other

## 2021-01-20 ENCOUNTER — Encounter: Payer: Self-pay | Admitting: Hematology and Oncology

## 2021-01-21 ENCOUNTER — Encounter: Payer: Self-pay | Admitting: Hematology and Oncology

## 2021-01-24 ENCOUNTER — Telehealth (HOSPITAL_COMMUNITY): Payer: Self-pay | Admitting: *Deleted

## 2021-01-24 NOTE — Telephone Encounter (Signed)
Left message to return nurse call.  Duffy Rhody, RN 01-24-2021 at 1:54pm

## 2021-01-24 NOTE — Telephone Encounter (Signed)
Left message to return nurse call.  Duffy Rhody, RN 01-24-2021 at 11:45am

## 2021-02-11 ENCOUNTER — Telehealth: Payer: Self-pay | Admitting: *Deleted

## 2021-02-11 ENCOUNTER — Telehealth: Payer: Medicaid Other | Admitting: Obstetrics and Gynecology

## 2021-02-11 DIAGNOSIS — Z91199 Patient's noncompliance with other medical treatment and regimen due to unspecified reason: Secondary | ICD-10-CM

## 2021-02-11 NOTE — Telephone Encounter (Signed)
TC to completed postpartum e-visit nurse portion. No answer. Left VM.  Second TC. No answer.

## 2021-02-12 NOTE — Progress Notes (Signed)
Pt was called twice with no pick up, video visit confirmed as no show.

## 2021-03-03 ENCOUNTER — Encounter: Payer: Self-pay | Admitting: Hematology and Oncology

## 2021-04-16 DIAGNOSIS — Z8619 Personal history of other infectious and parasitic diseases: Secondary | ICD-10-CM

## 2021-04-16 HISTORY — DX: Personal history of other infectious and parasitic diseases: Z86.19

## 2021-05-20 ENCOUNTER — Encounter: Payer: Self-pay | Admitting: Hematology and Oncology

## 2021-06-13 ENCOUNTER — Other Ambulatory Visit: Payer: Self-pay

## 2021-06-13 ENCOUNTER — Ambulatory Visit (INDEPENDENT_AMBULATORY_CARE_PROVIDER_SITE_OTHER): Payer: Medicaid Other

## 2021-06-13 ENCOUNTER — Ambulatory Visit (INDEPENDENT_AMBULATORY_CARE_PROVIDER_SITE_OTHER): Payer: Medicaid Other | Admitting: Obstetrics and Gynecology

## 2021-06-13 VITALS — BP 130/75 | HR 90 | Ht 61.0 in | Wt 152.4 lb

## 2021-06-13 DIAGNOSIS — Z3A08 8 weeks gestation of pregnancy: Secondary | ICD-10-CM

## 2021-06-13 DIAGNOSIS — Z3481 Encounter for supervision of other normal pregnancy, first trimester: Secondary | ICD-10-CM | POA: Diagnosis not present

## 2021-06-13 DIAGNOSIS — O021 Missed abortion: Secondary | ICD-10-CM | POA: Diagnosis not present

## 2021-06-13 DIAGNOSIS — O3680X0 Pregnancy with inconclusive fetal viability, not applicable or unspecified: Secondary | ICD-10-CM

## 2021-06-13 HISTORY — DX: Missed abortion: O02.1

## 2021-06-13 MED ORDER — GOJJI WEIGHT SCALE MISC: 1.0000 | 0 refills | Status: DC

## 2021-06-13 MED ORDER — BLOOD PRESSURE KIT DEVI: 1.0000 | 0 refills | Status: DC

## 2021-06-13 NOTE — Progress Notes (Signed)
Patient ID: Ashley Sparks, female   DOB: Oct 18, 1995, 26 y.o.   MRN: [id number] [name] is here for New OB intake. TVUS demonstrated IUP @ 8 weeks without cardiac activity. Confirmed by myself.   U/S findings reviewed with pt and partner. Tx options reviewed including conservative, Cytotec and D & C. Pt is undecided at this time Pt to call office on Monday  with discussion. Instructed to go to MAU for heavy bleeding and/or severe cramps.

## 2021-06-13 NOTE — Progress Notes (Signed)
° °  Performed Dating and viability scan on patient during New OB Intake. By provided LMP patient was dated at [redacted]w[redacted]d. U/S revealed a [redacted]w[redacted]d IUP with no fetal heart tones present.   Discussed findings with Dr. Alysia Penna who came in to rescan patient to confirm findings. Findings discussed with patient and FOB.

## 2021-06-16 ENCOUNTER — Other Ambulatory Visit: Payer: Self-pay | Admitting: Obstetrics and Gynecology

## 2021-06-16 ENCOUNTER — Other Ambulatory Visit: Payer: Self-pay

## 2021-06-16 ENCOUNTER — Encounter (HOSPITAL_BASED_OUTPATIENT_CLINIC_OR_DEPARTMENT_OTHER): Payer: Self-pay | Admitting: Obstetrics and Gynecology

## 2021-06-16 ENCOUNTER — Telehealth: Payer: Self-pay

## 2021-06-16 DIAGNOSIS — O021 Missed abortion: Secondary | ICD-10-CM

## 2021-06-16 NOTE — H&P (Signed)
Ashley Sparks is an 26 y.o. female P2 with an 8 week missed abortion here for scheduled dilatation and evacuation. Patient was diagnosed with a failed pregnancy by ultrasound on 05/13/21. She denies any vaginal bleeding and reports mild cramping.  Pertinent Gynecological History: Blood transfusions: none Last mammogram: n/a Last pap: normal Date: 01/2020 OB History: G3, P2002   Menstrual History: Patient's last menstrual period was 03/18/2021.    Past Medical History:  Diagnosis Date   Gluten intolerance    hives , diarrhea, abdominal pain   History of chlamydia 11/2018   History of COVID-19 01/2021   per pt mild symptoms during labor , that resolved   History of trichomoniasis 04/16/2021   and 07/ 2020   Lactose intolerance    MDD (major depressive disorder)    Missed ab 06/13/2021   PAC (premature atrial contraction) 08/24/2016   Palpitations 07/01/2016   cardiology work-up by dr t. Duke Salvia, in epic normal echo 07-17-2016, event monitor 07-07-2016  SR w/ no arrythmia's , occ  PACs;   last cardiology note w/ Dr Kirtland Bouchard. [place]obb 09-27-2020 released as needed basis   Seasonal allergies    Subclinical hyperthyroidism    endocrinology-- dr Lonzo Cloud;  per  lov note in epic 09-20-2020, secondary to graves's, w/ no extrathyroidal manifestations of gravee's, clinically euthyroid, gestational preg,  per pt released as needed basis    History reviewed. No pertinent surgical history.  Family History  Adopted: Yes  Problem Relation Age of Onset   Diabetes Mother    Seizures Mother    Hyperthyroidism Maternal Aunt    Celiac disease Neg Hx    Cholelithiasis Neg Hx    Ulcers Neg Hx     Social History:  reports that she has never smoked. She has never used smokeless tobacco. She reports that she does not drink alcohol and does not use drugs.  Allergies:  Allergies  Allergen Reactions   Gluten Meal Hives, Diarrhea and Other (See Comments)    And abdominal pain   Macrobid [Nitrofurantoin  Monohyd Macro] Hives and Rash    Medications Prior to Admission  Medication Sig Dispense Refill Last Dose   albuterol (VENTOLIN HFA) 108 (90 Base) MCG/ACT inhaler Inhale 2 puffs into the lungs every 6 (six) hours as needed for wheezing or shortness of breath. (Patient taking differently: Inhale 2 puffs into the lungs every 6 (six) hours as needed for wheezing or shortness of breath.) 8 g 2 Past Month   Prenatal MV & Min w/FA-DHA (ONE A DAY PRENATAL PO) Take by mouth daily.   06/16/2021    Review of Systems See pertinent in HPI. All other systems reviewed and non contributory Blood pressure (!) 141/89, pulse 90, temperature 99.1 F (37.3 C), temperature source Oral, resp. rate 16, height 5\' 1"  (1.549 m), weight 71.3 kg, last menstrual period 03/18/2021, SpO2 100 %, not currently breastfeeding. Physical Exam GENERAL: Well-developed, well-nourished female in no acute distress.  LUNGS: Clear to auscultation bilaterally.  HEART: Regular rate and rhythm. ABDOMEN: Soft, nontender, nondistended. No organomegaly. PELVIC: Deferred to OR EXTREMITIES: No cyanosis, clubbing, or edema, 2+ distal pulses.  No results found for this or any previous visit (from the past 24 hour(s)).  No results found.  Assessment/Plan: 26 yo with 8 week missed abortion here for D&E - Risks, benefits and alternatives were explained including but not limited to risks of bleeding, infection, uterine perforation and damage to adjacent organs - patient verbalized understanding and all questions were answered - Patient undecided  on regarding future contraception plans  Traniece Boffa 06/17/2021, 9:21 AM

## 2021-06-16 NOTE — Telephone Encounter (Signed)
Called patient, no answer, left voicemail with surgery date, time and preop instructions.  Surgery scheduled for 06/17/2021 at the [facility] at 10:30am, please arrive by 8:30am, nothing to eat or drink after midnight, no lotion, no powder, no perfume, no body oil and no makeup, remove contact lenses and all body jewelry and have someone available to drive you and pick you up. Left call back number.

## 2021-06-16 NOTE — Progress Notes (Signed)
Spoke w/ via phone for pre-op interview---pt Lab needs dos----  no (per anes)             Lab results------no COVID test -----patient states asymptomatic no test needed Arrive at ------- 0830 on 06-17-2021 NPO after MN NO Solid Food.  Clear liquids from MN until--- 0730 Med rec completed Medications to take morning of surgery ----- none Diabetic medication -----n/a Patient instructed no nail polish to be worn day of surgery Patient instructed to bring photo id and insurance card day of surgery Patient aware to have Driver (ride ) / caregiver for 24 hours after surgery --boyfriend, Ashley Sparks Patient Special Instructions ----- n/a Pre-Op special Istructions ----- case just added on today, orders pending Patient verbalized understanding of instructions that were given at this phone interview. Patient denies shortness of breath, chest pain, fever, cough at this phone interview.

## 2021-06-17 ENCOUNTER — Ambulatory Visit (HOSPITAL_BASED_OUTPATIENT_CLINIC_OR_DEPARTMENT_OTHER): Payer: Medicaid Other | Admitting: Anesthesiology

## 2021-06-17 ENCOUNTER — Encounter (HOSPITAL_BASED_OUTPATIENT_CLINIC_OR_DEPARTMENT_OTHER)
Admission: RE | Disposition: A | Discharge status: Home / Self Care | Payer: Self-pay | Source: Home / Self Care | Attending: Obstetrics and Gynecology

## 2021-06-17 ENCOUNTER — Encounter (HOSPITAL_BASED_OUTPATIENT_CLINIC_OR_DEPARTMENT_OTHER): Payer: Self-pay | Admitting: Obstetrics and Gynecology

## 2021-06-17 ENCOUNTER — Other Ambulatory Visit: Payer: Self-pay

## 2021-06-17 ENCOUNTER — Ambulatory Visit (HOSPITAL_BASED_OUTPATIENT_CLINIC_OR_DEPARTMENT_OTHER)
Admission: RE | Admit: 2021-06-17 | Discharge: 2021-06-17 | Disposition: A | Discharge status: Home / Self Care | Payer: Medicaid Other | Attending: Obstetrics and Gynecology | Admitting: Obstetrics and Gynecology

## 2021-06-17 DIAGNOSIS — E669 Obesity, unspecified: Secondary | ICD-10-CM | POA: Diagnosis not present

## 2021-06-17 DIAGNOSIS — O021 Missed abortion: Secondary | ICD-10-CM | POA: Diagnosis present

## 2021-06-17 HISTORY — DX: Major depressive disorder, single episode, unspecified: F32.9

## 2021-06-17 HISTORY — DX: Thyrotoxicosis, unspecified without thyrotoxic crisis or storm: E05.90

## 2021-06-17 HISTORY — PX: DILATION AND EVACUATION: SHX1459

## 2021-06-17 LAB — TYPE AND SCREEN
ABO/RH(D): A POS
Antibody Screen: NEGATIVE

## 2021-06-17 SURGERY — DILATION AND EVACUATION, UTERUS
Anesthesia: General | Site: Vagina

## 2021-06-17 MED ORDER — GLYCOPYRROLATE 0.2 MG/ML IJ SOLN
INTRAMUSCULAR | Status: DC | PRN
  Administered 2021-06-17: .1 mg via INTRAVENOUS

## 2021-06-17 MED ORDER — IBUPROFEN 600 MG PO TABS: 600.0000 mg | ORAL_TABLET | Freq: Four times a day (QID) | ORAL | 3 refills | Status: AC | PRN

## 2021-06-17 MED ORDER — ONDANSETRON HCL 4 MG/2ML IJ SOLN
INTRAMUSCULAR | Status: AC
  Filled 2021-06-17: qty 2

## 2021-06-17 MED ORDER — OXYCODONE-ACETAMINOPHEN 5-325 MG PO TABS: 1.0000 | ORAL_TABLET | Freq: Four times a day (QID) | ORAL | 0 refills | Status: AC | PRN

## 2021-06-17 MED ORDER — CEFAZOLIN SODIUM-DEXTROSE 2-4 GM/100ML-% IV SOLN
INTRAVENOUS | Status: AC
  Filled 2021-06-17: qty 100

## 2021-06-17 MED ORDER — KETOROLAC TROMETHAMINE 30 MG/ML IJ SOLN
INTRAMUSCULAR | Status: AC
  Filled 2021-06-17: qty 1

## 2021-06-17 MED ORDER — FENTANYL CITRATE (PF) 100 MCG/2ML IJ SOLN
INTRAMUSCULAR | Status: DC | PRN
Start: 2021-06-17 — End: 2021-06-17
  Administered 2021-06-17 (×2): 50 ug via INTRAVENOUS

## 2021-06-17 MED ORDER — FENTANYL CITRATE (PF) 100 MCG/2ML IJ SOLN: 25.0000 ug | INTRAMUSCULAR | Status: DC | PRN

## 2021-06-17 MED ORDER — ONDANSETRON HCL 4 MG/2ML IJ SOLN
INTRAMUSCULAR | Status: DC | PRN
Start: 2021-06-17 — End: 2021-06-17
  Administered 2021-06-17: 4 mg via INTRAVENOUS

## 2021-06-17 MED ORDER — ONDANSETRON HCL 4 MG/2ML IJ SOLN
4.0000 mg | Freq: Once | INTRAMUSCULAR | Status: AC | PRN
  Administered 2021-06-17: 4 mg via INTRAVENOUS

## 2021-06-17 MED ORDER — 0.9 % SODIUM CHLORIDE (POUR BTL) OPTIME
TOPICAL | Status: DC | PRN
  Administered 2021-06-17: 500 mL

## 2021-06-17 MED ORDER — ACETAMINOPHEN 325 MG PO TABS: 325.0000 mg | ORAL_TABLET | ORAL | Status: DC | PRN

## 2021-06-17 MED ORDER — DEXAMETHASONE SODIUM PHOSPHATE 10 MG/ML IJ SOLN
INTRAMUSCULAR | Status: AC
  Filled 2021-06-17: qty 1

## 2021-06-17 MED ORDER — KETOROLAC TROMETHAMINE 30 MG/ML IJ SOLN
INTRAMUSCULAR | Status: DC | PRN
Start: 2021-06-17 — End: 2021-06-17
  Administered 2021-06-17: 30 mg via INTRAVENOUS

## 2021-06-17 MED ORDER — OXYCODONE HCL 5 MG PO TABS
ORAL_TABLET | ORAL | Status: AC
  Filled 2021-06-17: qty 1

## 2021-06-17 MED ORDER — MIDAZOLAM HCL 5 MG/5ML IJ SOLN
INTRAMUSCULAR | Status: DC | PRN
  Administered 2021-06-17: 2 mg via INTRAVENOUS

## 2021-06-17 MED ORDER — OXYCODONE HCL 5 MG/5ML PO SOLN
5.0000 mg | Freq: Once | ORAL | Status: AC | PRN
Start: 2021-06-17 — End: 2021-06-17

## 2021-06-17 MED ORDER — LIDOCAINE HCL (CARDIAC) PF 100 MG/5ML IV SOSY
PREFILLED_SYRINGE | INTRAVENOUS | Status: DC | PRN
  Administered 2021-06-17: 60 mg via INTRAVENOUS

## 2021-06-17 MED ORDER — ACETAMINOPHEN 160 MG/5ML PO SOLN: 325.0000 mg | ORAL | Status: DC | PRN

## 2021-06-17 MED ORDER — POVIDONE-IODINE 10 % EX SWAB: 2.0000 "application " | Freq: Once | CUTANEOUS | Status: DC

## 2021-06-17 MED ORDER — GLYCOPYRROLATE PF 0.2 MG/ML IJ SOSY
PREFILLED_SYRINGE | INTRAMUSCULAR | Status: AC
  Filled 2021-06-17: qty 1

## 2021-06-17 MED ORDER — LACTATED RINGERS IV SOLN: INTRAVENOUS | Status: DC

## 2021-06-17 MED ORDER — PROPOFOL 10 MG/ML IV BOLUS
INTRAVENOUS | Status: AC
  Filled 2021-06-17: qty 20

## 2021-06-17 MED ORDER — CEFAZOLIN SODIUM-DEXTROSE 2-4 GM/100ML-% IV SOLN: 2.0000 g | INTRAVENOUS | Status: DC

## 2021-06-17 MED ORDER — DEXAMETHASONE SODIUM PHOSPHATE 4 MG/ML IJ SOLN
INTRAMUSCULAR | Status: DC | PRN
  Administered 2021-06-17: 8 mg via INTRAVENOUS

## 2021-06-17 MED ORDER — PROPOFOL 10 MG/ML IV BOLUS
INTRAVENOUS | Status: DC | PRN
Start: 2021-06-17 — End: 2021-06-17
  Administered 2021-06-17: 200 mg via INTRAVENOUS

## 2021-06-17 MED ORDER — MEPERIDINE HCL 25 MG/ML IJ SOLN: 6.2500 mg | INTRAMUSCULAR | Status: DC | PRN

## 2021-06-17 MED ORDER — LIDOCAINE HCL (PF) 2 % IJ SOLN
INTRAMUSCULAR | Status: AC
  Filled 2021-06-17: qty 5

## 2021-06-17 MED ORDER — DOXYCYCLINE HYCLATE 100 MG IV SOLR
200.0000 mg | INTRAVENOUS | Status: AC
  Administered 2021-06-17: 200 mg via INTRAVENOUS
  Filled 2021-06-17: qty 200

## 2021-06-17 MED ORDER — OXYCODONE HCL 5 MG PO TABS
5.0000 mg | ORAL_TABLET | Freq: Once | ORAL | Status: AC | PRN
Start: 2021-06-17 — End: 2021-06-17
  Administered 2021-06-17: 5 mg via ORAL

## 2021-06-17 MED ORDER — MIDAZOLAM HCL 2 MG/2ML IJ SOLN
INTRAMUSCULAR | Status: AC
  Filled 2021-06-17: qty 2

## 2021-06-17 MED ORDER — FENTANYL CITRATE (PF) 100 MCG/2ML IJ SOLN
INTRAMUSCULAR | Status: AC
  Filled 2021-06-17: qty 2

## 2021-06-17 SURGICAL SUPPLY — 24 items
CATH ROBINSON RED A/P 16FR (CATHETERS) IMPLANT
DECANTER SPIKE VIAL GLASS SM (MISCELLANEOUS) ×2 IMPLANT
DRSG TELFA 3X8 NADH (GAUZE/BANDAGES/DRESSINGS) ×2 IMPLANT
GAUZE 4X4 16PLY ~~LOC~~+RFID DBL (SPONGE) ×2 IMPLANT
GLOVE SURG POLYISO LF SZ6.5 (GLOVE) ×2 IMPLANT
GLOVE SURG UNDER POLY LF SZ6.5 (GLOVE) ×3 IMPLANT
GLOVE SURG UNDER POLY LF SZ7 (GLOVE) ×2 IMPLANT
GOWN STRL REUS W/TWL LRG LVL3 (GOWN DISPOSABLE) ×2 IMPLANT
KIT BERKELEY 1ST TRI 3/8 NO TR (MISCELLANEOUS) ×2 IMPLANT
KIT BERKELEY 1ST TRIMESTER 3/8 (MISCELLANEOUS) ×2 IMPLANT
KIT TURNOVER CYSTO (KITS) ×2 IMPLANT
NS IRRIG 1000ML POUR BTL (IV SOLUTION) ×1 IMPLANT
NS IRRIG 500ML POUR BTL (IV SOLUTION) ×1 IMPLANT
PACK VAGINAL MINOR WOMEN LF (CUSTOM PROCEDURE TRAY) ×2 IMPLANT
PAD DRESSING TELFA 3X8 NADH (GAUZE/BANDAGES/DRESSINGS) ×1 IMPLANT
PAD OB MATERNITY 4.3X12.25 (PERSONAL CARE ITEMS) ×2 IMPLANT
PAD PREP 24X48 CUFFED NSTRL (MISCELLANEOUS) ×2 IMPLANT
SET BERKELEY SUCTION TUBING (SUCTIONS) ×2 IMPLANT
TOWEL OR 17X26 10 PK STRL BLUE (TOWEL DISPOSABLE) ×2 IMPLANT
VACURETTE 10 RIGID CVD (CANNULA) ×1 IMPLANT
VACURETTE 6 ASPIR F TIP BERK (CANNULA) IMPLANT
VACURETTE 7MM CVD STRL WRAP (CANNULA) IMPLANT
VACURETTE 8 RIGID CVD (CANNULA) IMPLANT
VACURETTE 9 RIGID CVD (CANNULA) IMPLANT

## 2021-06-17 NOTE — Op Note (Signed)
Ashley Sparks PROCEDURE DATE: 06/17/2021  PREOPERATIVE DIAGNOSIS: 9 week missed abortion. POSTOPERATIVE DIAGNOSIS: The same. PROCEDURE:     Dilation and Evacuation. SURGEON:  Dr. Mora Bellman  INDICATIONS: 26 y.o. [id number] with MAB at [redacted] weeks gestation, needing surgical completion.  Risks of surgery were discussed with the patient including but not limited to: bleeding which may require transfusion; infection which may require antibiotics; injury to uterus or surrounding organs;need for additional procedures including laparotomy or laparoscopy; possibility of intrauterine scarring which may impair future fertility; and other postoperative/anesthesia complications. Written informed consent was obtained.    FINDINGS:  A 10 week-size anteverted uterus, moderate amounts of products of conception, specimen sent to pathology.  ANESTHESIA:    Monitored intravenous sedation INTRAVENOUS FLUIDS:  350 ml of LR ESTIMATED BLOOD LOSS:  Less than 20 ml. SPECIMENS:  Products of conception sent to pathology COMPLICATIONS:  None immediate.  PROCEDURE DETAILS:  The patient received intravenous antibiotics while in the preoperative area.  She was then taken to the operating room where general anesthesia was administered and was found to be adequate.  After an adequate timeout was performed, she was placed in the dorsal lithotomy position and examined; then prepped and draped in the sterile manner.   Her bladder was catheterized for an unmeasured amount of clear, yellow urine. A vaginal speculum was then placed in the patient's vagina and a single tooth tenaculum was applied to the anterior lip of the cervix.  A paracervical block using 0.5% Marcaine was administered. The cervix was gently dilated to accommodate a 10 mm suction curette that was gently advanced to the uterine fundus.  The suction device was then activated and curette slowly rotated to clear the uterus of products of conception.  A sharp curettage was  then performed to confirm complete emptying of the uterus. There was minimal bleeding noted and the tenaculum removed with good hemostasis noted.   All instruments were removed from the patient's vagina. The patient tolerated the procedure well and was taken to the recovery area awake, and in stable condition.  The patient will be discharged to home as per PACU criteria.  Routine postoperative instructions given.  She was prescribed Percocet, Ibuprofen and Colace.  She will follow up in the clinic in 2 weeks for postoperative evaluation.

## 2021-06-17 NOTE — Discharge Instructions (Signed)
  Post Anesthesia Home Care Instructions  Activity: Get plenty of rest for the remainder of the day. A responsible individual must stay with you for 24 hours following the procedure.  For the next 24 hours, DO NOT: -Drive a car -Operate machinery -Drink alcoholic beverages -Take any medication unless instructed by your physician -Make any legal decisions or sign important papers.  Meals: Start with liquid foods such as gelatin or soup. Progress to regular foods as tolerated. Avoid greasy, spicy, heavy foods. If nausea and/or vomiting occur, drink only clear liquids until the nausea and/or vomiting subsides. Call your physician if vomiting continues.  Special Instructions/Symptoms: Your throat may feel dry or sore from the anesthesia or the breathing tube placed in your throat during surgery. If this causes discomfort, gargle with warm salt water. The discomfort should disappear within 24 hours.  If you had a scopolamine patch placed behind your ear for the management of post- operative nausea and/or vomiting:  1. The medication in the patch is effective for 72 hours, after which it should be removed.  Wrap patch in a tissue and discard in the trash. Wash hands thoroughly with soap and water. 2. You may remove the patch earlier than 72 hours if you experience unpleasant side effects which may include dry mouth, dizziness or visual disturbances. 3. Avoid touching the patch. Wash your hands with soap and water after contact with the patch.      D & C Home care Instructions:   Personal hygiene:  Used sanitary napkins for vaginal drainage not tampons. Shower or tub bathe the day after your procedure. No douching until bleeding stops. Always wipe from front to back after  Elimination.  Activity: Do not drive or operate any equipment today. The effects of the anesthesia are still present and drowsiness may result. Rest today, not necessarily flat bed rest, just take it easy. You may resume your  normal activity in one to 2 days.  Sexual activity: No intercourse for one week or as indicated by your physician  Diet: Eat a light diet as desired this evening. You may resume a regular diet tomorrow.  Return to work: One to 2 days.  General Expectations of your surgery: Vaginal bleeding should be no heavier than a normal period. Spotting may continue up to 10 days. Mild cramps may continue for a couple of days. You may have a regular period in 2-6 weeks.  Unexpected observations call your doctor if these occur: persistent or heavy bleeding. Severe abdominal cramping or pain. Elevation of temperature greater than 100F.  Call for an appointment in one week.   

## 2021-06-17 NOTE — Anesthesia Procedure Notes (Signed)
Procedure Name: LMA Insertion Date/Time: 06/17/2021 10:42 AM Performed by: Cleda Clarks, CRNA Pre-anesthesia Checklist: Patient identified, Emergency Drugs available, Suction available and Patient being monitored Patient Re-evaluated:Patient Re-evaluated prior to induction Oxygen Delivery Method: Circle system utilized Preoxygenation: Pre-oxygenation with 100% oxygen Induction Type: IV induction Ventilation: Mask ventilation without difficulty LMA: LMA inserted LMA Size: 3.0 Number of attempts: 1 Placement Confirmation: positive ETCO2 Tube secured with: Tape Dental Injury: Teeth and Oropharynx as per pre-operative assessment

## 2021-06-17 NOTE — Anesthesia Preprocedure Evaluation (Addendum)
Anesthesia Evaluation  Patient identified by MRN, date of birth, ID band Patient awake    Reviewed: Allergy & Precautions, NPO status , Patient's Chart, lab work & pertinent test results  Airway Mallampati: I  TM Distance: >3 FB Neck ROM: Full    Dental no notable dental hx. (+) Teeth Intact   Pulmonary neg pulmonary ROS, shortness of breath and with exertion,    Pulmonary exam normal breath sounds clear to auscultation       Cardiovascular negative cardio ROS Normal cardiovascular exam Rhythm:Regular Rate:Normal     Neuro/Psych PSYCHIATRIC DISORDERS Depression negative neurological ROS     GI/Hepatic negative GI ROS, Neg liver ROS, GERD  ,  Endo/Other  negative endocrine ROSHypothyroidism Hyperthyroidism Obesity  Renal/GU negative Renal ROS  negative genitourinary   Musculoskeletal negative musculoskeletal ROS (+)   Abdominal (+) + obese,   Peds negative pediatric ROS (+)  Hematology negative hematology ROS (+) Blood dyscrasia, anemia ,   Anesthesia Other Findings   Reproductive/Obstetrics negative OB ROS                           Anesthesia Physical  Anesthesia Plan  ASA: 2  Anesthesia Plan: General   Post-op Pain Management:    Induction:   PONV Risk Score and Plan: 3 and Ondansetron, Dexamethasone and Treatment may vary due to age or medical condition  Airway Management Planned: Oral ETT and LMA  Additional Equipment:   Intra-op Plan:   Post-operative Plan:   Informed Consent: I have reviewed the patients History and Physical, chart, labs and discussed the procedure including the risks, benefits and alternatives for the proposed anesthesia with the patient or authorized representative who has indicated his/her understanding and acceptance.       Plan Discussed with: Anesthesiologist  Anesthesia Plan Comments:         Anesthesia Quick Evaluation

## 2021-06-17 NOTE — Transfer of Care (Signed)
Immediate Anesthesia Transfer of Care Note  Patient: Ashley Sparks  Procedure(s) Performed: DILATATION AND EVACUATION (Vagina )  Patient Location: PACU  Anesthesia Type:General  Level of Consciousness: awake, alert  and oriented  Airway & Oxygen Therapy: Patient Spontanous Breathing and Patient connected to nasal cannula oxygen  Post-op Assessment: Report given to RN and Post -op Vital signs reviewed and stable  Post vital signs: Reviewed and stable  Last Vitals:  Vitals Value Taken Time  BP 106/79 06/17/21 1130  Temp 36.1 C 06/17/21 1104  Pulse 76 06/17/21 1130  Resp 14 06/17/21 1131  SpO2 100 % 06/17/21 1130  Vitals shown include unvalidated device data.  Last Pain:  Vitals:   06/17/21 1120  TempSrc:   PainSc: 0-No pain      Patients Stated Pain Goal: 3 (06/17/21 0903)  Complications: No notable events documented.

## 2021-06-17 NOTE — Anesthesia Postprocedure Evaluation (Signed)
Anesthesia Post Note  Patient: Ashley Sparks  Procedure(s) Performed: DILATATION AND EVACUATION (Vagina )     Patient location during evaluation: PACU Anesthesia Type: General Level of consciousness: awake and alert Pain management: pain level controlled Vital Signs Assessment: post-procedure vital signs reviewed and stable Respiratory status: spontaneous breathing, nonlabored ventilation, respiratory function stable and patient connected to nasal cannula oxygen Cardiovascular status: blood pressure returned to baseline and stable Postop Assessment: no apparent nausea or vomiting Anesthetic complications: no   No notable events documented.  Last Vitals:  Vitals:   06/17/21 1130 06/17/21 1150  BP: 106/79 109/76  Pulse: 68 67  Resp: 16 18  Temp:  (!) 36.4 C  SpO2: 100% 100%    Last Pain:  Vitals:   06/17/21 1150  TempSrc:   PainSc: 6                  Angelly Spearing

## 2021-06-18 LAB — SURGICAL PATHOLOGY

## 2021-06-19 ENCOUNTER — Encounter (HOSPITAL_BASED_OUTPATIENT_CLINIC_OR_DEPARTMENT_OTHER): Payer: Self-pay | Admitting: Obstetrics and Gynecology

## 2021-06-19 ENCOUNTER — Encounter: Payer: Medicaid Other | Admitting: Obstetrics

## 2021-07-01 ENCOUNTER — Encounter: Payer: Medicaid Other | Admitting: Obstetrics and Gynecology

## 2021-12-16 ENCOUNTER — Emergency Department (HOSPITAL_COMMUNITY)
Admission: EM | Admit: 2021-12-16 | Discharge: 2021-12-16 | Disposition: A | Discharge status: Home / Self Care | Payer: Medicaid Other | Attending: Emergency Medicine | Admitting: Emergency Medicine

## 2021-12-16 ENCOUNTER — Other Ambulatory Visit: Payer: Self-pay

## 2021-12-16 ENCOUNTER — Ambulatory Visit (HOSPITAL_COMMUNITY)
Admission: EM | Admit: 2021-12-16 | Discharge: 2021-12-16 | Disposition: A | Discharge status: Home / Self Care | Payer: No Typology Code available for payment source | Source: Ambulatory Visit | Attending: Emergency Medicine | Admitting: Emergency Medicine

## 2021-12-16 ENCOUNTER — Encounter: Payer: Self-pay | Admitting: Hematology and Oncology

## 2021-12-16 ENCOUNTER — Other Ambulatory Visit (HOSPITAL_COMMUNITY): Payer: Self-pay

## 2021-12-16 DIAGNOSIS — T7421XA Adult sexual abuse, confirmed, initial encounter: Secondary | ICD-10-CM | POA: Diagnosis present

## 2021-12-16 DIAGNOSIS — Z0441 Encounter for examination and observation following alleged adult rape: Secondary | ICD-10-CM | POA: Insufficient documentation

## 2021-12-16 LAB — COMPREHENSIVE METABOLIC PANEL
ALT: 17 U/L (ref 0–44)
AST: 22 U/L (ref 15–41)
Albumin: 4 g/dL (ref 3.5–5.0)
Alkaline Phosphatase: 57 U/L (ref 38–126)
Anion gap: 8 (ref 5–15)
BUN: 8 mg/dL (ref 6–20)
CO2: 23 mmol/L (ref 22–32)
Calcium: 9.5 mg/dL (ref 8.9–10.3)
Chloride: 108 mmol/L (ref 98–111)
Creatinine, Ser: 0.76 mg/dL (ref 0.44–1.00)
GFR, Estimated: >60 mL/min (ref >60)
Glucose, Bld: 99 mg/dL (ref 70–99)
Potassium: 4.1 mmol/L (ref 3.5–5.1)
Sodium: 139 mmol/L (ref 135–145)
Total Bilirubin: 0.3 mg/dL (ref 0.3–1.2)
Total Protein: 7.4 g/dL (ref 6.5–8.1)

## 2021-12-16 LAB — HEPATITIS B SURFACE ANTIGEN: Hepatitis B Surface Ag: NONREACTIVE

## 2021-12-16 LAB — RPR: RPR Ser Ql: NONREACTIVE

## 2021-12-16 LAB — RAPID HIV SCREEN (HIV 1/2 AB+AG)
HIV 1/2 Antibodies: NONREACTIVE
HIV-1 P24 Antigen - HIV24: NONREACTIVE

## 2021-12-16 LAB — POC URINE PREG, ED: Preg Test, Ur: NEGATIVE

## 2021-12-16 LAB — HEPATITIS C ANTIBODY: HCV Ab: NONREACTIVE

## 2021-12-16 MED ORDER — ELVITEG-COBIC-EMTRICIT-TENOFAF 150-150-200-10 MG PO TABS
1.0000 | ORAL_TABLET | Freq: Every day | ORAL | 0 refills | Status: AC
  Filled 2021-12-16: qty 30, 30d supply, fill #0

## 2021-12-16 MED ORDER — LIDOCAINE HCL (PF) 1 % IJ SOLN
1.0000 mL | Freq: Once | INTRAMUSCULAR | Status: AC
  Administered 2021-12-16: 2 mL

## 2021-12-16 MED ORDER — PROMETHAZINE HCL 25 MG PO TABS
25.0000 mg | ORAL_TABLET | Freq: Four times a day (QID) | ORAL | Status: DC | PRN
  Administered 2021-12-16: 25 mg via ORAL

## 2021-12-16 MED ORDER — ELVITEG-COBIC-EMTRICIT-TENOFAF 150-150-200-10 MG PREPACK
1.0000 | ORAL_TABLET | Freq: Once | ORAL | Status: AC
  Administered 2021-12-16: 1 via ORAL
  Filled 2021-12-16: qty 1

## 2021-12-16 MED ORDER — AZITHROMYCIN 250 MG PO TABS
1000.0000 mg | ORAL_TABLET | Freq: Once | ORAL | Status: AC
  Administered 2021-12-16: 1000 mg via ORAL

## 2021-12-16 MED ORDER — METRONIDAZOLE 500 MG PO TABS
2000.0000 mg | ORAL_TABLET | Freq: Once | ORAL | Status: AC
  Administered 2021-12-16: 2000 mg via ORAL

## 2021-12-16 MED ORDER — ULIPRISTAL ACETATE 30 MG PO TABS
30.0000 mg | ORAL_TABLET | Freq: Once | ORAL | Status: AC
  Administered 2021-12-16: 30 mg via ORAL

## 2021-12-16 MED ORDER — ELVITEG-COBIC-EMTRICIT-TENOFAF 150-150-200-10 MG PO TABS: 1.0000 | ORAL_TABLET | Freq: Every day | ORAL | 0 refills | Status: DC

## 2021-12-16 MED ORDER — CEFTRIAXONE SODIUM 500 MG IJ SOLR
500.0000 mg | Freq: Once | INTRAMUSCULAR | Status: AC
  Administered 2021-12-16: 500 mg via INTRAMUSCULAR
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
## 2021-12-16 NOTE — Discharge Instructions (Signed)
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 medication take-back program. Check with your pharmacy or law enforcement to find a location. If you cannot return the medication, ask your care team how to get rid of this medication safely. NOTE: This sheet is a summary. It may not cover all possible information. If you have questions about this medicine, talk to your doctor, pharmacist, or health care provider.  2022 Elsevier/Gold Standard (2020-06-04 09:56:16)    Ulipristal Tablets  What is this medication? ULIPRISTAL (UE li pris tal) can prevent pregnancy. It should be taken as soon as possible in the 5 days (120 hours) after unprotected sex or if you think your contraceptive didn't work. It belongs to a group of medications called emergency contraceptives. It does not prevent HIV or other sexually transmitted  infections (STIs). This medicine may be used for other purposes; ask your health care provider or pharmacist if you have questions. COMMON BRAND NAME(S): ella What should I tell my care team before I take this medication? They need to know if you have any of these conditions: Liver disease An unusual or allergic reaction to ulipristal, other medications, foods, dyes, or preservatives Pregnant or trying to get pregnant Breast-feeding How should I use this medication? Take this medication by mouth with or without food. Your care team may want you to use a quick-response pregnancy test prior to using the tablets. Take your medication as soon as possible and not more than 5 days (120 hours) after the event. This medication can be taken at any time during your menstrual cycle. Follow the dose instructions of your care team exactly. Contact your care team right away if you vomit within 3 hours of taking your medication to discuss if you need to take another tablet. A patient package insert for the product will be given with each prescription and refill. Read this sheet carefully each time. The sheet may change frequently. Contact your care team about the use of this medication in children. Special care may be needed. Overdosage: If you think you have taken too much of this medicine contact a poison control center or emergency room at once. NOTE: This medicine is only for you. Do not share this medicine with others. What if I miss a dose? This medication is not for regular use. If you vomit within 3 hours of taking your dose, contact your care team for instructions. What may interact with this medication? This medication may interact with the following: Barbiturates such as phenobarbital or primidone Birth control pills Bosentan Carbamazepine Certain medications for fungal infections like griseofulvin, itraconazole, and ketoconazole Certain medications for HIV or AIDS or  hepatitis Dabigatran Digoxin Felbamate Fexofenadine Oxcarbazepine Phenytoin Rifampin St. John's Wort Topiramate This list may not describe all possible interactions. Give your health care provider a list of all the medicines, herbs, non-prescription drugs, or dietary supplements you use. Also tell them if you smoke, drink alcohol, or use illegal drugs. Some items may interact with your medicine. What should I watch for while using this medication? Your period may begin a few days earlier or later than expected. If your period is more than 7 days late, pregnancy is possible. See your care team as soon as you can and get a pregnancy test. Talk to your care team before taking this medication if you know or suspect that you are pregnant. Contact your care team if you think you may be pregnant and you have taken this medication. If you have severe abdominal pain about 3 to 5 weeks after taking this medication, you may have a pregnancy outside the womb, which is called an ectopic or tubal pregnancy. Call your care team or go to the nearest emergency room right away if you think this is happening. Discuss birth control options with your care team. Emergency birth control is not to be used routinely to prevent pregnancy. It should not be used more  than once in the same cycle. Birth control pills may not work properly while you are taking this medication. Wait at least 5 days after taking this medication to start or continue other hormone based birth control. Be sure to use a reliable barrier contraceptive method (such as a condom with spermicide) between the time you take this medication and your next period. This medication does not protect you against HIV infection (AIDS) or any other sexually transmitted diseases (STDs). What side effects may I notice from receiving this medication? Side effects that you should report to your care team as soon as possible: Allergic reactions-skin rash, itching, hives,  swelling of the face, lips, tongue, or throat Side effects that usually do not require medical attention (report to your care team if they continue or are bothersome): Dizziness Fatigue Headache Irregular menstrual cycles or spotting Menstrual cramps Nausea Stomach pain This list may not describe all possible side effects. Call your doctor for medical advice about side effects. You may report side effects to FDA at 1-800-FDA-1088.    Elvitegravir; Cobicistat; Emtricitabine; Tenofovir Alafenamide oral tablets  What is this medication? ELVITEGRAVIR; COBICISTAT; EMTRICITABINE; TENOFOVIR ALAFENAMIDE (el vye TEG ra veer; koe BIS i stat; em tri SIT uh bean; te NOE fo veer) is 3 antiretroviral medicines and a medication booster in 1 tablet. It is used to treat HIV. This medicine is not a cure for HIV. This medicine can lower, but not fully prevent, the risk of spreading HIV to others. This medicine may be used for other purposes; ask your health care provider or pharmacist if you have questions. COMMON BRAND NAME(S): Genvoya What should I tell my care team before I take this medication? They need to know if you have any of these conditions: kidney disease liver disease an unusual or allergic reaction to elvitegravir, cobicistat, emtricitabine, tenofovir, other medicines, foods, dyes, or preservatives pregnant or trying to get pregnant breast-feeding How should I use this medication? Take this medicine by mouth with a glass of water. Follow the directions on the prescription label. Take this medicine with food. Take your medicine at regular intervals. Do not take your medicine more often than directed. For your anti-HIV therapy to work as well as possible, take each dose exactly as prescribed. Do not skip doses or stop your medicine even if you feel better. Skipping doses may make the HIV virus resistant to this medicine and other medicines. Do not stop taking except on your doctor's advice. Talk  to your pediatrician regarding the use of this medicine in children. While this drug may be prescribed for selected conditions, precautions do apply. Overdosage: If you think you have taken too much of this medicine contact a poison control center or emergency room at once. NOTE: This medicine is only for you. Do not share this medicine with others. What if I miss a dose? If you miss a dose, take it as soon as you can. If it is almost time for your next dose, take only that dose. Do not take double or extra doses. What may interact with this medication? Do not take this medicine with any of the following medications: adefovir alfuzosin certain medicines for seizures like carbamazepine, phenobarbital, phenytoin cisapride irinotecan lumacaftor; ivacaftor lurasidone medicines for cholesterol like lovastatin, simvastatin medicines for headaches like dihydroergotamine, ergotamine, methylergonovine midazolam naloxegol other antiviral medicines for HIV or AIDS pimozide rifampin sildenafil St. John's wort triazolam This medicine may also interact with the following medications: antacids atorvastatin bosentan buprenorphine; naloxone certain antibiotics  like clarithromycin, telithromycin, rifabutin, rifapentine certain medications for anxiety or sleep like buspirone, clorazepate, diazepam, estazolam, flurazepam, zolpidem certain medicines for blood pressure or heart disease like amlodipine, diltiazem, felodipine, metoprolol, nicardipine, nifedipine, timolol, verapamil certain medicines for depression, anxiety, or psychiatric disturbances certain medicines for erectile dysfunction like avanafil, sildenafil, tadalafil, vardenafil certain medicines for fungal infection like itraconazole, ketoconazole, voriconazole certain medicines that treat or prevent blood clots like warfarin, apixaban, betrixaban, dabigatran, edoxaban, and rivaroxaban colchicine cyclosporine female hormones, like  estrogens and progestins and birth control pills medicines for infection like acyclovir, cidofovir, valacyclovir, ganciclovir, valganciclovir medicines for irregular heart beat like amiodarone, bepridil, digoxin, disopyramide, dofetilide, flecainide, lidocaine, mexiletine, propafenone, quinidine metformin oxcarbazepine phenothiazines like perphenazine, risperidone, thioridazine salmeterol sirolimus steroid medicines like betamethasone, budesonide, ciclesonide, dexamethasone, fluticasone, methylprednisolone, mometasone, triamcinolone tacrolimus This list may not describe all possible interactions. Give your health care provider a list of all the medicines, herbs, non-prescription drugs, or dietary supplements you use. Also tell them if you smoke, drink alcohol, or use illegal drugs. Some items may interact with your medicine. What should I watch for while using this medication? Visit your doctor or health care professional for regular check ups. Discuss any new symptoms with your doctor. You will need to have important blood work done while on this medicine. HIV is spread to others through sexual or blood contact. Talk to your doctor about how to stop the spread of HIV. If you have hepatitis B, talk to your doctor if you plan to stop this medicine. The symptoms of hepatitis B may get worse if you stop this medicine. Birth control pills may not work properly while you are taking this medicine. Talk to your doctor about using an extra method of birth control. Women who can still have children must use a reliable form of barrier contraception, like a condom. What side effects may I notice from receiving this medication? Side effects that you should report to your doctor or health care professional as soon as possible: allergic reactions like skin rash, itching or hives, swelling of the face, lips, or tongue breathing problems fast, irregular heartbeat muscle pain or weakness signs and symptoms of  kidney injury like trouble passing urine or change in the amount of urine signs and symptoms of liver injury like dark yellow or brown urine; general ill feeling or flu-like symptoms; light-colored stools; loss of appetite; right upper belly pain; unusually weak or tired; yellowing of the eyes or skin Side effects that usually do not require medical attention (report to your doctor or health care professional if they continue or are bothersome): diarrhea headache nausea tiredness This list may not describe all possible side effects. Call your doctor for medical advice about side effects. You may report side effects to FDA at 1-800-FDA-1088. Where should I keep my medication? Keep out of the reach of children. Store at room temperature below 30 degrees C (86 degrees F). Throw away any unused medicine after the expiration date. NOTE: This sheet is a summary. It may not cover all possible information. If you have questions about this medicine, talk to your doctor, pharmacist, or health care provider.  2022 Elsevier/Gold Standard (2019-03-28 17:54:51)    Promethazine (pack of 3 for home use) Also known as:  Phenergan  Promethazine Tablets  What is this medication? PROMETHAZINE (proe METH a zeen) prevents and treats the symptoms of an allergic reaction. It works by blocking histamine, a substance released by the body during an allergic reaction. It may also help  you relax, go to sleep, and relieve nausea, vomiting, or pain before or after procedures. It can also prevent and treat motion sickness. It works by helping your nervous system calm down by blocking substances in the body that may cause nausea and vomiting. It belongs to a group of medications called antihistamines. This medicine may be used for other purposes; ask your health care provider or pharmacist if you have questions. COMMON BRAND NAME(S): Phenergan What should I tell my care team before I take this medication? They need to  know if you have any of these conditions: Blockage in your bowel Diabetes Glaucoma Have trouble controlling your muscles Heart disease Liver disease Low blood counts, like low white cell, platelet, or red cell counts Lung or breathing disease, like asthma Parkinson's disease Prostate disease Seizures Stomach or intestine problems Trouble passing urine An unusual or allergic reaction to promethazine, sulfites, other medications, foods, dyes, or preservatives Pregnant or trying to get pregnant Breast-feeding How should I use this medication? Take this medication by mouth with a glass of water. Follow the directions on the prescription label. Take your doses at regular intervals. Do not take your medication more often than directed. Talk to your care team about the use of this medication in children. Special care may be needed. This medication should not be given to infants and children younger than 78 years old. Overdosage: If you think you have taken too much of this medicine contact a poison control center or emergency room at once. NOTE: This medicine is only for you. Do not share this medicine with others. What if I miss a dose? If you miss a dose, take it as soon as you can. If it is almost time for your next dose, take only that dose. Do not take double or extra doses. What may interact with this medication? Alcohol Antihistamines for allergy, cough, and cold Atropine Certain medications for anxiety or sleep Certain medications for bladder problems like oxybutynin, tolterodine Certain medications for depression like amitriptyline, fluoxetine, sertraline Certain medications for Parkinson's disease like benztropine, trihexyphenidyl Certain medications for stomach problems like dicyclomine, hyoscyamine Certain medications for travel sickness like scopolamine Epinephrine General anesthetics like halothane, isoflurane, methoxyflurane, propofol Ipratropium MAOIs like Marplan,  Nardil, and Parnate Medications for high blood pressure Medications for seizures like phenobarbital, primidone, phenytoin Medications that relax muscles for surgery Metoclopramide Narcotic medications for pain This list may not describe all possible interactions. Give your health care provider a list of all the medicines, herbs, non-prescription drugs, or dietary supplements you use. Also tell them if you smoke, drink alcohol, or use illegal drugs. Some items may interact with your medicine. What should I watch for while using this medication? Visit your care team for regular checks on your progress. Tell your care team if symptoms do not start to get better or if they get worse. You may get drowsy or dizzy. Do not drive, use machinery, or do anything that needs mental alertness until you know how this medication affects you. To reduce the risk of dizzy or fainting spells, do not stand or sit up quickly, especially if you are an older patient. Alcohol may increase dizziness and drowsiness. Avoid alcoholic drinks. Your mouth may get dry. Chewing sugarless gum or sucking hard candy, and drinking plenty of water may help. Contact your care team if the problem does not go away or is severe. This medication may cause dry eyes and blurred vision. If you wear contact lenses you may feel some  discomfort. Lubricating drops may help. See your care team if the problem does not go away or is severe. This medication can make you more sensitive to the sun. Keep out of the sun. If you cannot avoid being in the sun, wear protective clothing and use sunscreen. Do not use sun lamps or tanning beds/booths. This medication may increase blood sugar. Ask your care team if changes in diet or medications are needed if you have diabetes. What side effects may I notice from receiving this medication? Side effects that you should report to your care team as soon as possible: Allergic reactions-skin rash, itching, hives,  swelling of the face, lips, tongue, or throat CNS depression-slow or shallow breathing, shortness of breath, feeling faint, dizziness, confusion, trouble staying awake High fever stiff muscles, increased sweating, fast or irregular heartbeat, and confusion, which may be signs of neuroleptic malignant syndrome Infection-fever, chills, cough, or sore throat Liver injury-right upper belly pain, loss of appetite, nausea, light-colored stool, dark yellow or brown urine, yellowing skin or eyes, unusual weakness or fatigue Seizures Sudden eye pain or change in vision such as blurry vision, seeing halos around lights, vision loss Trouble passing urine Uncontrolled and repetitive body movements, muscle stiffness or spasms, tremors or shaking, loss of balance or coordination, restlessness, shuffling walk, which may be signs of extrapyramidal symptoms (EPS) Side effects that usually do not require medical attention (report to your care team if they continue or are bothersome): Confusion Constipation Dizziness Drowsiness Dry mouth Sensitivity to light Vivid dreams or nightmares This list may not describe all possible side effects. Call your doctor for medical advice about side effects. You may report side effects to FDA at 1-800-FDA-1088. Where should I keep my medication? Keep out of the reach of children. Store at room temperature, between 20 and 25 degrees C (68 and 77 degrees F). Protect from light. Throw away any unused medication after the expiration date. NOTE: This sheet is a summary. It may not cover all possible information. If you have questions about this medicine, talk to your doctor, pharmacist, or health care provider.  2022 Elsevier/Gold Standard (2020-08-06 10:57:27)
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
## 2022-05-15 ENCOUNTER — Ambulatory Visit
Admission: EM | Admit: 2022-05-15 | Discharge: 2022-05-15 | Disposition: A | Discharge status: Home / Self Care | Payer: Medicaid Other | Attending: Nurse Practitioner | Admitting: Nurse Practitioner

## 2022-05-15 ENCOUNTER — Encounter: Payer: Self-pay | Admitting: Hematology and Oncology

## 2022-05-15 DIAGNOSIS — L501 Idiopathic urticaria: Secondary | ICD-10-CM

## 2022-05-15 MED ORDER — METHYLPREDNISOLONE 4 MG PO TBPK: ORAL_TABLET | ORAL | 0 refills | Status: AC

## 2022-05-15 NOTE — Discharge Instructions (Signed)
Medrol Dosepak as prescribed Please follow-up with your PCP or allergist if symptoms persist Please go to the emergency room for any worsening symptoms

## 2022-05-15 NOTE — ED Provider Notes (Signed)
[facility]    CSN: [id number] Arrival date & time: 05/15/22  1608      History   Chief Complaint Chief Complaint  Patient presents with   Urticaria    HPI Ashley Sparks is a 27 y.o. female presents for evaluation of hives.  Patient reports over the past 2 weeks she has had intermittent break-up of pruritic hives on her arms, torso, legs.  Denies any tongue/lip/throat swelling or shortness of breath.  Denies any new medications, soaps, detergents.  No new environmental exposures.  No history of eczema or hives in the past.  Does endorse worsening stress.  She took Benadryl which does help the itching.  Reports has 1-2 episodes a week for the past 2 weeks.  She has no other concerns at this time.   Urticaria    Past Medical History:  Diagnosis Date   Gluten intolerance    hives , diarrhea, abdominal pain   History of chlamydia 11/2018   History of COVID-19 01/2021   per pt mild symptoms during labor , that resolved   History of trichomoniasis 04/16/2021   and 07/ 2020   Lactose intolerance    MDD (major depressive disorder)    Missed ab 06/13/2021   PAC (premature atrial contraction) 08/24/2016   Palpitations 07/01/2016   cardiology work-up by dr t. Duke Salvia, in epic normal echo 07-17-2016, event monitor 07-07-2016  SR w/ no arrythmia's , occ  PACs;   last cardiology note w/ Dr Kirtland Bouchard. [place] 09-27-2020 released as needed basis   Seasonal allergies    Subclinical hyperthyroidism    endocrinology-- dr Lonzo Cloud;  per  lov note in epic 09-20-2020, secondary to graves's, w/ no extrathyroidal manifestations of gravee's, clinically euthyroid, gestational preg,  per pt released as needed basis    Patient Active Problem List   Diagnosis Date Noted   Missed ab 06/13/2021   Obesity (BMI 30-39.9) 09/27/2020   Seasonal allergies    Lactose intolerance    Keloid    Hypothyroid    Gluten intolerance    Depression    Constipation    Graves disease 09/20/2020   Anemia  complicating pregnancy in third trimester 11/16/2016   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   PAC (premature atrial contraction) 08/24/2016   PVC (premature ventricular contraction) 08/24/2016   Subclinical hyperthyroidism 07/31/2016   Palpitations 07/01/2016   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 Authorizing Provider  methylPREDNISolone (MEDROL DOSEPAK) 4 MG TBPK tablet Take as prescribed 05/15/22  Yes Radford Pax, NP  albuterol (VENTOLIN HFA) 108 (90 Base) MCG/ACT inhaler Inhale 2 puffs into the lungs every 6 (six) hours as needed for wheezing or shortness of breath. Patient taking differently: Inhale 2  puffs into the lungs every 6 (six) hours as needed for wheezing or shortness of breath. 12/30/20   Radene Gunning, MD  elvitegravir-cobicistat-emtricitabine-tenofovir (GENVOYA) 150-150-200-10 MG TABS tablet Take 1 tablet by mouth daily with breakfast. 06/19/50   Campbell Stall P, DO  ibuprofen (ADVIL) 600 MG tablet Take 1 tablet (600 mg total) by mouth every 6 (six) hours as needed. 06/17/21   Constant, Peggy, MD  oxyCODONE-acetaminophen (PERCOCET/ROXICET) 5-325 MG tablet Take 1-2 tablets by mouth every 6 (six) hours as needed. 06/17/21   Constant, Peggy, MD  oxyCODONE-acetaminophen (PERCOCET/ROXICET) 5-325 MG tablet Take 1 tablet by mouth every 6 (six) hours as needed. 06/17/21   Constant, Peggy, MD  Prenatal MV & Min w/FA-DHA (ONE A DAY PRENATAL PO) Take  by mouth daily.    [provider]    Family History Family History  Adopted: Yes  Problem Relation Age of Onset   Diabetes Mother    Seizures Mother    Hyperthyroidism Maternal Aunt    Celiac disease Neg Hx    Cholelithiasis Neg Hx    Ulcers Neg Hx     Social History Social History   Tobacco Use   Smoking status: Every Day    Packs/day: 0.10    Types: Cigarettes   Smokeless tobacco: Never  Vaping Use   Vaping Use: Never used  Substance Use Topics   Alcohol use: No   Drug use: Never     Allergies   Gluten meal and Macrobid [nitrofurantoin monohyd macro]   Review of Systems Review of Systems  Skin:  Positive for rash.     Physical Exam Triage Vital Signs ED Triage Vitals  Enc Vitals Group     BP 05/15/22 1731 (!) 111/59     Pulse --      Resp 05/15/22 1731 15     Temp 05/15/22 1731 97.9 F (36.6 C)     Temp src --      SpO2 05/15/22 1731 99 %     Weight --      Height --      Head Circumference --      Peak Flow --      Pain Score 05/15/22 1729 0     Pain Loc --      Pain Edu? --      Excl. in [place]? --    No data found.  Updated Vital Signs BP (!) 111/59   Temp 97.9 F (36.6 C)   Resp 15   LMP 04/27/2022   SpO2 99%   Breastfeeding No   Visual Acuity Right Eye Distance:   Left Eye Distance:   Bilateral Distance:    Right Eye Near:   Left Eye Near:    Bilateral Near:     Physical Exam Vitals and nursing note reviewed.  Constitutional:      Appearance: Normal appearance.  HENT:     Head: Normocephalic and atraumatic.  Eyes:     Pupils: Pupils are equal, round, and reactive to light.  Cardiovascular:     Rate and Rhythm: Normal rate.  Pulmonary:     Effort: Pulmonary effort is normal.  Skin:    General: Skin is warm and dry.     Comments: Mild raised wheals on right torso.  No swelling or drainage.  Neurological:     General: No focal deficit present.     Mental Status: She is alert and oriented to person, place, and  time.  Psychiatric:  Mood and Affect: Mood normal.        Behavior: Behavior normal.      UC Treatments / Results  Labs (all labs ordered are listed, but only abnormal results are displayed) Labs Reviewed - No data to display  EKG   Radiology No results found.  Procedures Procedures (including critical care time)  Medications Ordered in UC Medications - No data to display  Initial Impression / Assessment and Plan / UC Course  I have reviewed the triage vital signs and the nursing notes.  Pertinent labs & imaging results that were available during my care of the patient were reviewed by me and considered in my medical decision making (see chart for details).     Reviewed exam and symptoms with patient.  No red flags on exam.  Discussed idiopathic urticaria Trial of Medrol Dosepak She is to take Benadryl OTC as needed Advised PCP or allergist follow-up if symptoms persist ER precautions reviewed and patient verbalized understanding Final Clinical Impressions(s) / UC Diagnoses   Final diagnoses:  Idiopathic urticaria     Discharge Instructions      Medrol Dosepak as prescribed Please follow-up with your PCP or allergist if symptoms persist Please go to the emergency room for any worsening symptoms   ED Prescriptions     Medication Sig Dispense Auth. Provider   methylPREDNISolone (MEDROL DOSEPAK) 4 MG TBPK tablet Take as prescribed 21 tablet Melynda Ripple, NP      PDMP not reviewed this encounter.   Melynda Ripple, NP 05/15/22 1758

## 2022-05-15 NOTE — ED Triage Notes (Signed)
Pt presents to [facility] with co of rash/ hives that are burning in nature for 2 weeks that comes and goes. Pt reports no otc medications or creams.

## 2022-05-18 ENCOUNTER — Encounter: Payer: Self-pay | Admitting: Hematology and Oncology

## 2023-06-30 ENCOUNTER — Ambulatory Visit
Admission: EM | Admit: 2023-06-30 | Discharge: 2023-06-30 | Disposition: A | Discharge status: Home / Self Care | Payer: Self-pay

## 2023-06-30 ENCOUNTER — Encounter: Payer: Self-pay | Admitting: Hematology and Oncology

## 2023-06-30 ENCOUNTER — Encounter (HOSPITAL_BASED_OUTPATIENT_CLINIC_OR_DEPARTMENT_OTHER): Payer: Self-pay

## 2023-06-30 ENCOUNTER — Other Ambulatory Visit: Payer: Self-pay

## 2023-06-30 ENCOUNTER — Emergency Department (HOSPITAL_BASED_OUTPATIENT_CLINIC_OR_DEPARTMENT_OTHER)
Admission: EM | Admit: 2023-06-30 | Discharge: 2023-06-30 | Disposition: A | Discharge status: Home / Self Care | Payer: Self-pay | Attending: Emergency Medicine | Admitting: Emergency Medicine

## 2023-06-30 ENCOUNTER — Emergency Department (HOSPITAL_BASED_OUTPATIENT_CLINIC_OR_DEPARTMENT_OTHER): Payer: Self-pay

## 2023-06-30 DIAGNOSIS — D72829 Elevated white blood cell count, unspecified: Secondary | ICD-10-CM | POA: Insufficient documentation

## 2023-06-30 DIAGNOSIS — Y92410 Unspecified street and highway as the place of occurrence of the external cause: Secondary | ICD-10-CM | POA: Diagnosis not present

## 2023-06-30 DIAGNOSIS — Z8616 Personal history of COVID-19: Secondary | ICD-10-CM | POA: Diagnosis not present

## 2023-06-30 DIAGNOSIS — R112 Nausea with vomiting, unspecified: Secondary | ICD-10-CM | POA: Insufficient documentation

## 2023-06-30 DIAGNOSIS — R1031 Right lower quadrant pain: Secondary | ICD-10-CM

## 2023-06-30 DIAGNOSIS — N898 Other specified noninflammatory disorders of vagina: Secondary | ICD-10-CM

## 2023-06-30 LAB — URINALYSIS, ROUTINE W REFLEX MICROSCOPIC
Bilirubin Urine: NEGATIVE
Glucose, UA: NEGATIVE mg/dL
Ketones, ur: NEGATIVE mg/dL
Leukocytes,Ua: NEGATIVE
Nitrite: NEGATIVE
Protein, ur: NEGATIVE mg/dL
Specific Gravity, Urine: 1.02 (ref 1.005–1.030)
pH: 8 (ref 5.0–8.0)

## 2023-06-30 LAB — BASIC METABOLIC PANEL
Anion gap: 8 (ref 5–15)
BUN: 10 mg/dL (ref 6–20)
CO2: 26 mmol/L (ref 22–32)
Calcium: 9 mg/dL (ref 8.9–10.3)
Chloride: 104 mmol/L (ref 98–111)
Creatinine, Ser: 0.66 mg/dL (ref 0.44–1.00)
GFR, Estimated: >60 mL/min (ref >60)
Glucose, Bld: 91 mg/dL (ref 70–99)
Potassium: 3.8 mmol/L (ref 3.5–5.1)
Sodium: 138 mmol/L (ref 135–145)

## 2023-06-30 LAB — CBC WITH DIFFERENTIAL/PLATELET
Abs Immature Granulocytes: 0.04 10*3/uL (ref 0.00–0.07)
Basophils Absolute: 0.1 10*3/uL (ref 0.0–0.1)
Basophils Relative: 1 %
Eosinophils Absolute: 0.1 10*3/uL (ref 0.0–0.5)
Eosinophils Relative: 1 %
HCT: 37.5 % (ref 36.0–46.0)
Hemoglobin: 12.7 g/dL (ref 12.0–15.0)
Immature Granulocytes: 0 %
Lymphocytes Relative: 23 %
Lymphs Abs: 3.2 10*3/uL (ref 0.7–4.0)
MCH: 30.8 pg (ref 26.0–34.0)
MCHC: 33.9 g/dL (ref 30.0–36.0)
MCV: 91 fL (ref 80.0–100.0)
Monocytes Absolute: 0.9 10*3/uL (ref 0.1–1.0)
Monocytes Relative: 7 %
Neutro Abs: 9.8 10*3/uL — ABNORMAL HIGH (ref 1.7–7.7)
Neutrophils Relative %: 68 %
Platelets: 279 10*3/uL (ref 150–400)
RBC: 4.12 MIL/uL (ref 3.87–5.11)
RDW: 12.3 % (ref 11.5–15.5)
WBC: 14.2 10*3/uL — ABNORMAL HIGH (ref 4.0–10.5)
nRBC: 0 % (ref 0.0–0.2)

## 2023-06-30 LAB — HCG, SERUM, QUALITATIVE: Preg, Serum: NEGATIVE

## 2023-06-30 LAB — PREGNANCY, URINE: Preg Test, Ur: NEGATIVE

## 2023-06-30 MED ORDER — METHOCARBAMOL 500 MG PO TABS: 500.0000 mg | ORAL_TABLET | Freq: Two times a day (BID) | ORAL | 0 refills | Status: AC

## 2023-06-30 MED ORDER — ETODOLAC 400 MG PO TABS: 400.0000 mg | ORAL_TABLET | Freq: Two times a day (BID) | ORAL | 0 refills | Status: AC

## 2023-06-30 MED ORDER — MORPHINE SULFATE (PF) 4 MG/ML IV SOLN
4.0000 mg | Freq: Once | INTRAVENOUS | Status: AC
  Administered 2023-06-30: 4 mg via INTRAVENOUS
  Filled 2023-06-30: qty 1

## 2023-06-30 MED ORDER — SODIUM CHLORIDE 0.9 % IV BOLUS
1000.0000 mL | Freq: Once | INTRAVENOUS | Status: AC
  Administered 2023-06-30: 1000 mL via INTRAVENOUS

## 2023-06-30 MED ORDER — ONDANSETRON HCL 4 MG/2ML IJ SOLN
4.0000 mg | Freq: Once | INTRAMUSCULAR | Status: AC
  Administered 2023-06-30: 4 mg via INTRAVENOUS
  Filled 2023-06-30: qty 2

## 2023-06-30 MED ORDER — IOHEXOL 300 MG/ML  SOLN
100.0000 mL | Freq: Once | INTRAMUSCULAR | Status: AC | PRN
  Administered 2023-06-30: 85 mL via INTRAVENOUS

## 2023-06-30 MED ORDER — METOCLOPRAMIDE HCL 5 MG/ML IJ SOLN
10.0000 mg | Freq: Once | INTRAMUSCULAR | Status: AC
  Administered 2023-06-30: 10 mg via INTRAVENOUS
  Filled 2023-06-30: qty 2

## 2023-06-30 MED ORDER — ONDANSETRON 4 MG PO TBDP: 4.0000 mg | ORAL_TABLET | Freq: Three times a day (TID) | ORAL | 0 refills | Status: AC | PRN

## 2023-06-30 NOTE — Discharge Instructions (Signed)
Your workup today was reassuring.  CT scan of the head and CT scan of the abdomen pelvis did not show any concerning findings.  Your blood work was reassuring.  I have sent nausea medicine, muscle relaxer and anti-inflammatory medication into the pharmacy for you.  The muscle relaxer will make you drowsy.  Do not drive or do anything else dangerous after taking this medicine.  Return for any concerning symptoms.  Otherwise establish care with a primary care provider of listed above.
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
## 2023-06-30 NOTE — ED Triage Notes (Signed)
"  My vaginal discharge is recurrent and concerned with it being BV for about 2-3 mos now with mild cramping and pain". "I want a swab for STI and BV".
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
## 2023-07-01 ENCOUNTER — Telehealth (HOSPITAL_COMMUNITY): Payer: Self-pay

## 2023-07-01 LAB — CERVICOVAGINAL ANCILLARY ONLY
Bacterial Vaginitis (gardnerella): POSITIVE — AB
Candida Glabrata: NEGATIVE
Candida Vaginitis: NEGATIVE
Chlamydia: NEGATIVE
Comment: NEGATIVE
Comment: NEGATIVE
Comment: NEGATIVE
Comment: NEGATIVE
Comment: NEGATIVE
Comment: NORMAL
Neisseria Gonorrhea: NEGATIVE
Trichomonas: NEGATIVE

## 2023-07-01 MED ORDER — METRONIDAZOLE 500 MG PO TABS: 500.0000 mg | ORAL_TABLET | Freq: Two times a day (BID) | ORAL | 0 refills | Status: AC

## 2023-07-01 NOTE — Telephone Encounter (Signed)
 Per protocol, pt requires tx with metronidazole. Rx sent to pharmacy on file.

## 2023-08-03 ENCOUNTER — Other Ambulatory Visit: Payer: Self-pay

## 2023-08-03 ENCOUNTER — Emergency Department (HOSPITAL_COMMUNITY)
Admission: EM | Admit: 2023-08-03 | Discharge: 2023-08-03 | Disposition: A | Discharge status: Home / Self Care | Payer: Self-pay | Attending: Emergency Medicine | Admitting: Emergency Medicine

## 2023-08-03 DIAGNOSIS — N760 Acute vaginitis: Secondary | ICD-10-CM | POA: Insufficient documentation

## 2023-08-03 DIAGNOSIS — J101 Influenza due to other identified influenza virus with other respiratory manifestations: Secondary | ICD-10-CM | POA: Insufficient documentation

## 2023-08-03 DIAGNOSIS — B9689 Other specified bacterial agents as the cause of diseases classified elsewhere: Secondary | ICD-10-CM | POA: Insufficient documentation

## 2023-08-03 DIAGNOSIS — R112 Nausea with vomiting, unspecified: Secondary | ICD-10-CM

## 2023-08-03 DIAGNOSIS — J111 Influenza due to unidentified influenza virus with other respiratory manifestations: Secondary | ICD-10-CM

## 2023-08-03 DIAGNOSIS — R197 Diarrhea, unspecified: Secondary | ICD-10-CM | POA: Insufficient documentation

## 2023-08-03 LAB — CBC WITH DIFFERENTIAL/PLATELET
Abs Immature Granulocytes: 0.03 10*3/uL (ref 0.00–0.07)
Basophils Absolute: 0 10*3/uL (ref 0.0–0.1)
Basophils Relative: 0 %
Eosinophils Absolute: 0 10*3/uL (ref 0.0–0.5)
Eosinophils Relative: 0 %
HCT: 38.2 % (ref 36.0–46.0)
Hemoglobin: 12.9 g/dL (ref 12.0–15.0)
Immature Granulocytes: 0 %
Lymphocytes Relative: 10 %
Lymphs Abs: 0.8 10*3/uL (ref 0.7–4.0)
MCH: 31.2 pg (ref 26.0–34.0)
MCHC: 33.8 g/dL (ref 30.0–36.0)
MCV: 92.5 fL (ref 80.0–100.0)
Monocytes Absolute: 0.9 10*3/uL (ref 0.1–1.0)
Monocytes Relative: 12 %
Neutro Abs: 6.2 10*3/uL (ref 1.7–7.7)
Neutrophils Relative %: 78 %
Platelets: 176 10*3/uL (ref 150–400)
RBC: 4.13 MIL/uL (ref 3.87–5.11)
RDW: 12.9 % (ref 11.5–15.5)
WBC: 7.9 10*3/uL (ref 4.0–10.5)
nRBC: 0 % (ref 0.0–0.2)

## 2023-08-03 LAB — COMPREHENSIVE METABOLIC PANEL
ALT: 18 U/L (ref 0–44)
AST: 23 U/L (ref 15–41)
Albumin: 3.7 g/dL (ref 3.5–5.0)
Alkaline Phosphatase: 47 U/L (ref 38–126)
Anion gap: 9 (ref 5–15)
BUN: 5 mg/dL — ABNORMAL LOW (ref 6–20)
CO2: 22 mmol/L (ref 22–32)
Calcium: 9.2 mg/dL (ref 8.9–10.3)
Chloride: 104 mmol/L (ref 98–111)
Creatinine, Ser: 0.66 mg/dL (ref 0.44–1.00)
GFR, Estimated: >60 mL/min (ref >60)
Glucose, Bld: 105 mg/dL — ABNORMAL HIGH (ref 70–99)
Potassium: 3.6 mmol/L (ref 3.5–5.1)
Sodium: 135 mmol/L (ref 135–145)
Total Bilirubin: 0.4 mg/dL (ref 0.0–1.2)
Total Protein: 7.1 g/dL (ref 6.5–8.1)

## 2023-08-03 LAB — URINALYSIS, ROUTINE W REFLEX MICROSCOPIC
Bilirubin Urine: NEGATIVE
Glucose, UA: NEGATIVE mg/dL
Ketones, ur: 20 mg/dL — AB
Nitrite: NEGATIVE
Protein, ur: 30 mg/dL — AB
Specific Gravity, Urine: 1.02 (ref 1.005–1.030)
Squamous Epithelial / HPF: >50 /HPF (ref 0–5)
pH: 6 (ref 5.0–8.0)

## 2023-08-03 LAB — WET PREP, GENITAL
Sperm: NONE SEEN
Trich, Wet Prep: NONE SEEN
WBC, Wet Prep HPF POC: <10 (ref <10)
Yeast Wet Prep HPF POC: NONE SEEN

## 2023-08-03 LAB — RESP PANEL BY RT-PCR (RSV, FLU A&B, COVID)  RVPGX2
Influenza A by PCR: POSITIVE — AB
Influenza B by PCR: NEGATIVE
Resp Syncytial Virus by PCR: NEGATIVE
SARS Coronavirus 2 by RT PCR: NEGATIVE

## 2023-08-03 LAB — GROUP A STREP BY PCR: Group A Strep by PCR: NOT DETECTED

## 2023-08-03 LAB — LIPASE, BLOOD: Lipase: 21 U/L (ref 11–51)

## 2023-08-03 LAB — HCG, SERUM, QUALITATIVE: Preg, Serum: NEGATIVE

## 2023-08-03 MED ORDER — ONDANSETRON 4 MG PO TBDP
4.0000 mg | ORAL_TABLET | Freq: Once | ORAL | Status: AC
  Administered 2023-08-03: 4 mg via ORAL

## 2023-08-03 MED ORDER — METRONIDAZOLE 500 MG PO TABS: 500.0000 mg | ORAL_TABLET | Freq: Two times a day (BID) | ORAL | 0 refills | Status: AC

## 2023-08-03 MED ORDER — SODIUM CHLORIDE 0.9 % IV BOLUS
1000.0000 mL | Freq: Once | INTRAVENOUS | Status: AC
  Administered 2023-08-03: 1000 mL via INTRAVENOUS

## 2023-08-03 MED ORDER — ONDANSETRON 4 MG PO TBDP
ORAL_TABLET | ORAL | Status: DC
Start: 2023-08-03 — End: 2023-08-03
  Filled 2023-08-03: qty 1

## 2023-08-03 MED ORDER — ONDANSETRON 4 MG PO TBDP: 4.0000 mg | ORAL_TABLET | Freq: Three times a day (TID) | ORAL | 0 refills | Status: AC | PRN

## 2023-08-03 MED ORDER — ACETAMINOPHEN 500 MG PO TABS
1000.0000 mg | ORAL_TABLET | Freq: Once | ORAL | Status: AC
  Administered 2023-08-03: 1000 mg via ORAL
  Filled 2023-08-03: qty 2

## 2023-08-03 MED ORDER — KETOROLAC TROMETHAMINE 30 MG/ML IJ SOLN
30.0000 mg | Freq: Once | INTRAMUSCULAR | Status: AC
  Administered 2023-08-03: 30 mg via INTRAVENOUS
  Filled 2023-08-03: qty 1

## 2023-08-03 MED ORDER — NAPROXEN 500 MG PO TABS: 500.0000 mg | ORAL_TABLET | Freq: Two times a day (BID) | ORAL | 0 refills | Status: AC

## 2023-08-03 NOTE — Discharge Instructions (Addendum)
 Your flu test was positive today.  This is likely the source of your symptoms.  I have written you for a medication to help with your pain as well as your nausea and vomiting.  You may also take additional Imodium over-the-counter.  You do have bacterial vaginosis on your swab.  I written you for a medication called Flagyl.  Do not drink alcohol while taking this medication over 48 hours afterwards or it can make you very sick.  Make sure to follow-up outpatient, return for any worsening symptoms

## 2023-08-03 NOTE — ED Provider Notes (Signed)
 [place] [facility] AT [facility] Provider Note   CSN: [id number] Arrival date & time: 08/03/23  1312     History  Chief Complaint  Patient presents with   Emesis   URI    Ashley Sparks is a 28 y.o. female here for evaluation of feeling unwell.  2 days ago developed some myalgias, cough, sore throat.  Yesterday developed nausea, vomiting, diarrhea.  Has been unable to keep down any medications or liquids.  She mid to diffuse myalgias.  No sick contacts.  Unknown if fever at home did not have any temperature.  No chest pain, shortness of breath, neck pain, neck stiffness, abdominal pain, dysuria, hematuria.  She states her last menstrual cycle is 5 days late.  She took a home pregnancy test yesterday which was negative.  No marijuana, EtOH use.  No blood in her stool or emesis.  HPI     Home Medications Prior to Admission medications   Medication Sig Start Date End Date Taking? Authorizing Provider  metroNIDAZOLE (FLAGYL) 500 MG tablet Take 1 tablet (500 mg total) by mouth 2 (two) times daily. 08/03/23  Yes Matthew Pais A, PA-C  naproxen (NAPROSYN) 500 MG tablet Take 1 tablet (500 mg total) by mouth 2 (two) times daily. 08/03/23  Yes Lezlie Ritchey A, PA-C  ondansetron (ZOFRAN-ODT) 4 MG disintegrating tablet Take 1 tablet (4 mg total) by mouth every 8 (eight) hours as needed. 08/03/23  Yes Anamaria Dusenbury A, PA-C  acetaminophen (TYLENOL) 325 MG tablet Take 650 mg by mouth every 6 (six) hours as needed.    [provider]  albuterol (VENTOLIN HFA) 108 (90 Base) MCG/ACT inhaler Inhale 2 puffs into the lungs every 6 (six) hours as needed for wheezing or shortness of breath. Patient taking differently: Inhale 2 puffs into the lungs every 6 (six) hours as needed for wheezing or shortness of breath. 12/30/20   Milas Hock, MD  elvitegravir-cobicistat-emtricitabine-tenofovir (GENVOYA) 150-150-200-10 MG TABS tablet Take 1 tablet by mouth daily with  breakfast. 12/16/21   Franne Forts, DO  etodolac (LODINE) 400 MG tablet Take 1 tablet (400 mg total) by mouth 2 (two) times daily. 06/30/23   Marita Kansas, PA-C  ibuprofen (ADVIL) 600 MG tablet Take 1 tablet (600 mg total) by mouth every 6 (six) hours as needed. 06/17/21   Constant, Peggy, MD  methocarbamol (ROBAXIN) 500 MG tablet Take 1 tablet (500 mg total) by mouth 2 (two) times daily. 06/30/23   Marita Kansas, PA-C  methylPREDNISolone (MEDROL DOSEPAK) 4 MG TBPK tablet Take as prescribed 05/15/22   Radford Pax, NP  ondansetron (ZOFRAN-ODT) 4 MG disintegrating tablet Take 1 tablet (4 mg total) by mouth every 8 (eight) hours as needed for nausea or vomiting. 06/30/23   Marita Kansas, PA-C  oxyCODONE-acetaminophen (PERCOCET/ROXICET) 5-325 MG tablet Take 1-2 tablets by mouth every 6 (six) hours as needed. 06/17/21   Constant, Peggy, MD  oxyCODONE-acetaminophen (PERCOCET/ROXICET) 5-325 MG tablet Take 1 tablet by mouth every 6 (six) hours as needed. 06/17/21   Constant, Peggy, MD  Prenatal MV & Min w/FA-DHA (ONE A DAY PRENATAL PO) Take by mouth daily.    [provider]      Allergies    Gluten meal, Nitrofurantoin macrocrystal, and Macrobid [nitrofurantoin monohyd macro]    Review of Systems   Review of Systems  Constitutional:  Positive for chills, fatigue and fever.  HENT:  Positive for congestion, rhinorrhea and sore throat.   Respiratory:  Positive for cough.  Cardiovascular: Negative.   Gastrointestinal:  Positive for diarrhea, nausea and vomiting. Negative for abdominal distention, abdominal pain, anal bleeding, blood in stool and constipation.  Genitourinary: Negative.   Musculoskeletal:  Positive for myalgias. Negative for neck pain and neck stiffness.  Skin: Negative.   Neurological: Negative.   All other systems reviewed and are negative.   Physical Exam Updated Vital Signs BP 123/72 (BP Location: Left Arm)   Pulse 100   Temp 98.7 F (37.1 C) (Oral)   Resp 20   Ht 5\' 1"  (1.549 m)    Wt 71.3 kg   LMP 06/22/2023   SpO2 100%   BMI 29.70 kg/m  Physical Exam Vitals and nursing note reviewed.  Constitutional:      General: She is not in acute distress.    Appearance: She is well-developed. She is not ill-appearing, toxic-appearing or diaphoretic.  HENT:     Head: Normocephalic and atraumatic.     Nose: Congestion and rhinorrhea present.     Mouth/Throat:     Lips: Pink.     Mouth: Mucous membranes are dry.     Pharynx: Oropharynx is clear. Uvula midline.     Comments: PO clear, uvula midline, no pooling of secretions Eyes:     Pupils: Pupils are equal, round, and reactive to light.  Neck:     Trachea: Trachea and phonation normal.     Comments: Full rom, no rigidity Cardiovascular:     Rate and Rhythm: Tachycardia present.     Pulses: Normal pulses.          Radial pulses are 2+ on the right side and 2+ on the left side.       Dorsalis pedis pulses are 2+ on the right side and 2+ on the left side.     Heart sounds: Normal heart sounds.  Pulmonary:     Effort: Pulmonary effort is normal. No respiratory distress.     Breath sounds: Normal breath sounds and air entry.     Comments: Clear bil speaks in full sentences without difficulty Chest:     Comments: No crepitus, nontender Abdominal:     General: Bowel sounds are normal. There is no distension.     Palpations: Abdomen is soft.     Tenderness: There is no abdominal tenderness. There is no right CVA tenderness, left CVA tenderness or guarding.     Comments: Soft, nontender, no rebound or guarding  Musculoskeletal:        General: Normal range of motion.     Cervical back: Full passive range of motion without pain and normal range of motion.     Comments: No bony tenderness, compartments soft, full range of motion  Skin:    General: Skin is warm and dry.     Comments: No obvious rashes or lesions on exposed skin  Neurological:     General: No focal deficit present.     Mental Status: She is alert.      Cranial Nerves: Cranial nerves 2-12 are intact.     Gait: Gait is intact.     Comments: Ambulatory in ED  Psychiatric:        Mood and Affect: Mood normal.     ED Results / Procedures / Treatments   Labs (all labs ordered are listed, but only abnormal results are displayed) Labs Reviewed  RESP PANEL BY RT-PCR (RSV, FLU A&B, COVID)  RVPGX2 - Abnormal; Notable for the following components:      Result Value  Influenza A by PCR POSITIVE (*)    All other components within normal limits  WET PREP, GENITAL - Abnormal; Notable for the following components:   Clue Cells Wet Prep HPF POC PRESENT (*)    All other components within normal limits  COMPREHENSIVE METABOLIC PANEL - Abnormal; Notable for the following components:   Glucose, Bld 105 (*)    BUN 5 (*)    All other components within normal limits  URINALYSIS, ROUTINE W REFLEX MICROSCOPIC - Abnormal; Notable for the following components:   Color, Urine AMBER (*)    APPearance CLOUDY (*)    Hgb urine dipstick MODERATE (*)    Ketones, ur 20 (*)    Protein, ur 30 (*)    Leukocytes,Ua SMALL (*)    Bacteria, UA MANY (*)    All other components within normal limits  GROUP A STREP BY PCR  LIPASE, BLOOD  CBC WITH DIFFERENTIAL/PLATELET  HCG, SERUM, QUALITATIVE  GC/CHLAMYDIA PROBE AMP ([place]) NOT AT [facility]    EKG None  Radiology No results found.  Procedures Procedures    Medications Ordered in ED Medications  ondansetron (ZOFRAN-ODT) 4 MG disintegrating tablet (has no administration in time range)  ondansetron (ZOFRAN-ODT) disintegrating tablet 4 mg (4 mg Oral Given 08/03/23 1337)  sodium chloride 0.9 % bolus 1,000 mL (0 mLs Intravenous Stopped 08/03/23 1617)  acetaminophen (TYLENOL) tablet 1,000 mg (1,000 mg Oral Given 08/03/23 1419)  ketorolac (TORADOL) 30 MG/ML injection 30 mg (30 mg Intravenous Given 08/03/23 1450)    ED Course/ Medical Decision Making/ A&P   28 yo here for evaluation of feeling unwell.  On  arrival she is febrile, tachycardic however is unable to keep down any antipyretics at home.  Her heart and lungs are clear.  Her abdomen is soft, tender.  No urinary symptoms.  No obvious rashes on exam.  Her posterior pharynx is clear I low suspicion for PTA, RPA, strep pharyngitis.  Suspect likely viral infection.  Will plan on labs, symptom management and reassess  Labs and imaging personally viewed and interpreted:  CBC without leukocytosis  metabolic panel without significant abnormality Lipase 21 Pregnancy test negative UA many bacteria however dirty catch.  She denies any UTI symptoms.  No concern for STI however does get frequent BV.  She elects to self swab.  Patient reassessed.  BV positive.  Started on Flagyl.  She is tolerating p.o. intake here.  Symptoms improved.  Suspect nausea vomiting likely in setting of influenza A.  She will follow-up outpatient, return for any worsening symptoms  The patient has been appropriately medically screened and/or stabilized in the ED. I have low suspicion for any other emergent medical condition which would require further screening, evaluation or treatment in the ED or require inpatient management.  Patient is hemodynamically stable and in no acute distress.  Patient able to ambulate in department prior to ED.  Evaluation does not show acute pathology that would require ongoing or additional emergent interventions while in the emergency department or further inpatient treatment.  I have discussed the diagnosis with the patient and answered all questions.  Pain is been managed while in the emergency department and patient has no further complaints prior to discharge.  Patient is comfortable with plan discussed in room and is stable for discharge at this time.  I have discussed strict return precautions for returning to the emergency department.  Patient was encouraged to follow-up with PCP/specialist refer to at discharge.  Medical Decision Making Amount and/or Complexity of Data Reviewed Independent Historian: friend External Data Reviewed: labs and notes. Labs: ordered. Decision-making details documented in ED Course.  Risk OTC drugs. Prescription drug management. Parenteral controlled substances. Decision regarding hospitalization. Diagnosis or treatment significantly limited by social determinants of health.           Final Clinical Impression(s) / ED Diagnoses Final diagnoses:  Influenza  Nausea vomiting and diarrhea  Bacterial vaginosis    Rx / DC Orders ED Discharge Orders          Ordered    ondansetron (ZOFRAN-ODT) 4 MG disintegrating tablet  Every 8 hours PRN        08/03/23 1753    naproxen (NAPROSYN) 500 MG tablet  2 times daily        08/03/23 1753    metroNIDAZOLE (FLAGYL) 500 MG tablet  2 times daily        08/03/23 1757              Nini Cavan A, PA-C 08/03/23 1759    Gwyneth Sprout, MD 08/07/23 2225

## 2023-08-03 NOTE — ED Triage Notes (Signed)
 Pt here with c/o with NVD, sore throat, HA. Started a couple of days ago and got worse last night. Having body aches and chills. No sick contacts. No CP SOB. Denies abd pain. Unable to keep PO intake down. Emesis x 8 today.

## 2023-08-04 LAB — GC/CHLAMYDIA PROBE AMP (~~LOC~~) NOT AT ARMC
Chlamydia: NEGATIVE
Comment: NEGATIVE
Comment: NORMAL
Neisseria Gonorrhea: NEGATIVE
